# Patient Record
Sex: Male | Born: 1955 | Race: White | Hispanic: No | Smoking: Current every day smoker
Health system: Southern US, Community
[De-identification: ages and names within clinical notes are randomized; demographics above are authoritative.]

## PROBLEM LIST (undated history)

## (undated) DIAGNOSIS — F1010 Alcohol abuse, uncomplicated: Secondary | ICD-10-CM

## (undated) DIAGNOSIS — K551 Chronic vascular disorders of intestine: Secondary | ICD-10-CM

## (undated) DIAGNOSIS — C801 Malignant (primary) neoplasm, unspecified: Secondary | ICD-10-CM

## (undated) DIAGNOSIS — Z720 Tobacco use: Secondary | ICD-10-CM

## (undated) DIAGNOSIS — Z95828 Presence of other vascular implants and grafts: Secondary | ICD-10-CM

## (undated) DIAGNOSIS — Z923 Personal history of irradiation: Secondary | ICD-10-CM

## (undated) HISTORY — DX: Chronic vascular disorders of intestine: K55.1

## (undated) HISTORY — DX: Presence of other vascular implants and grafts: Z95.828

## (undated) HISTORY — PX: CARDIAC SURGERY: SHX584

## (undated) HISTORY — DX: Tobacco use: Z72.0

---

## 2000-07-29 ENCOUNTER — Emergency Department (HOSPITAL_COMMUNITY): Admission: EM | Admit: 2000-07-29 | Discharge: 2000-07-29 | Payer: Self-pay | Admitting: *Deleted

## 2002-04-04 ENCOUNTER — Emergency Department (HOSPITAL_COMMUNITY): Admission: EM | Admit: 2002-04-04 | Discharge: 2002-04-04 | Payer: Self-pay | Admitting: Emergency Medicine

## 2002-04-04 ENCOUNTER — Encounter: Payer: Self-pay | Admitting: Emergency Medicine

## 2007-03-14 ENCOUNTER — Emergency Department (HOSPITAL_COMMUNITY): Admission: EM | Admit: 2007-03-14 | Discharge: 2007-03-14 | Payer: Self-pay | Admitting: Emergency Medicine

## 2007-07-18 ENCOUNTER — Inpatient Hospital Stay (HOSPITAL_COMMUNITY): Admission: EM | Admit: 2007-07-18 | Discharge: 2007-08-05 | Payer: Self-pay | Admitting: Emergency Medicine

## 2007-07-18 ENCOUNTER — Encounter (INDEPENDENT_AMBULATORY_CARE_PROVIDER_SITE_OTHER): Payer: Self-pay | Admitting: Emergency Medicine

## 2007-07-18 ENCOUNTER — Ambulatory Visit: Payer: Self-pay | Admitting: Infectious Diseases

## 2007-07-18 ENCOUNTER — Ambulatory Visit: Payer: Self-pay | Admitting: Vascular Surgery

## 2007-07-18 ENCOUNTER — Encounter: Payer: Self-pay | Admitting: Infectious Diseases

## 2007-07-29 ENCOUNTER — Encounter: Payer: Self-pay | Admitting: Vascular Surgery

## 2007-08-13 ENCOUNTER — Ambulatory Visit: Payer: Self-pay | Admitting: Vascular Surgery

## 2007-08-19 ENCOUNTER — Ambulatory Visit: Payer: Self-pay | Admitting: Internal Medicine

## 2007-08-20 ENCOUNTER — Ambulatory Visit: Payer: Self-pay | Admitting: Vascular Surgery

## 2007-08-20 ENCOUNTER — Ambulatory Visit: Payer: Self-pay | Admitting: *Deleted

## 2008-01-28 ENCOUNTER — Ambulatory Visit: Payer: Self-pay | Admitting: Vascular Surgery

## 2008-12-23 IMAGING — CR DG CHEST 1V PORT
1 series · 1 of 1 positions shown · non-contrast
Comparison: 07/20/07.

CLINICAL DATA: Swan-Ganz catheter placement/post aortic bypass.
 PORTABLE CHEST - 1 VIEW - 07/29/07:

[view not recorded]
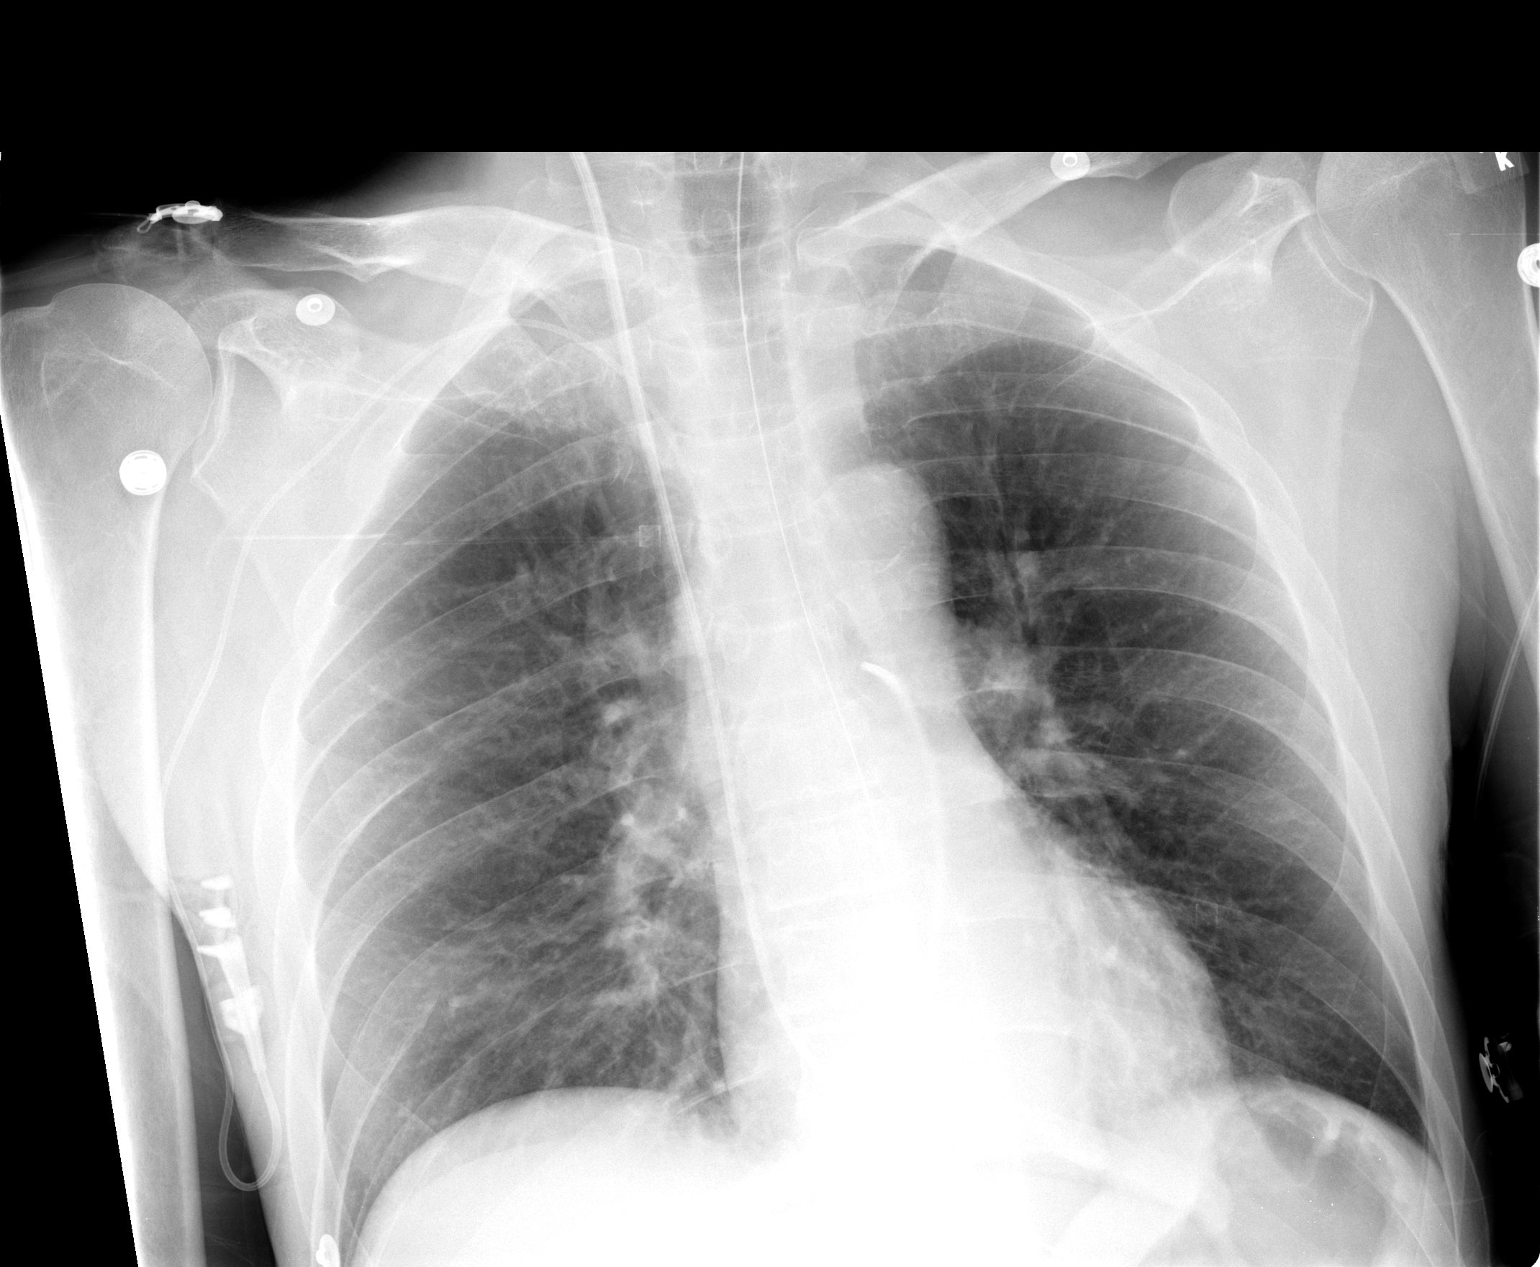

[1 of 1 positions shown; findings below may reference images not displayed]

FINDINGS: The heart and lungs remain normal.  A Swan-Ganz catheter has been placed from the right jugular vein. The tip is in the proximal right pulmonary artery.  A right upper extremity PICC is also in place with its tip in the proximal SVC.  
 No pneumothorax.  The lungs are clear.
IMPRESSION: 1.  Swan-Ganz catheter to the proximal right pulmonary artery.
 2.  No pneumothorax.
 3.  Lungs clear.

## 2008-12-24 IMAGING — CR DG CHEST 1V PORT
1 series · 1 of 1 positions shown · non-contrast
Comparison: 07/29/07.

CLINICAL DATA: AAA. Follow-up.
 PORTABLE CHEST - 1 VIEW (3023 hours):

[view not recorded]
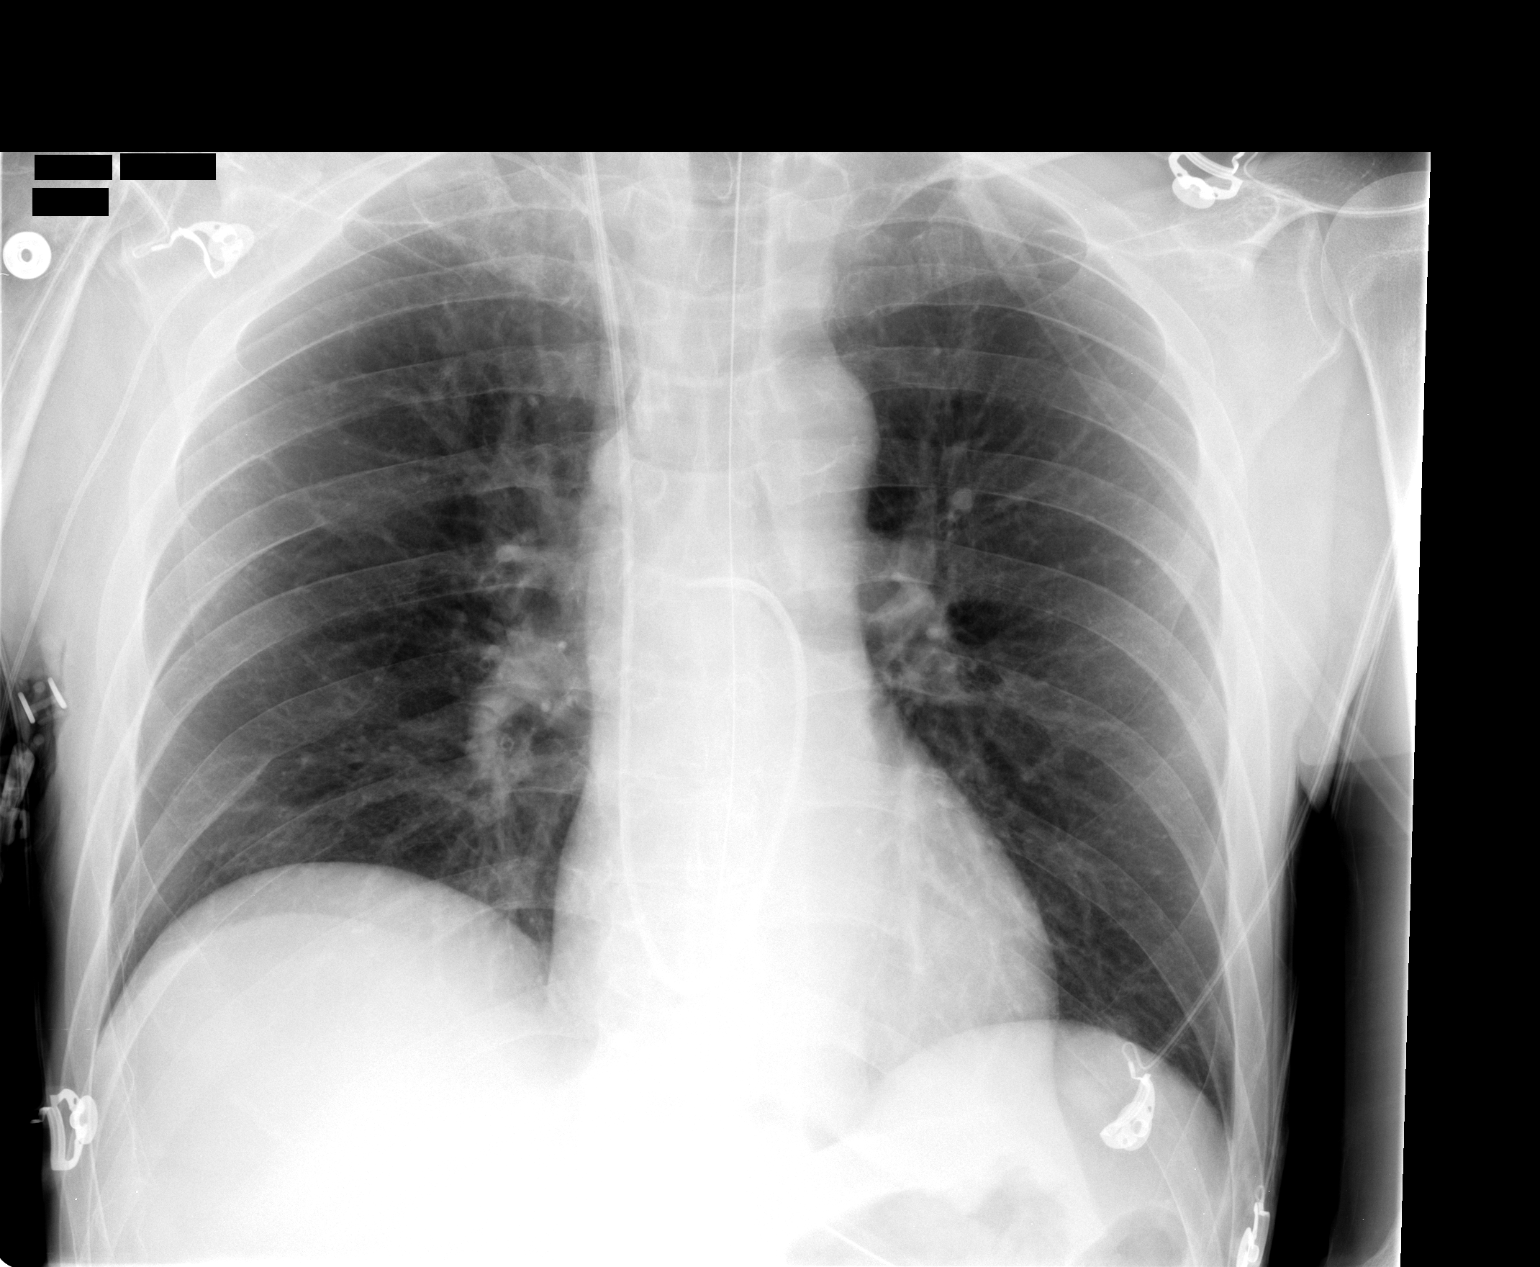

[1 of 1 positions shown; findings below may reference images not displayed]

FINDINGS: The lungs are well aerated. No pneumothorax is seen. Swan-Ganz catheter remains in the right main pulmonary artery. NG tube is present. No change in the right PICC line with the tip in the mid SVC.
IMPRESSION: Stable chest x-ray. No active lung disease. No change in Swan-Ganz catheter and right PICC line.

## 2010-10-24 NOTE — Op Note (Signed)
Brandon [name], VANDERWALL NO.:  [id number]   MEDICAL RECORD NO.:  [id number]          PATIENT TYPE:  INP   LOCATION:  4708                         FACILITY:  [facility]   PHYSICIAN:  Di Kindle. 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   PROCEDURES:  1. Aortogram.  2. Bilateral iliac arteriogram.  3. Bilateral lower extremity runoff.   SURGEON:  Di Kindle. Edilia Bo, M.D.   ANESTHESIA:  Local with sedation.   TECHNIQUE:  The patient was taken to the PV lab at [facility] and sedated with  a milligram of Versed and 50 mcg of fentanyl.  Both groins were prepped  and draped in the usual sterile fashion.  After the skin was infiltrated  with 1% lidocaine, the left common femoral artery was cannulated and a  guidewire introduced into the infrarenal the infrarenal aorta under  fluoroscopic control.  A 5-French sheath was introduced over the wire.  The dilator was removed.  A pigtail catheter was positioned at the L1  vertebral body and flush aortogram obtained.  The catheter was then  advanced 2 cm and a lateral projection was obtained and some oblique  projections were obtained of the proximal mesenteric and renal vessels.  Next the catheter was pulled down into the infrarenal aorta and  aortogram with runoff was obtained.   FINDINGS:  The celiac axis appears to be occluded at its origin and  there is reconstitution past this short-segment occlusion.  The superior  mesenteric artery has a proximal 90-95% stenosis over a length of  approximately 1.5 cm.  Distally there is  poststenotic dilatation and  distally the vessel was patent.  The IMA is not visualized.  Neither  hypogastric artery are visualized.  There is a 40-50% proximal left  renal artery stenosis with no significant renal artery stenosis on the  right.  The infrarenal aorta is patent.  The right common iliac artery  is occluded at its origin.  On the right side there is faint  visualization of the distal common femoral and proximal deep and  superficial femoral artery but no other visualization could be obtained  because of the proximal occlusion.   On the left side the common iliac and external iliac arteries are  patent.  The hypogastric artery is occluded on the left also.  The  superficial femoral artery is patent.  There is poor visualization of  the deep femoral artery.  There appears to be a focal plaque right at  the distal common femoral artery in the origin of the deep femoral  artery with extensive collaterals around this area, suggesting this is  chronic.  The superficial femoral artery, popliteal and proximal tibial  vessels are all patent.  The anterior tibial and  posterior tibial  arteries are patent in the foot.  There is poor visualization of the  peroneal distally.   CONCLUSIONS:  1. A 40-50% left renal artery stenosis.  2. 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  3. Short-segment occlusion of the celiac axis.  4. Occluded inferior mesenteric artery.  5. Bilateral hypogastric artery occlusions.  6. Focal plaque at the distal common femoral artery on the left.      Di Kindle. Edilia Bo, M.D.  Electronically Signed     CSD/MEDQ  D:  07/21/2007  T:  07/22/2007  Job:  [id number]

## 2010-10-24 NOTE — Assessment & Plan Note (Signed)
OFFICE VISIT   JAXTEN, BROSH  DOB:  [birth year]                                       08/20/2007  ZOXWR#:[id number]   Mr. Lamartina is seen in followup today.  He was last seen last week but  returned today to have bilateral ABIs performed today.  He recently  underwent aortobifemoral bypass with SMA bypass on February 17th.  He  states that he is continuing to do well.  His appetite has improved  somewhat.  He has no postprandial pain but his appetite is not large to  this point.  He is continuing to eat.  ABIs today were 0.92 on the right  and 0.87 on the left.  Overall, he is doing well.  He will follow up  with me in 6 months' time for repeat SMA graft surveillance and ABIs.   Janetta Hora. Fields, MD  Electronically Signed   CEF/MEDQ  D:  08/20/2007  T:  08/21/2007  Job:  [id number]

## 2010-10-24 NOTE — Assessment & Plan Note (Signed)
OFFICE VISIT   DOZIER, BERKOVICH  DOB:  08-02-1955                                       01/28/2008  ZOXWR#:[id number]   The patient is a 55 year old male who underwent aortobifemoral bypass  with aorta superior mesenteric artery bypass in February 2009.  He  presents for further follow-up today.  He denies any abdominal pain.  He  states that he really has not gained significantly weight and currently  weighs 139 pounds.  He denies any postprandial pain.  He states that he  is walking without difficulty and has essentially no claudication  symptoms.  Unfortunately, he continues to smoke 1 pack of cigarettes per  day.  He was counseled again today against this.  He also states that he  has discontinued all of his medications, I believe that most likely this  is due to financial issues.   PHYSICAL EXAM TODAY:  vital Signs:  Blood pressure is 179/119 in the  left arm, heart rate 71 and regular.  HEENT:  Unremarkable.  Neck:  Has  2+ carotid pulses without bruit.  Chest:  Clear to auscultation.  Cardiac Exam:  Regular rate and rhythm without murmur.  Abdomen:  Soft  and nontender with no bruits or masses.  Extremities:  He has 2+ femoral  pulses bilaterally, he has 2+ dorsalis pedis pulses bilaterally.  He had  bilateral ABIs performed today which were 0.92 on the right and 0.87 on  the left.  He also had a duplex of his aortic and SMA bypass grafts.  The aortobifemoral bypass graft is widely patent as well as the SMA  bypass graft.  There is no evidence of stenosis.  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   I encouraged the patient that he should resume his Pravastatin and his  lisinopril to hopefully control these risk factors and ensure the long-  term patency of his bypass graft.  Additionally, I again counseled him  against smoking.  He will follow up in six months' time for a repeat  graft scan.  He will  walk as much as possible to improve circulation in  his lower extremities.   Janetta Hora. Fields, MD  Electronically Signed   CEF/MEDQ  D:  01/29/2008  T:  01/29/2008  Job:  [id number]   cc:   Melvern Banker

## 2010-10-24 NOTE — Op Note (Signed)
Brandon [name], Brandon [name] NO.:  [id number]   MEDICAL RECORD NO.:  [id number]          PATIENT TYPE:  INP   LOCATION:  2019                         FACILITY:  [facility]   PHYSICIAN:  Janetta Hora. Fields, MD  DATE OF BIRTH:  1956-04-16   DATE OF PROCEDURE:  07/29/2007  DATE OF DISCHARGE:  08/05/2007                               OPERATIVE REPORT   PROCEDURE:  1. Aortobifemoral bypass.  2. Aorta to the superior mesenteric artery bypass.   PREOPERATIVE DIAGNOSES:  1. Claudication with early rest pain, right foot.  2. Chronic mesenteric ischemia.   POSTOPERATIVE DIAGNOSES:  1. Claudication with early rest pain, right foot.  2. Chronic mesenteric ischemia.   ANESTHESIA:  General.   ASSISTANT:  Shonna Chock, P.A.-C., Evern Bio, P.A.-C.   OPERATIVE FINDINGS:  1. A 14 x 8-mm Dacron bifurcated graft for aortobifemoral bypass.  2. An 8-mm Dacron graft from aortic graft to superior mesenteric      artery.   OPERATIVE DETAILS:  After obtaining informed consent, the patient was  taken to the operating room.  The patient was placed in the supine  position on the operating room table.  After induction of general  anesthesia and endotracheal intubation, the patient's abdomen and lower  extremity were prepped and draped in the usual sterile fashion.  Next, a  longitudinal incision was made over the right common femoral artery in  the right groin.  Incision was carried down through subcutaneous  tissues, down to the level of the artery.  The artery was fairly small,  and there was no pulse present.  The common femoral artery was fairly  long.  This was dissected free circumferentially.  Dissection was  carried down to the level of femoral bifurcation.  Vessel loops were  then placed around the artery.  Retroperitoneal tunnel was started in  the right groin by tunneling up onto the inguinal ligament into the  external iliac artery area.   Attention was then turned to the  left groin.  A longitudinal incision  was made over the left common femoral artery in a similar fashion.  There was also a long common femoral artery on this side.  A pulse was  present on the left side.  The artery was again dissected free  circumferentially, and retroperitoneal tunnel was begun just underneath  the inguinal ligament.  A midline laparotomy incision was then made  extending from the xiphoid to the pubis.  Incision was carried down  through subcutaneous tissues.  The fascia peritoneum were incised for  the full length of the incision.  Next, a small bowel was inspected and  found to be viable with no areas of obvious ischemia.  Small bowel was  reflected to the right colon and the transverse colon and omentum were  reflected superiorly.  An Omni retractor was brought up in the operative  field for assistance in retraction.  The retroperitoneum was entered.  The aorta was dissected free on its anterior surface all the way up to  the level of the left and right renal arteries.  The inferior mesenteric  artery was dissected free circumferentially and a vessel loop placed  around this.  Retroperitoneal tunnels were then made connecting the  abdominal incision retroperitoneal and retroureteral down to the groin  incisions on both sides, and umbilical tapes were placed through these  retroperitoneal tunnels.  The patient was then given 5000 units of  intravenous heparin.  The aorta was clamped in a side-biting fashion  just below the renal arteries.  Longitudinal aortotomy was made.  A 14 x  8-mm Dacron graft was then brought up in the operative field and  beveled.  This was then sewn end of graft to side of aorta using a  running 3-0 Prolene suture.  Just prior to completion of anastomosis,  this was fore bled, back bled and thoroughly flushed.  Anastomosis was  secured.  Clamp was released restoring flow to the native aortic  circulation distally.  There was a brief pressure  drop, and this quickly  recovered.  It should be noted that the inferior mesenteric artery was  left in place and was a few centimeters below the proximal aortic  anastomosis.  Next, the left and right limbs of the graft were brought  down through the retroperitoneal tunnels to each groin.  Attention was  then turned to the right common femoral anastomosis.  Right common  femoral artery was clamped proximally with a small Cooley clamp and  distally with a fistula clamp.  A longitudinal opening was made in the  right common femoral artery.  The artery was fairly soft in character.  Graft was pulled taut to length and beveled.  It was then sewn end graft  to side of artery using a running 5-0 Prolene suture.  Just prior to  completion of anastomosis was fore bled, back bled, and thoroughly  flushed.  Anastomosis was secured.  Clamp was released.  Flow was  restored to the right leg.  There was a transient brief pressure drop,  and this quickly recovered.  Attention was then turned to the left leg.  The left limb of the graft had already been brought through the  retroperitoneal tunnel.  The left common femoral artery was clamped  proximally and distally as on the right side.  A longitudinal opening  was made in the left common femoral artery.  This was also was fairly  smooth in character.  The graft was beveled and cut to length.  This was  then sewn end graft to side of artery using running 5-0 Prolene suture.  In a similar fashion, this was fore bled, back bled, and thoroughly  flushed.  After anastomosis was secured, clamp was released.  There was  a brief pressure drop which quickly recovered.  Hemostasis was obtained  in both groins.  Attention was then turned to the abdomen once again.  The superior mesenteric artery was dissected free by taking down the  ligament of Treitz all the way back to the proximal portion of superior  mesenteric artery.  The artery was approximately 3-4 mm in  diameter.  It  was dissected free circumferentially as well as several side branches  which were controlled with vessel loops.  The patient was then given an  additional 5000 units of heparin.  The SMA was controlled proximally  with a fistula clamp and distally with vessel loops.  Longitudinal  opening was made in superior mesenteric artery.  An 8-mm Dacron graft  was brought up in the operative field and sewn of end graft to side  of  artery using a running 6-0 Prolene suture.  At completion of  anastomosis, this was thoroughly back bled to flush.  Graft was then cut  to length and was approximately 3 cm in total length.  A side-biting  clamp was then placed again on the proximal portion of the aortic graft.  A 5-mm punch was reused to create a large opening in the proximal  portion of the aortic graft.  An 8-mm graft was then sewn into the  aortic graft using a running 5-0 Prolene suture.  Just prior to  completion of anastomosis, everything was fore bled, back bled, and  thoroughly flushed.  Anastomosis was secured, clamps released.  There  was good pulsatile flow in the distal superior mesenteric artery  immediately.  Hemostasis was obtained at all suture lines.  Next, the  retroperitoneum was closed with a running 3-0 Prolene suture.  The  retroperitoneal tissues were fairly thin in some areas.  Therefore, a  piece of bovine pericardium was used to cover the portions of the graft  that could not be covered with retroperitoneal tissue.  This was  primarily at the upper third portion of the graft.  Next, the viscera  returned back to normal position.  There were good pulses all the way  out into the mesentery, and small bowel was pink and viable in color.  The sigmoid colon was also pink and normal in color.  The fascia was  then reapproximated using running #1 PDS suture.  The skin of the  abdomen was closed with staples.  Each groin was then closed in multiple  layers of running 2-0  and 3-0 Vicryl suture.  The skin of both groins  were closed with staples.  The patient tolerated procedure well, and  there were no  complications.  Instrument, sponge, and needle counts were correct at  the end of the case.  The patient had palpable pulses in both feet at  the end of the case.  The patient was extubated in the operating room  and taken to the recovery room in stable condition.      Janetta Hora. Fields, MD  Electronically Signed     CEF/MEDQ  D:  08/05/2007  T:  08/06/2007  Job:  [id number]

## 2010-10-24 NOTE — Discharge Summary (Signed)
[name], [name] NO.:  [id number]   MEDICAL RECORD NO.:  [id number]          PATIENT TYPE:  INP   LOCATION:  2019                         FACILITY:  [facility]   PHYSICIAN:  Janetta Hora. 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   SECONDARY DISCHARGE DIAGNOSES:  1. Peripheral vascular disease.  2. Hernia repair.  3. Smoker one pack per day   CONSULTATIONS:  1. Internal medicine on July 19, 2007.  2. Nutrition July 19, 2007.  3. Fetal cardiology on July 19, 2007.  4. Pharmacy July 19, 2007.  5. Physical therapy on August 01, 2007.   PROCEDURES:  1. An aortogram  2. Bilateral iliac arteriogram.  3. Bilateral lower extremity runoff.   All three done by Dr. Edilia Bo on July 21, 2007.   #4.  An aortobifemoral bypass using 14 x 18 mm Hemashield Dacron graft  by Dr. Darrick Penna on July 18, 2007.  #5.  Aorta to SMA bypass using 8-mm Hemashield Dacron graft by Dr.  Fabienne Bruns on July 18, 2007.   HISTORY AND PHYSICAL:  On July 18, 2007, Dr. Darrick Penna was called by the  ER to evaluate the patient for right lower extremity ischemia.  The  patient came to the ER this day also complaining of abdominal pain.  It  was noted that the patient had absent pulses in his right leg.  The  patient had a known chronic right iliac artery occlusion since October  2008.  The abdominal pain has been occurring for 4-5 months now.  The  patient also has experienced about a 30-pound weight loss in the last 4  months.  There is no consistent pain, but it is  constant with eating.  The patient seems to get pain after walking about 30 yards in the right  leg.  He had no fever, chills, nausea or vomiting at this time.  After  examination, assessment and plan this time felt the patient had chronic  ischemia right foot secondary to iliac occlusion.  Weight loss,  abdominal pain related to possibly mesenteric ischemia.  Plan at this  time was to do a CT angiogram of the abdomen and pelvis and make  decisions from there.  The patient agreed to this plan.  ABIs also were  completed on July 18, 2007. The right leg was 0.24 and on the left  0.72.  It did show the right indicated severe reduction in arterial  flow; in the left indicated moderate reduction in arterial flow.  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  The patient also had iliac and SMA stenoses  which are also  probable chronic based on his symptoms and CT from October.  The patient  would likely need an aortobifemoral bypass and SMA bypass.  This would  be scheduled later on after the patient is seen by Dr. Edilia Bo on  Monday, July 21, 2007, for an angiogram.  At this time, kept the  patient n.p.o., and we started TPN.   Cardiology also was consulted to do a followup with the patient and  assess for possible preop risk assessment.  On July 20, 2007, the  patient's results from the stress test indicated that the patient could  go through with procedures, and the patient was scheduled for a  mesenteric angiogram in the morning with Dr. Edilia Bo.  Also continued  TPN at this time.   On July 21, 2007, the patient tolerated the aortogram The patient did  have a possible SMA stenosis, proximal iliac occluded, and also IMA  occluded.  The patient also had bilateral hypogastric arterial  occlusion.  After Dr. Darrick Penna reviewed this,  he felt that the patient  had a  three-vessel mesenteric disease with severe aortoiliac disease.  Dr. Darrick Penna did agree that the patient needed an aortobifemoral and SMA  bypass.  This was scheduled for next Tuesday on July 29, 2007.  The  patient has been pain free at this time. We will continue to restart the  diet until surgery.   On July 22, 2007, the patient stated the pain had increased on that  day, but he was able to eat breakfast but no lunch due to fear of pain.  The patient was having bowel movements this day, and we continued to  resume diet along with supportive TNA.  The plan for surgery was still  scheduled for July 29, 2007.  During this time, we continued to  monitor the patient with no significant change, and on July 29, 2007, the patient was taken to the OR to proceed with procedure by Dr.  Darrick Penna.   HOSPITAL COURSE:  On July 29, 2007, the patient was taken to the OR  primary room in [facility] to proceed with an aortobifemoral  bypass and SMA bypass by Dr. Darrick Penna on July 29, 2007.  The patient  tolerated this procedure with no complications and was  transferred to  PACU and continued to remain stable.  The patient was transferred later  to the floor where he continued to remain stable throughout the night.  The patient did later that afternoon have some mild abdominal pain, but  it was adequate for postop.  The patient did have 2+ pedal pulses.  We  continued to manage pain.   Postop day #1, the patient continued to remain afebrile with some  complaints of pain.  Vital signs were stable.  Abdominal dressings were  in place.  Extremities were well perfused with a 2-3+ dorsalis pedis  pulse.  TNA was continuous.  At this time we would discontinue the Swan  and sleeve and start ambulating to chair slowly.  On July 31, 2007,  the patient was improving, so we felt that it was best to go ahead and  transfer and the floor 2000.  The patient was still on n.p.o. but  was  consuming some ice.  On physical examination, the patient continued to  have a soft abdomen.  Extremities had 2+ dorsalis pedis pulse  bilaterally.  The patient's vital signs were stable, and he was  afebrile.  The patient also stated  that his abdominal pain had  decreased, and he was up in chair.   Postop day #4, August 02, 2007, the patient continued to remain  comfortable.  Vital signs stable and afebrile.  The patient's lungs were  clear. The abdomen had normal-pitch good bowel sounds.  Incisions looked  okay, and there were palpable dorsalis pedis pulses.  Physical therapy  was working with the patient to increase mobility.  Felt at this time we  could continue to advance diet and start weaning off the TNA.  The  patient was making steady progress.   Postop day #5, the patient did complain of some incisional pain but no  postprandial pain.  The patient continued to remain afebrile, and vital  signs were stable.  Abdomen was soft with bowel sounds on physical  examination.  Lungs were clear, and incisions looked okay on the  abdomen.  Feet were warm.  It was noticed that his white blood count was  at 12.1 and continued to dwindle down.  The patient was still requiring  pain medications IV but was tolerating diet.  Hopefully the patient will  go home soon with controlled pain on the oral medications.   On postop day #6, August 04, 2007, the patient continued to remain  stable. Vital signs showed blood pressure was 119/77, heart rate 81,  respirations 18, temperature 97.5, and O2 is at 98% on room air.  Magnesium was at 2.0, phosphorus at 3.9. Triglycerides 59.  Sodium 137,  potassium 3.6, chloride 103, bicarb 28, BUN 7, creatinine 0.61, glucose  75.  Upon entering the room, the patient stated he was still having some  soreness at the incision site, but again no postprandial pain.  The  patient was alert and oriented x3 in bed eating.  The abdomen was soft,  nondistended  with active bowel sounds.  It was tender, but incision was  clean with a little bit of mild erythema at incision site.  Feet were  warm and pink with 2+ dorsalis pedis pulse.  Plan at this time was to  continue to increase ambulation, discontinue IV pain medications and use  oral pain medications only.  Continue to increase to increase intake.  Plan is for the patient to discharge to home in the morning as long as  he can tolerate pain with oral medications.  The patient stated he  agreed to this plan and hopes for patient to go home.   DISCHARGE MEDICATIONS:  1. Aspirin 325 mg 1 tablet daily.  2. Lipitor 10 mg daily.  3. Protonix 40 mg daily with meals.  4. Ensure x2 daily between meals.  5. Percocet 5/325 mg 1-2 tablets every 6 hours as needed for pain.   These prescriptions were written for the patient by [place] but also we had  consulted health services for [facility]. She has followup  with the patient.  The patient does have an appointment on March 10,  Tuesday, at 11:30.   DISCHARGE INSTRUCTIONS:  Discussed discharge instructions with the  patient to go home on August 05, 2007.  The patient was instructed to  continue to follow a diabetic diet.  Clean gently with soap and water  the abdomen.  Increase activity slowly and walk with assistance and  shower.  No lifting or driving for 3 weeks.  Contact if increased  redness, drainage, swelling, fever greater than 101, severe abdominal  pain or diffuse swelling in leg.  The patient is to follow up  with Dr.  Darrick Penna and 3 weeks with followup ABIs.  Office will contact to schedule.  The patient also is to follow up with the office in 2 weeks for staple  removal, and the patient will contact the office to have scheduled.  The  patient also is going to be followed up by [facility] on August 19, 2007, at 11:30 a.m. to follow up on Lipitor, Protonix and nutrition.  Also they will follow diabetes.  The patient agreed  with this plan and  that he would be compliant and follow up with Health Services on this  day.  The patient agrees to plan for discharge to home in the morning on  August 04, 2007, as long as the patient continues to remain stable and  afebrile and also as long as the patient can tolerate pain on oral pain  medications.      Cyndy Freeze, PA      Janetta Hora. Fields, MD  Electronically Signed    ALW/MEDQ  D:  08/04/2007  T:  08/04/2007  Job:  [id number]

## 2010-10-24 NOTE — Procedures (Signed)
BYPASS GRAFT EVALUATION   INDICATION:  Followup, aortobifem and aorta-to-SMA bypass graft.   HISTORY:  Diabetes:  No.  Cardiac:  No.  Hypertension:  Yes.  Smoking:  Yes.  Previous Surgery:  Aortobifem and aorta-to-SMA bypass grafts on  07/29/07.   SINGLE LEVEL ARTERIAL EXAM                               RIGHT              LEFT  Brachial:                    168                172  Anterior tibial:             164                148  Posterior tibial:            148                150  Peroneal:  Ankle/brachial index:        0.95               0.87   PREVIOUS ABI:  Date: 08/20/07  RIGHT:  0.92  LEFT:  0.87   LOWER EXTREMITY BYPASS GRAFT DUPLEX EXAM:   DUPLEX:  1. Biphasic to monophasic waveforms noted throughout the aortobifem      bypass grafts and their native vessels with no evidence of      increased velocities noted.  2. Patent aorta-to-SMA bypass graft with no increase in velocities      noted.  3. Elevated velocity of 231 cm/s noted in the proximal celiac artery.   IMPRESSION:  1. Patent aortobifemoral and aorta-to-superior mesenteric artery      bypass grafts with no evidence of stenosis noted.  2. 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  3. Stable bilateral ankle brachial indices noted.  4. No significant change noted since the previous examinations on      08/13/07 and 08/20/07.   ___________________________________________  Janetta Hora. Fields, MD   CH/MEDQ  D:  01/28/2008  T:  01/28/2008  Job:  [id number]

## 2010-10-24 NOTE — Procedures (Signed)
MESENTERIC ARTERIAL DUPLEX EVALUATION   INDICATION:  Follow up aortobifemoral bypass, SMA bypass graft.   HISTORY:  Diabetes:  No.  Cardiac:  No.  Hypertension:  Yes.  Smoking:  Yes.   Mesenteric Duplex Findings:  Aorta - Proximal                            82  Aorta - Mid                                 77  Aorta - Distal                              49   Celiac Trunk - Proximal                     234  Celiac Trunk - Distal   Hepatic Artery  Splenic Artery   Superior Mesenteric Artery-Origin           180  Superior Mesenteric Artery-Proximal         150  Superior Mesenteric Artery-Mid              195  Superior Mesenteric Artery-Distal           116   Inferior Mesenteric Artery-Proximal   IMPRESSION:  1. 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  2. Patent superior mesenteric artery bypass graft with no evidence of      focal stenosis.   ___________________________________________  Janetta Hora Fields, MD   MG/MEDQ  D:  08/13/2007  T:  08/13/2007  Job:  [id number]

## 2010-10-24 NOTE — Assessment & Plan Note (Signed)
OFFICE VISIT   Brandon [name], Brandon [name]  DOB:  August 26, 1955                                       08/13/2007  ZOXWR#:[id number]   [name] is a 55 year old male who underwent aortobifemoral bypass  grafting with aorta superior mesenteric artery bypass on February 17th.  He has recovered well from this.  He no longer has any postprandial  pain.  His appetite has continued to improve.  He has yet to gain any  weight.  Overall, he is feeling better.   PHYSICAL EXAM:  Today, blood pressure is 143/102 in the left arm.  Pulse  is 106 and regular.  Abdomen:  Has a well-healed midline laparotomy  incision, there is no evidence of hernia.  He has abdominal bruits  throughout the abdomen.  He has 2+ femoral and dorsalis pedis pulses  bilaterally.  Groin incisions are well healed bilaterally.  He has no  claudication symptoms at this point.  His weight today was 123.9 pounds.  He also had a mesenteric Duplex scan today which showed 60% stenosis of  the celiac origin and a patent superior mesenteric artery bypass with no  stenosis.   Overall, Brandon [name] is doing well.  He is scheduled to go to [facility]  to attempt to get Medicaid as well as assist in his overall primary care  and blood pressure medications.  He will follow up in 6 months' time  with a repeat mesenteric Duplex and an aortic ultrasound with ABIs and  velocities across his anastomoses at that time.   Brandon Hora. Fields, MD  Electronically Signed   CEF/MEDQ  D:  08/13/2007  T:  08/14/2007  Job:  [id number]   cc:   Brandon [name]

## 2010-10-24 NOTE — Procedures (Signed)
LOWER EXTREMITY ARTERIAL EVALUATION-SINGLE LEVEL   INDICATION:  Followup, AFBG and aorto-SMA bypass graft on 07/29/07.   HISTORY:  Diabetes:  No.  Cardiac:  No.  Hypertension:  Yes.  Smoking:  Yes.  Previous Surgery:  AFBG, aorto-SMA bypass graft on 07/29/07 by Dr.  Darrick Penna.   RESTING SYSTOLIC PRESSURES: (ABI)                          RIGHT                LEFT  Brachial:               120                  110  Anterior tibial:        100                  104  Posterior tibial:       110 (0.92)           104 (0.87)  Peroneal:  DOPPLER WAVEFORM ANALYSIS:  Anterior tibial:        Biphasic             Triphasic  Posterior tibial:       Triphasic            Triphasic  Peroneal:   PREVIOUS ABI'S:  Date: 07/18/07  RIGHT:  0.24  LEFT:  0.72   IMPRESSION:  Ankle brachial indices are improved bilaterally.   ___________________________________________  Janetta Hora Fields, MD   DP/MEDQ  D:  08/20/2007  T:  08/20/2007  Job:  [id number]

## 2011-03-02 LAB — APTT
aPTT: 38 — ABNORMAL HIGH
aPTT: 32

## 2011-03-02 LAB — CBC
HCT: 32.6 — ABNORMAL LOW
HCT: 32.8 — ABNORMAL LOW
HCT: 43.7
HCT: 27.9 — ABNORMAL LOW
HCT: 29.2 — ABNORMAL LOW
HCT: 32.3 — ABNORMAL LOW
HCT: 33.3 — ABNORMAL LOW
Hemoglobin: 11 — ABNORMAL LOW
Hemoglobin: 11.4 — ABNORMAL LOW
Hemoglobin: 14.6
Hemoglobin: 10.9 — ABNORMAL LOW
Hemoglobin: 11.3 — ABNORMAL LOW
Hemoglobin: 9.6 — ABNORMAL LOW
Hemoglobin: 9.9 — ABNORMAL LOW
MCHC: 34
MCHC: 33.7
MCHC: 33.8
MCHC: 33.9
MCHC: 34.5
MCV: 91.2
MCV: 91.2
MCV: 92.2
MCV: 91.3
MCV: 91.9
MCV: 92.2
MCV: 92.3
MCV: 92.3
Platelets: 290
Platelets: 331
Platelets: 357
Platelets: 269
Platelets: 321
Platelets: 370
Platelets: 375
Platelets: 386
Platelets: 432 — ABNORMAL HIGH
RBC: 3.54 — ABNORMAL LOW
RBC: 3.66 — ABNORMAL LOW
RBC: 3.23 — ABNORMAL LOW
RBC: 3.34 — ABNORMAL LOW
RBC: 3.92 — ABNORMAL LOW
RDW: 14
RDW: 13.9
RDW: 14
RDW: 14.2
RDW: 14.2
WBC: 10
WBC: 16.6 — ABNORMAL HIGH
WBC: 9.9
WBC: 11.3 — ABNORMAL HIGH
WBC: 13.4 — ABNORMAL HIGH
WBC: 13.5 — ABNORMAL HIGH

## 2011-03-02 LAB — BASIC METABOLIC PANEL
BUN: 8
BUN: 9
BUN: 8
BUN: 9
CO2: 27
CO2: 24
CO2: 28
Calcium: 9.1
Chloride: 100
Chloride: 102
Chloride: 103
Creatinine, Ser: 0.6
Creatinine, Ser: 0.61
Creatinine, Ser: 0.6
GFR calc non Af Amer: >60
GFR calc non Af Amer: >60
GFR calc non Af Amer: >60
Glucose, Bld: 112 — ABNORMAL HIGH
Glucose, Bld: 112 — ABNORMAL HIGH
Glucose, Bld: 109 — ABNORMAL HIGH
Glucose, Bld: 141 — ABNORMAL HIGH
Potassium: 3.7
Potassium: 3.7
Potassium: 3.8
Potassium: 3.9
Sodium: 135
Sodium: 136

## 2011-03-02 LAB — COMPREHENSIVE METABOLIC PANEL
ALT: 10
ALT: 20
ALT: <8
ALT: <8
ALT: 24
ALT: 27
ALT: 45
ALT: 99 — ABNORMAL HIGH
AST: 11
AST: 11
AST: 12
AST: 22
AST: 18
AST: 19
AST: 38 — ABNORMAL HIGH
Albumin: 2.5 — ABNORMAL LOW
Albumin: 2.6 — ABNORMAL LOW
Albumin: 2.7 — ABNORMAL LOW
Albumin: 3.6
Albumin: 1.8 — ABNORMAL LOW
Albumin: 2.8 — ABNORMAL LOW
Alkaline Phosphatase: 59
Alkaline Phosphatase: 62
Alkaline Phosphatase: 95
Alkaline Phosphatase: 67
Alkaline Phosphatase: 89
BUN: 12
BUN: 5 — ABNORMAL LOW
BUN: 7
CO2: 23
CO2: 27
CO2: 28
CO2: 28
CO2: 24
CO2: 28
CO2: 28
Calcium: 8.6
Calcium: 8.5
Calcium: 8.6
Calcium: 9.5
Chloride: 102
Chloride: 102
Chloride: 96
Chloride: 99
Chloride: 103
Chloride: 103
Chloride: 99
Creatinine, Ser: 0.57
Creatinine, Ser: 0.6
Creatinine, Ser: 0.61
Creatinine, Ser: 0.63
Creatinine, Ser: 0.64
Creatinine, Ser: 0.68
GFR calc Af Amer: >60
GFR calc Af Amer: >60
GFR calc Af Amer: >60
GFR calc Af Amer: >60
GFR calc non Af Amer: >60
GFR calc non Af Amer: >60
GFR calc non Af Amer: >60
GFR calc non Af Amer: >60
GFR calc non Af Amer: >60
GFR calc non Af Amer: >60
GFR calc non Af Amer: >60
Glucose, Bld: 110 — ABNORMAL HIGH
Glucose, Bld: 75
Glucose, Bld: 88
Potassium: 3.5
Potassium: 4
Potassium: 4.1
Potassium: 3.6
Potassium: 3.8
Sodium: 128 — ABNORMAL LOW
Sodium: 132 — ABNORMAL LOW
Sodium: 138
Sodium: 131 — ABNORMAL LOW
Sodium: 137
Total Bilirubin: 0.4
Total Bilirubin: 0.4
Total Bilirubin: 0.6
Total Bilirubin: 0.6
Total Bilirubin: 1.4 — ABNORMAL HIGH
Total Bilirubin: 0.4
Total Bilirubin: 0.5
Total Bilirubin: 0.5
Total Protein: 7.3
Total Protein: 4.8 — ABNORMAL LOW
Total Protein: 5.2 — ABNORMAL LOW
Total Protein: 5.3 — ABNORMAL LOW

## 2011-03-02 LAB — URINE MICROSCOPIC-ADD ON

## 2011-03-02 LAB — DIFFERENTIAL
Basophils Absolute: 0.2 — ABNORMAL HIGH
Basophils Absolute: 0.2 — ABNORMAL HIGH
Basophils Relative: 1
Basophils Relative: 2 — ABNORMAL HIGH
Eosinophils Absolute: 0.2
Eosinophils Absolute: 0.3
Eosinophils Relative: 1
Eosinophils Relative: 3
Lymphocytes Relative: 24
Monocytes Absolute: 1.6 — ABNORMAL HIGH
Monocytes Absolute: 1.4 — ABNORMAL HIGH
Monocytes Relative: 9
Neutro Abs: 10.7 — ABNORMAL HIGH

## 2011-03-02 LAB — CROSSMATCH
ABO/RH(D): O POS
Antibody Screen: NEGATIVE

## 2011-03-02 LAB — LACTIC ACID, PLASMA
Lactic Acid, Venous: 0.7
Lactic Acid, Venous: 1
Lactic Acid, Venous: 1.1
Lactic Acid, Venous: 1.3
Lactic Acid, Venous: 1.3
Lactic Acid, Venous: 1.4

## 2011-03-02 LAB — PREALBUMIN
Prealbumin: 9.9 — ABNORMAL LOW
Prealbumin: 8.3 — ABNORMAL LOW

## 2011-03-02 LAB — POCT CARDIAC MARKERS: Troponin i, poc: <0.05

## 2011-03-02 LAB — URINALYSIS, ROUTINE W REFLEX MICROSCOPIC
Glucose, UA: NEGATIVE
Ketones, ur: 40 — AB
Leukocytes, UA: NEGATIVE
Nitrite: NEGATIVE
Specific Gravity, Urine: 1.019
pH: 6

## 2011-03-02 LAB — POCT I-STAT 7, (LYTES, BLD GAS, ICA,H+H)
Acid-Base Excess: 2
Bicarbonate: 26.5 — ABNORMAL HIGH
Bicarbonate: 27 — ABNORMAL HIGH
Hemoglobin: 8.2 — ABNORMAL LOW
Operator id: 114401
Potassium: 4.1
Sodium: 134 — ABNORMAL LOW
TCO2: 28
TCO2: 28
pCO2 arterial: 41.8
pH, Arterial: 7.409

## 2011-03-02 LAB — BLOOD GAS, ARTERIAL
Acid-base deficit: 0.1
Bicarbonate: 24.1 — ABNORMAL HIGH
Drawn by: 24486
O2 Saturation: 98.3
TCO2: 26.3
pCO2 arterial: 43.3
pH, Arterial: 7.379
pO2, Arterial: 121 — ABNORMAL HIGH

## 2011-03-02 LAB — BASIC METABOLIC PANEL WITH GFR
BUN: 8
CO2: 28
Calcium: 9.1
Chloride: 104
Creatinine, Ser: 0.6
GFR calc non Af Amer: >60
Glucose, Bld: 97
Potassium: 4.1
Sodium: 138

## 2011-03-02 LAB — PHOSPHORUS
Phosphorus: 3.7
Phosphorus: 4.1
Phosphorus: 4.7 — ABNORMAL HIGH
Phosphorus: 4.7 — ABNORMAL HIGH
Phosphorus: 4.8 — ABNORMAL HIGH

## 2011-03-02 LAB — MAGNESIUM
Magnesium: 2.2
Magnesium: 1.9
Magnesium: 2

## 2011-03-02 LAB — RAPID URINE DRUG SCREEN, HOSP PERFORMED
Amphetamines: NOT DETECTED
Benzodiazepines: NOT DETECTED
Tetrahydrocannabinol: POSITIVE — AB

## 2011-03-02 LAB — POCT I-STAT 4, (NA,K, GLUC, HGB,HCT)
Operator id: 114401
Potassium: 4.4
Sodium: 137

## 2011-03-02 LAB — LIPID PANEL
Triglycerides: 62
VLDL: 12

## 2011-03-02 LAB — BILIRUBIN, FRACTIONATED(TOT/DIR/INDIR): Bilirubin, Direct: 0.1

## 2011-03-02 LAB — TRIGLYCERIDES
Triglycerides: 78
Triglycerides: 56

## 2011-03-02 LAB — HDL CHOLESTEROL: HDL: 32 — ABNORMAL LOW

## 2011-03-02 LAB — PROTIME-INR: INR: 1

## 2011-03-22 LAB — CBC
HCT: 54.5 — ABNORMAL HIGH
Platelets: 308
RBC: 5.52
WBC: 12.7 — ABNORMAL HIGH

## 2011-03-22 LAB — URINALYSIS, ROUTINE W REFLEX MICROSCOPIC
Glucose, UA: NEGATIVE
Ketones, ur: 40 — AB
Leukocytes, UA: NEGATIVE
Nitrite: NEGATIVE
Protein, ur: NEGATIVE
Urobilinogen, UA: 0.2

## 2011-03-22 LAB — DIFFERENTIAL
Lymphocytes Relative: 19
Lymphs Abs: 2.4
Monocytes Relative: 8
Neutrophils Relative %: 72

## 2011-03-22 LAB — I-STAT 8, (EC8 V) (CONVERTED LAB)
BUN: <3 — ABNORMAL LOW
Chloride: 101
Glucose, Bld: 107 — ABNORMAL HIGH
Potassium: 4.3
pCO2, Ven: 26.3 — ABNORMAL LOW
pH, Ven: 7.576 — ABNORMAL HIGH

## 2011-03-22 LAB — POCT I-STAT CREATININE
Creatinine, Ser: 0.7
Operator id: 288331

## 2011-03-22 LAB — HEPATIC FUNCTION PANEL
ALT: 12
Albumin: 3 — ABNORMAL LOW
Alkaline Phosphatase: 134 — ABNORMAL HIGH
Total Bilirubin: 0.9
Total Protein: 6.8

## 2011-03-22 LAB — URINE MICROSCOPIC-ADD ON

## 2011-11-26 ENCOUNTER — Encounter (HOSPITAL_COMMUNITY): Payer: Self-pay | Admitting: *Deleted

## 2011-11-26 ENCOUNTER — Emergency Department (HOSPITAL_COMMUNITY)
Admission: EM | Admit: 2011-11-26 | Discharge: 2011-11-26 | Disposition: A | Discharge status: Home / Self Care | Payer: Self-pay | Attending: Emergency Medicine | Admitting: Emergency Medicine

## 2011-11-26 DIAGNOSIS — I7300 Raynaud's syndrome without gangrene: Secondary | ICD-10-CM | POA: Insufficient documentation

## 2011-11-26 DIAGNOSIS — Z7982 Long term (current) use of aspirin: Secondary | ICD-10-CM | POA: Insufficient documentation

## 2011-11-26 DIAGNOSIS — I739 Peripheral vascular disease, unspecified: Secondary | ICD-10-CM

## 2011-11-26 DIAGNOSIS — I2510 Atherosclerotic heart disease of native coronary artery without angina pectoris: Secondary | ICD-10-CM | POA: Insufficient documentation

## 2011-11-26 DIAGNOSIS — F17200 Nicotine dependence, unspecified, uncomplicated: Secondary | ICD-10-CM | POA: Insufficient documentation

## 2011-11-26 LAB — CBC
HCT: 41 % (ref 39.0–52.0)
MCV: 96.5 fL (ref 78.0–100.0)
RDW: 13.5 % (ref 11.5–15.5)
WBC: 7.7 10*3/uL (ref 4.0–10.5)

## 2011-11-26 LAB — DIFFERENTIAL
Eosinophils Relative: 1 % (ref 0–5)
Lymphocytes Relative: 27 % (ref 12–46)
Lymphs Abs: 2.1 10*3/uL (ref 0.7–4.0)
Monocytes Absolute: 0.8 10*3/uL (ref 0.1–1.0)

## 2011-11-26 LAB — BASIC METABOLIC PANEL
CO2: 26 mEq/L (ref 19–32)
Chloride: 103 mEq/L (ref 96–112)
Creatinine, Ser: 1.02 mg/dL (ref 0.50–1.35)
Glucose, Bld: 94 mg/dL (ref 70–99)

## 2011-11-26 NOTE — ED Provider Notes (Signed)
I saw and evaluated the patient, reviewed the resident's note and I agree with the findings and plan.  Pt with claudication type pain in legs, history of PVD, no claudication at rest. Non-toxic appearing. Advised followup with Vascular.  Sokha Craker B. Bernette Mayers, MD 11/26/11 2129

## 2011-11-26 NOTE — ED Notes (Signed)
Advised the patient we need a urine sample. 

## 2011-11-26 NOTE — ED Provider Notes (Signed)
History     CSN: [id number]  Arrival date & time 11/26/11  1430   First MD Initiated Contact with Patient 11/26/11 2039      Chief Complaint  Patient presents with  . tremors     Patient is a 56 y.o. male presenting with leg pain. The history is provided by the patient and a relative (pt's brother).  Leg Pain  The incident occurred more than 1 week ago (3 weeks ago). The incident occurred at home. There was no injury mechanism. The pain is present in the right foot and left foot. The quality of the pain is described as aching and throbbing. The pain is moderate. The pain has been constant since onset. Pertinent negatives include no numbness. Associated symptoms comments: Worse with exertion; now becoming symptomatic at rest . The symptoms are aggravated by activity. Treatments tried: hx of aorto-femoral bypass in 2009 for PAD.    Past Medical History  Diagnosis Date  . Coronary artery disease     Past Surgical History  Procedure Date  . Cardiac surgery     femoral bypass surgery    No family history on file.  History  Substance Use Topics  . Smoking status: Current Everyday Smoker  . Smokeless tobacco: Not on file  . Alcohol Use: Yes      Review of Systems  Constitutional: Negative for fever, chills, diaphoresis, activity change and appetite change.  HENT: Negative for neck pain.   Respiratory: Negative for cough, chest tightness, shortness of breath and wheezing.   Cardiovascular: Negative for chest pain, palpitations and leg swelling.  Musculoskeletal: Positive for arthralgias (bilateral foot pain, worse in toes).  Skin: Negative for rash and wound.  Neurological: Negative for dizziness, syncope, weakness, light-headedness, numbness and headaches.  Psychiatric/Behavioral: Negative for self-injury. The patient is not nervous/anxious.   All other systems reviewed and are negative.    Allergies  Review of patient's allergies indicates no known allergies.  Home  Medications   Current Outpatient Rx  Name Route Sig Dispense Refill  . ASPIRIN EC 81 MG PO TBEC Oral Take 81 mg by mouth daily.    Marland Kitchen VITAMIN D PO Oral Take 1 tablet by mouth 2 (two) times daily.    Marland Kitchen FISH OIL PO Oral Take 1 capsule by mouth 2 (two) times daily.      BP 164/96  Pulse 99  Temp 99.2 F (37.3 C) (Oral)  Resp 20  SpO2 96%  Physical Exam  Nursing note and vitals reviewed. Constitutional: He appears well-developed and well-nourished.  HENT:  Head: Normocephalic and atraumatic.  Right Ear: External ear normal.  Left Ear: External ear normal.  Nose: Nose normal.  Mouth/Throat: Oropharynx is clear and moist. No oropharyngeal exudate.  Eyes: Conjunctivae are normal. Pupils are equal, round, and reactive to light.  Neck: Normal range of motion. Neck supple.  Cardiovascular: Normal rate, regular rhythm, normal heart sounds and intact distal pulses.   Pulmonary/Chest: Effort normal and breath sounds normal. No respiratory distress. He has no wheezes. He has no rales. He exhibits no tenderness.  Abdominal: Soft. Bowel sounds are normal. He exhibits no distension and no mass. There is no tenderness. There is no rebound and no guarding.  Musculoskeletal: Normal range of motion. He exhibits no edema and no tenderness.  Neurological: He is alert. He displays normal reflexes. No cranial nerve deficit. He exhibits normal muscle tone. Coordination normal.  Skin: Skin is dry.     Psychiatric: He has a normal  mood and affect. His behavior is normal. Judgment and thought content normal.    ED Course  Procedures (including critical care time)  Labs Reviewed  BASIC METABOLIC PANEL - Abnormal; Notable for the following:    BUN 5 (*)     GFR calc non Af Amer 81 (*)     All other components within normal limits  CBC - Abnormal; Notable for the following:    MCH 34.4 (*)     All other components within normal limits  DIFFERENTIAL - Abnormal; Notable for the following:    Basophils  Relative 2 (*)     All other components within normal limits   No results found.   1. Peripheral artery disease   2. Claudication       MDM  56 yo M w/hx of peripheral artery disease and aortobifemoral bypass graft in 2009 presents for several weeks of diffuse pain of bilateral toes and progressively worsening claudication. However, DP pulses palpable bilaterally and because of diffuse nature of symptoms (not localized to any given toe) toe embolization unlikely. Will recommend pt take daily ASA, quit smoking, and f/u with vascular surgery regarding worsening claudication.   Pt also w/hx of EtOH abuse but clinical picture not c/w withdrawal. Labs unremarkable. Patient given return precautions, including worsening of signs or symptoms. Patient instructed to follow-up with primary care physician.          Clemetine Marker, MD 11/26/11 [phone]

## 2011-11-26 NOTE — ED Notes (Signed)
PT states he drinks etoh almost everyday and had a few last nite.    PT reports sleepiness and tired

## 2011-11-26 NOTE — ED Notes (Signed)
Patient is AOx4 and comfortable with his discharge instructions. 

## 2011-11-26 NOTE — ED Notes (Signed)
Pt from [facility] and here with tremors and increased weakness for one week.  History bypass sx.  190/100.  p-100, cbg-113, no pain

## 2011-11-26 NOTE — Discharge Instructions (Signed)
Femoral Popliteal Bypass Care After Refer to this sheet in the next few weeks. These instructions provide you with information on caring for yourself after your procedure. Your caregiver may also give you more specific instructions. Your treatment has been planned according to current medical practices, but problems sometimes occur. Call your caregiver if you have any problems or questions after your procedure. HOME CARE INSTRUCTIONS   Take medicines as told by your caregiver.   Clean your incision site as told by your caregiver.   Keep the area around your incision dry unless told by your caregiver. Ask your caregiver when it is okay to shower. Do not take a bath until your incision has completely healed.   Keep the area around the incision clean. This will help decrease your risk of infection. Check your incision site every day. Let your caregiver know if you see any swelling, redness, or anything leaking from the incision.   Exercise as told by your caregiver.   Avoid strenuous physical activity for the first few weeks. This is anything that requires great effort or energy.   Raise (elevate) your legs when sitting.   Ask your caregiver when you can return to work. The type of work you do will make a difference.   Ask your caregiver when it is safe to start driving.   Keep all your follow-up appointments.   For long-term success:   Control your blood pressure.   Take prescription medicines as told by your caregiver.   Manage diabetes, if you have it.   Do not smoke.   Eat healthy foods. Ask your caregiver for suggestions, or talk with a dietitian.   Get regular exercise. Talk with your caregiver before starting any new physical activity.  SEEK MEDICAL CARE IF:   You have redness and swelling around your incision site.   You continue to have pain in your leg, even after taking pain medicine.   You have any questions about your medicines.   You have nausea or vomiting.     You have abnormal bowel habits such as having a difficult time having a bowel movement (constipation) or having loose stools (diarrhea).   You feel weak and tired.   You are too tired to walk every day.  SEEK IMMEDIATE MEDICAL CARE IF:   Your leg becomes pale, cold, blue, tingly, or has no feeling.   You have yellow or tan fluid coming from your incision site.   You have bleeding from the incision site.   You have severe pain in your leg that does not go away, even after you have taken pain medicine.   You have trouble breathing.   You have chest pain.   You have a fever.  MAKE SURE YOU:  Understand these instructions.   Will watch your condition.   Will get help right away if you are not doing well or get worse.  Document Released: 03/25/2009 Document Revised: 05/17/2011 Document Reviewed: 03/25/2009 [facility] Patient Information 2012 [place], [place].Acute Ischemic Limb Acute ischemic limb develops when an arm or leg (limb) has trouble getting enough blood. This happens when an artery (a large blood vessel that carries oxygen and nutrients that feed your body) is partially or totally blocked. It is also called critical limb ischemia. The problem affects the legs more often than the arms. It is described as acute when the condition comes on suddenly. When this happens, it is considered a medical emergency. CAUSES  An ischemic limb almost always is the  result of underlying atherosclerotic disease (when artery walls become thick and hard). It develops because of the buildup of fatty material that hardens (plaque) in arteries and veins. This buildup narrows the blood vessels and keeps tissues in the limb from getting enough blood. Small amounts of plaque can break off from the walls of the blood vessels and create a clot. This blocks the blood flow and causes ischemic limb. Other factors that can make ischemic limb more likely are:  Smoking.   High blood pressure.   Diabetes.    High cholesterol levels.   Abnormal heart rhythms (for example, a common one called "atrial fibrillation")   Past operations on the heart and/or blood vessels.   Past problems with blood clots.   Past injury (such as burns or a broken bone) that damaged blood vessels in the limb.  Other conditions can cause ischemic limb, although these are rare. They include:  Buerger's disease, caused by inflamed blood vessels in the hands and feet. This is also called thromboangiitis obliterans.   Some forms of arthritis.   Rare birth defects affecting the arteries of the legs.  SYMPTOMS  The major signs and symptoms of ischemic limb are:   Pain in the leg while resting (also called "rest pain"). This is sometimes described as a burning pain in the foot that gets worse when lying down.   The leg or arm is cool to the touch.   The leg or arm lacks color.   The leg or arm will not move.   There is numbness, a tingling or a prickling sensation in the limb.   Blood flow or pulse cannot be found by your caregiver in the arm or leg.  DIAGNOSIS  In diagnosing ischemic limb a caregiver considers:   The person's description of how the leg or arm has been feeling.   The results of the caregiver's own examination.   Blood tests.  PROGNOSIS  With timely emergency treatment, there is a greater than 50-50 chance that a caregiver can fix or remove the block in the artery that is causing the ischemic limb. This could require medication and, possibly, an operation. If medical care is not gotten quickly enough, the limb might need to be surgically removed (amputated). TREATMENT  Possible treatments for ischemic limb include:  Medications to break down blood clots and let blood flow more easily. These medicines must be given directly into a blood vessel and requires staying the hospital.   Revascularization, which is surgery to improve blood flow by putting in new blood vessels.   Angioplasty,  which is surgery that places a small balloon in an artery to open blood flow.   Surgery to place a stent (a flexible metal tube) in an artery to keep it open.   Amputation, which is surgical removal of some or all of the affected limb.  RELATED COMPLICATIONS People who are treated for acute ischemic limb are more likely to:  Have more-than-normal bleeding caused by blood-thinning medications.   Have ongoing pain.   Develop an infection in the arm or leg.  If surgery is needed, possible complications could include:  A possible need for blood transfusions.   Infection.   Swelling inside the limb that squeezes the blood vessels. This is called "Compartment Syndrome", a condition that requires more surgery.   A long recovery time.   Loss of mobility.   A need for amputation.   The need for more surgery.  Document Released: 03/06/2008 Document  Revised: 05/17/2011 Document Reviewed: 03/06/2008 [facility] Patient Information 2012 [place], [place].Peripheral Vascular Disease  Peripheral vascular disease (PVD) is caused by cholesterol buildup in the arteries. The arteries become narrow or clogged. This makes it hard for blood to flow. It happens most in the legs, but it can occur in other areas of your body. HOME CARE   Quit smoking, if you smoke.   Exercise as told by your doctor.   Follow a low-fat, low-cholesterol diet as told by your doctor.   Control your diabetes, if you have diabetes.   Care for your feet to prevent infection.   Only take medicine as told by your doctor.  GET HELP RIGHT AWAY IF:   You have pain or lose feeling (numbness) in your arms or legs.   Your arms or legs turn cold or blue.   You have redness, warmth, and puffiness (swelling) in your arms or legs.  MAKE SURE YOU:   Understand these instructions.   Will watch your condition.   Will get help right away if you are not doing well or get worse.  Document Released: 08/22/2009 Document Revised:  05/17/2011 Document Reviewed: 08/22/2009 [facility] Patient Information 2012 [place], [place].

## 2013-08-26 ENCOUNTER — Other Ambulatory Visit: Payer: Self-pay | Admitting: Gastroenterology

## 2013-08-26 DIAGNOSIS — R634 Abnormal weight loss: Secondary | ICD-10-CM

## 2013-08-26 DIAGNOSIS — K2289 Other specified disease of esophagus: Secondary | ICD-10-CM

## 2013-08-26 DIAGNOSIS — K228 Other specified diseases of esophagus: Secondary | ICD-10-CM

## 2013-08-26 DIAGNOSIS — C801 Malignant (primary) neoplasm, unspecified: Secondary | ICD-10-CM

## 2013-08-26 HISTORY — DX: Malignant (primary) neoplasm, unspecified: C80.1

## 2013-08-27 ENCOUNTER — Other Ambulatory Visit: Payer: Self-pay | Admitting: *Deleted

## 2013-08-27 ENCOUNTER — Telehealth: Payer: Self-pay | Admitting: *Deleted

## 2013-08-27 DIAGNOSIS — C155 Malignant neoplasm of lower third of esophagus: Secondary | ICD-10-CM

## 2013-08-27 NOTE — Telephone Encounter (Signed)
Spoke with patient by  Phone and confirmed appointments with Dr. Benay Spice and Dr. Lisbeth Renshaw.  Gave contact names, numbers, and directions.  Patient verbalized comprehension.

## 2013-08-28 NOTE — Progress Notes (Signed)
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 No  Pacemaker/ICD? NO   Is the patient on methotrexate? No  Current Complaints / other details:  09/03/13 Pet Scan, Dietician appt with Ernestene Kiel 09/04/13,

## 2013-08-31 ENCOUNTER — Ambulatory Visit
Admission: RE | Admit: 2013-08-31 | Discharge: 2013-08-31 | Disposition: A | Discharge status: Home / Self Care | Payer: 59 | Source: Ambulatory Visit | Attending: Radiation Oncology | Admitting: Radiation Oncology

## 2013-08-31 ENCOUNTER — Encounter: Payer: Self-pay | Admitting: Radiation Oncology

## 2013-08-31 ENCOUNTER — Telehealth: Payer: Self-pay | Admitting: *Deleted

## 2013-08-31 VITALS — BP 134/96 | HR 89 | Temp 98.8°F | Resp 20 | Ht 71.0 in | Wt 115.8 lb

## 2013-08-31 DIAGNOSIS — Z7982 Long term (current) use of aspirin: Secondary | ICD-10-CM | POA: Insufficient documentation

## 2013-08-31 DIAGNOSIS — F17200 Nicotine dependence, unspecified, uncomplicated: Secondary | ICD-10-CM | POA: Insufficient documentation

## 2013-08-31 DIAGNOSIS — K209 Esophagitis, unspecified without bleeding: Secondary | ICD-10-CM | POA: Insufficient documentation

## 2013-08-31 DIAGNOSIS — I2510 Atherosclerotic heart disease of native coronary artery without angina pectoris: Secondary | ICD-10-CM | POA: Insufficient documentation

## 2013-08-31 DIAGNOSIS — Z79899 Other long term (current) drug therapy: Secondary | ICD-10-CM | POA: Insufficient documentation

## 2013-08-31 DIAGNOSIS — C154 Malignant neoplasm of middle third of esophagus: Secondary | ICD-10-CM

## 2013-08-31 DIAGNOSIS — C155 Malignant neoplasm of lower third of esophagus: Secondary | ICD-10-CM | POA: Insufficient documentation

## 2013-08-31 DIAGNOSIS — Z510 Encounter for antineoplastic radiation therapy: Secondary | ICD-10-CM | POA: Insufficient documentation

## 2013-08-31 HISTORY — DX: Malignant (primary) neoplasm, unspecified: C80.1

## 2013-08-31 HISTORY — DX: Alcohol abuse, uncomplicated: F10.10

## 2013-08-31 NOTE — Progress Notes (Signed)
Please see the Nurse Progress Note in the MD Initial Consult Encounter for this patient. 

## 2013-08-31 NOTE — Telephone Encounter (Signed)
Called Dr.Mann's office to see if path report from 08/26/13 is ready, answering machine came on and said the office ws closed between 1230-130pm, will call back after lunch 1:13 PM

## 2013-09-02 DIAGNOSIS — C155 Malignant neoplasm of lower third of esophagus: Secondary | ICD-10-CM | POA: Insufficient documentation

## 2013-09-02 NOTE — Progress Notes (Signed)
Radiation Oncology         [phone]) [phone] ________________________________  Name: Brandon [name] MRN: [id number]  Date: 08/31/2013  DOB: Apr 13, 1956  IH:[id number], Provider, MD  Juanita Craver, MD     REFERRING PHYSICIAN: Juanita Craver, MD   DIAGNOSIS: The encounter diagnosis was Malignant neoplasm of lower third of esophagus.   HISTORY OF PRESENT ILLNESS::Brandon [name] is a 58 y.o. male who is seen for an initial consultation visit. The patient is seen today regarding a new diagnosis of adenocarcinoma of the distal esophagus/GE junction. The patient notes that he has had difficulty swallowing for approximately 6 months. He has lost a substantial amount of weight over the last year, estimated 50 pounds. This has been associated with some dizziness. He has also noticed some decreased bowel movements associated with these changes. He denies any melena or hematochezia.  The patient was referred to GI medicine and proceeded to undergo an upper endoscopy. A malignant appearing friable mass resulting in stenosis was noted at 33 centimeters from the incisors. This extended to the GE junction. The stomach and the proximal small bowel were not visualized. The esophageal stricture extended from 33-40 cm. Biopsy was obtained and this has returned positive for invasive squamous cell carcinoma, moderately differentiated. A PET scan is pending and the patient is being seen by Dr. Benay Spice and medical oncology later this week.  At this time the patient indicates that he continues to have substantial difficulty with solid foods. Today, he indicates that he does not have any difficulty with liquids. He also has been able to be soft foods such as putting. He is drinking Ensure at this time. The patient also has been scheduled to see nutrition as well later this week.    PREVIOUS RADIATION THERAPY: No   PAST MEDICAL HISTORY:  has a past medical history of Coronary artery disease; Cancer (08/26/13); and ETOH abuse.      PAST SURGICAL HISTORY: Past Surgical History  Procedure Laterality Date  . Cardiac surgery      femoral bypass surgery     FAMILY HISTORY: family history includes Stroke in his mother.   SOCIAL HISTORY:  reports that he has been smoking Cigarettes.  He started smoking 2 days ago. He has a 40 pack-year smoking history. He does not have any smokeless tobacco history on file. He reports that he drinks alcohol. He reports that he does not use illicit drugs.   ALLERGIES: Review of patient's allergies indicates no known allergies.   MEDICATIONS:  Current Outpatient Prescriptions  Medication Sig Dispense Refill  . aspirin EC 81 MG tablet Take 81 mg by mouth daily.      Marland Kitchen ibuprofen (ADVIL,MOTRIN) 200 MG tablet Take 200 mg by mouth every 6 (six) hours as needed.      . lactose free nutrition (BOOST) LIQD Take 237 mLs by mouth 2 (two) times daily between meals.      . Cholecalciferol (VITAMIN D PO) Take 1 tablet by mouth 2 (two) times daily.      . Omega-3 Fatty Acids (FISH OIL PO) Take 1 capsule by mouth 2 (two) times daily.       No current facility-administered medications for this encounter.     REVIEW OF SYSTEMS:  A 15 point review of systems is documented in the electronic medical record. This was obtained by the nursing staff. However, I reviewed this with the patient to discuss relevant findings and make appropriate changes.  Pertinent items are noted in HPI.  PHYSICAL EXAM:  height is 5\' 11"  (1.803 m) and weight is 115 lb 12.8 oz (52.527 kg). His oral temperature is 98.8 F (37.1 C). His blood pressure is 134/96 and his pulse is 89. His respiration is 20 and oxygen saturation is 100%.   ECOG = 1  0 - Asymptomatic (Fully active, able to carry on all predisease activities without restriction)  1 - Symptomatic but completely ambulatory (Restricted in physically strenuous activity but ambulatory and able to carry out work of a light or sedentary nature. For example, light  housework, office work)  2 - Symptomatic, <50% in bed during the day (Ambulatory and capable of all self care but unable to carry out any work activities. Up and about more than 50% of waking hours)  3 - Symptomatic, >50% in bed, but not bedbound (Capable of only limited self-care, confined to bed or chair 50% or more of waking hours)  4 - Bedbound (Completely disabled. Cannot carry on any self-care. Totally confined to bed or chair)  5 - Death   Eustace Pen MM, Creech RH, Tormey DC, et al. [phone]). "Toxicity and response criteria of the [facility]". [place] Oncol. 5 (6): 649-55  General: very thin, in no acute distress HEENT: Normocephalic, atraumatic; oral cavity clear Neck: Supple without any lymphadenopathy Cardiovascular: Regular rate and rhythm Respiratory: Clear to auscultation bilaterally GI: Soft, nontender, normal bowel sounds Extremities: No edema present Neuro: No focal deficits     LABORATORY DATA:  Lab Results  Component Value Date   WBC 7.7 11/26/2011   HGB 14.6 11/26/2011   HCT 41.0 11/26/2011   MCV 96.5 11/26/2011   PLT 235 11/26/2011   Lab Results  Component Value Date   NA 140 11/26/2011   K 3.9 11/26/2011   CL 103 11/26/2011   CO2 26 11/26/2011   Lab Results  Component Value Date   ALT 99* 08/04/2007   AST 45* 08/04/2007   ALKPHOS 159* 08/04/2007   BILITOT 0.4 08/04/2007      RADIOGRAPHY: No results found.     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 The patient has not yet been fully staged, with PET scan pending.   I discussed with the patient a potential role for radiation treatment in addition to chemotherapy. We also discussed try modality treatment in terms of potential resection as well. The patient has lost a substantial amount of weight and I believe certainly needs to improve his nutritional status to be a better candidate for surgery. Depending on his PET scan,  my initial recommendation would be to consider chemoradiation followed by subsequent assessment for surgery.  I discussed with the patient therefore a potential 5-1/2-6 week course of radiation treatment. I discussed with them the details and logistics of treatment as well as the possible benefit of such treatment. I also discussed with the patient the potential side effects and risks of treatment as well. All of his questions were answered. The patient is interested in proceeding with this overall treatment plan. He understands that his treatment plan may need to change depending on the results of his PET scan.   PLAN: I will followup on the patient's PET scan and he will be scheduled for a simulation if this remains an appropriate option. He is scheduled to see nutrition and medical oncology later this week. One issue I discussed with him in detail was his nutritional status. He states he has no  difficulty swallowing liquids at this time and therefore we discussed him increasing his intake including Ensure. We also discussed different additional options and he will further discuss this with nutrition later this week. I did bring up the issue of possible feeding tube if he is unable to stop his weight loss. The patient is quite thin today. Given the location of the tumor and the possibility of subsequent resection, this may require a J-tube.        ________________________________   Jodelle Gross, MD, PhD

## 2013-09-03 ENCOUNTER — Encounter (HOSPITAL_COMMUNITY): Payer: Self-pay

## 2013-09-03 ENCOUNTER — Ambulatory Visit (HOSPITAL_COMMUNITY)
Admission: RE | Admit: 2013-09-03 | Discharge: 2013-09-03 | Disposition: A | Discharge status: Home / Self Care | Payer: 59 | Source: Ambulatory Visit | Attending: Gastroenterology | Admitting: Gastroenterology

## 2013-09-03 DIAGNOSIS — K228 Other specified diseases of esophagus: Secondary | ICD-10-CM

## 2013-09-03 DIAGNOSIS — R634 Abnormal weight loss: Secondary | ICD-10-CM | POA: Insufficient documentation

## 2013-09-03 DIAGNOSIS — K229 Disease of esophagus, unspecified: Secondary | ICD-10-CM | POA: Insufficient documentation

## 2013-09-03 DIAGNOSIS — K2289 Other specified disease of esophagus: Secondary | ICD-10-CM

## 2013-09-03 LAB — GLUCOSE, CAPILLARY: GLUCOSE-CAPILLARY: 100 mg/dL — AB (ref 70–99)

## 2013-09-03 MED ORDER — FLUDEOXYGLUCOSE F - 18 (FDG) INJECTION
5.7000 | Freq: Once | INTRAVENOUS | Status: AC | PRN
  Administered 2013-09-03: 5.7 via INTRAVENOUS

## 2013-09-04 ENCOUNTER — Telehealth: Payer: Self-pay | Admitting: Oncology

## 2013-09-04 ENCOUNTER — Ambulatory Visit
Admission: RE | Admit: 2013-09-04 | Discharge: 2013-09-04 | Disposition: A | Discharge status: Home / Self Care | Payer: 59 | Source: Ambulatory Visit | Attending: Radiation Oncology | Admitting: Radiation Oncology

## 2013-09-04 ENCOUNTER — Ambulatory Visit: Payer: 59

## 2013-09-04 ENCOUNTER — Ambulatory Visit: Payer: 59 | Admitting: Nutrition

## 2013-09-04 ENCOUNTER — Ambulatory Visit (HOSPITAL_BASED_OUTPATIENT_CLINIC_OR_DEPARTMENT_OTHER): Payer: 59 | Admitting: Nurse Practitioner

## 2013-09-04 ENCOUNTER — Encounter: Payer: Self-pay | Admitting: Nurse Practitioner

## 2013-09-04 VITALS — BP 127/72 | HR 85 | Temp 98.7°F | Resp 18 | Ht 71.0 in | Wt 118.0 lb

## 2013-09-04 DIAGNOSIS — C155 Malignant neoplasm of lower third of esophagus: Secondary | ICD-10-CM

## 2013-09-04 DIAGNOSIS — R1319 Other dysphagia: Secondary | ICD-10-CM

## 2013-09-04 DIAGNOSIS — R634 Abnormal weight loss: Secondary | ICD-10-CM

## 2013-09-04 MED ORDER — DEXAMETHASONE 4 MG PO TABS: ORAL_TABLET | ORAL | Status: DC

## 2013-09-04 NOTE — Progress Notes (Addendum)
[facility] [facility] Patient Consult   Referring MD:    LOIC HOBIN 58 y.o.  1956-04-04    Reason for Referral: Esophagus cancer.   HPI: Mr. Trefz is a 58 year old man recently diagnosed with esophagus cancer. He presented with a three-month history of progressive dysphagia and weight loss. He underwent an EGD by Dr. Collene Mares on 08/26/2013 with findings of a malignant appearing friable mass causing stenosis at 33 cm from the incisors extending to the GE junction. Stomach and the proximal small bowel were not visualized. 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 Staging PET scan on 09/03/2013 showed a 6.5 cm segment of intense hypermetabolic activity associated with the mid to distal esophagus with SUV max 24.5. There was no evidence of mediastinal metastasis, gastrohepatic ligament nodal metastasis or liver metastasis.  Past medical history: 1. Hypertension.  Past surgical history: 1. Aortobifemoral bypass grafting with aorta superior mesenteric artery bypass 07/28/2010.  Medications: Reviewed  Allergies: No Known Allergies  Family history: Mother deceased, history of a stroke. Father is in good health. Brother and sister both in good health. No family history of cancer.  Social History: He lives in [place]. He is divorced. He has 2 healthy children. He works in Advertising copywriter. He quit smoking one week ago. He smoked one pack per day for 35-40 years. He reports current EtOH intake as a few beers every weekend. Heavier intake in the past.  ROS:   He has a three-month history of progressive dysphagia. He denies odynophagia. He has lost a significant amount of weight. At present he is tolerating soft solids and liquids. He intermittently regurgitates. He has mild intermittent nausea. He has had some constipation or the past 3-4 weeks. He recently noted blood on the toilet tissue after a bowel movement. No  fevers or sweats. He has occasional headaches. No diplopia. He denies shortness of breath. Occasional cough. He has periodic discomfort at the mid chest. He occasionally notes tingling in the fingertips. No hematuria or dysuria.  A complete ROS was otherwise negative.  Physical Exam:  Blood pressure 127/72, pulse 85, temperature 98.7 F (37.1 C), temperature source Oral, resp. rate 18, height 5' 11"  (1.803 m), weight 118 lb (53.524 kg), SpO2 100.00%.  HEENT: Normocephalic atraumatic. Pupils equal round and reactive to light. Extraocular movements intact. Sclera anicteric. Lungs: Lungs diminished at the bases. Cardiac: Regular cardiac rhythm. Abdomen: Soft and nontender. No hepatomegaly.  Vascular: No leg edema. Lymph nodes: No palpable cervical or supraclavicular lymph nodes. Small bilateral axillary lymph nodes. Neurologic: Alert and oriented. Motor strength 5 over 5. Knee DTRs 1+, symmetric.   LAB:  CBC and chemistry panel to be obtained day of chemotherapy class  Imaging:  Nm Pet Image Initial (pi) Skull Base To Thigh  09/03/2013   CLINICAL DATA:  Initial treatment strategy for esophageal mass.  EXAM: NUCLEAR MEDICINE PET SKULL BASE TO THIGH  TECHNIQUE: 5.7 mCi F-18 FDG was injected intravenously. Full-ring PET imaging was performed from the skull base to thigh after the radiotracer. CT data was obtained and used for attenuation correction and anatomic localization.  FASTING BLOOD GLUCOSE:  Value: 100 mg/dl  COMPARISON:  None.  FINDINGS: NECK  No hypermetabolic lymph nodes in the neck.  CHEST  There is a 6.5 cm segment of intense hypermetabolic activity associated with the mid to distal esophagus with SUV max equals 24.5. The activity initiates just above the carina and terminates several cm from the GE  junction. No hypermetabolic mediastinal lymph nodes.  No suspicious or metabolic pulmonary nodules.  There is a precarinal lymph node measuring 13 mm (image 73) without associated  metabolic activity  ABDOMEN/PELVIS  No abnormal hypermetabolic activity within the liver, pancreas, adrenal glands, or spleen. No hypermetabolic lymph nodes in the abdomen or pelvis.  SKELETON  No hypermetabolic gastrohepatic ligament lymph nodes. No abnormal metabolic activity within the liver. No hypermetabolic abdominal pelvic nodes.  As on note of a aortic graft repair.  IMPRESSION: 1. Long segment of intense hypermetabolic activity within the mid to distal esophagus consistent with esophageal carcinoma. 2. No evidence of mediastinal metastasis, gastrohepatic ligament nodal metastasis or liver metastasis. 3. No distant metastasis.   Electronically Signed   By: Suzy Bouchard M.D.   On: 09/03/2013 09:45      Assessment/Plan:   1. 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   EGD by Dr. Collene Mares on 08/26/2013 with findings of a malignant appearing friable mass causing stenosis at 33 cm from the incisors extending to the GE junction. Stomach and the proximal small bowel were not visualized.   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   Staging PET scan on 09/03/2013 showed a 6.5 cm segment of intense hypermetabolic activity associated with the mid to distal esophagus with SUV max 24.5. There was no evidence of mediastinal metastasis, gastrohepatic ligament nodal metastasis or liver metastasis. 2. Dysphagia secondary to #1. 3. Weight loss secondary to #1.   Disposition:   Mr. Knight has been diagnosed with esophagus cancer. Staging PET scan shows no evidence of distant disease. He has seen Dr. Lisbeth Renshaw and is scheduled to begin radiation on 09/14/2013. Dr. Benay Spice recommends concurrent weekly Taxol/carboplatin chemotherapy. We made a referral to Dr. Servando Snare for surgical evaluation.  We reviewed potential toxicities associated with the chemotherapy including myelosuppression, nausea, mouth sores, hair loss, constipation or  diarrhea, allergic reaction. We discussed the possibility of peripheral neuropathy and arthralgias with Taxol. He will attend a chemotherapy education class. Dexamethasone premedication prior to week 1 chemotherapy was called to his pharmacy.  Dr. Benay Spice discussed the EGD findings with Dr. Collene Mares. An endoscopic ultrasound cannot be performed.  At present he is tolerating soft solids and liquids. He met with the [place] dietitian earlier today. We will follow his nutritional status closely throughout the course of treatment.  Mr. Langenderfer will return to begin week 1 Taxol/carboplatin on 09/14/2013. We will see him in followup on 09/21/2013.   Patient seen with Dr. Benay Spice. 50 minutes were spent face-to-face at today's visit with the majority of that time involved in counseling/coordination of care.   Ned Card, ANP/GNP-BC 09/04/2013, 3:51 PM  This was a shared visit with Ned Card. Mr. Veselka was interviewed and examined. The plan is to begin concurrent chemotherapy and radiation on 09/14/2013. We will refer him to Dr. Servando Snare.  Julieanne Manson, M.D.

## 2013-09-04 NOTE — Telephone Encounter (Signed)
gv and printed appt sched and avs for pt for April and May....sed added tx.Marland KitchenMarland KitchenMarland KitchenMarland Kitchenemailed MW to add tx.

## 2013-09-04 NOTE — Progress Notes (Signed)
Checked in new patient with no financial issues. He [place] been [place] of [place].

## 2013-09-04 NOTE — Telephone Encounter (Signed)
s.w. pt Brandon [name] at [facility] adn she will call back with d.t for appt.Marland KitchenMarland KitchenMarland Kitchen

## 2013-09-04 NOTE — Progress Notes (Signed)
58 year old male diagnosed with cancer of the distal esophagus/GE junction.  He is a patient of Dr. Benay Spice and Dr. Lisbeth Renshaw.  Past medical history includes CAD, alcohol, and tobacco.  Medications include vitamin D and omega-3 fatty acids.  Labs were reviewed.  Height: 5 feet 11 inches. Weight: 115.8 pounds. Usual body weight: 140 pounds. BMI: 16.16.  Estimated nutrition needs: 1800-2000 calories, 75-90 g protein, 2.0 L fluid daily.  Patient reports he has been unable to swallow solid foods for approximately 2 weeks on a consistent basis.  He has been experiencing difficulty swallowing for some time now.  Patient estimates he has lost approximately 25 pounds from his usual body weight.  He can tolerate fried eggs, chicken noodle soup, and boost high-protein.  Patient reports he has been drinking 3 boost high-protein daily.  He does not have a blender.  He does have difficulty affording oral nutrition supplements.  Nutrition diagnosis: Inadequate oral intake related to new diagnosis of esophageal cancer and dysphasia as evidenced by 18% weight loss from usual body weight and BMI of 16.16.  Intervention: Patient educated to change oral nutrition supplement to boost plus or Ensure Plus and increase consumption to 4-5 daily.  Patient was educated on strategies for blenderizing foods and adding extra sauces and gravies for easier swallowing.  Encouraged patient to consume small meals throughout the day and drink oral nutrition supplement between meals.  I've encouraged him to add ice cream or other flavored syrups to his supplements for improving taste.  Provided samples and one complementary case of Ensure Plus for patient.  Fact sheets were given.  Teach back method used.  Monitoring, evaluation, goals: Patient will tolerate oral nutrition supplements 4-5 daily along with very soft, moist foods, and other liquids to promote weight gain.  Next visit: I will followup with patient during treatment  once plan is determined.  He has my contact information for questions before this.

## 2013-09-04 NOTE — Telephone Encounter (Signed)
cld pt per Jenny Reichmann per pof to adv pt that appt had been made with Dr Eliseo Squires 4/9 @11 :00.-Gave pt the address and tele # to Dr office

## 2013-09-05 ENCOUNTER — Encounter: Payer: Self-pay | Admitting: Nurse Practitioner

## 2013-09-07 ENCOUNTER — Telehealth: Payer: Self-pay | Admitting: Oncology

## 2013-09-07 ENCOUNTER — Telehealth: Payer: Self-pay | Admitting: *Deleted

## 2013-09-07 NOTE — Telephone Encounter (Signed)
Per staff message and POF I have scheduled appts.  Advised scheduler to move lab appt

## 2013-09-07 NOTE — Telephone Encounter (Signed)
s.w. pt father.Brandon [name] was at work....advised to pick up updated sched at chemo edu class due to time change for 4.6.15...ok adn aware

## 2013-09-09 ENCOUNTER — Other Ambulatory Visit (HOSPITAL_BASED_OUTPATIENT_CLINIC_OR_DEPARTMENT_OTHER): Payer: 59

## 2013-09-09 ENCOUNTER — Encounter: Payer: Self-pay | Admitting: *Deleted

## 2013-09-09 ENCOUNTER — Other Ambulatory Visit: Payer: 59

## 2013-09-09 DIAGNOSIS — C155 Malignant neoplasm of lower third of esophagus: Secondary | ICD-10-CM

## 2013-09-09 LAB — COMPREHENSIVE METABOLIC PANEL (CC13)
ALK PHOS: 61 U/L (ref 40–150)
ALT: 9 U/L (ref 0–55)
AST: 15 U/L (ref 5–34)
Albumin: 3.2 g/dL — ABNORMAL LOW (ref 3.5–5.0)
Anion Gap: 8 mEq/L (ref 3–11)
BUN: 11.3 mg/dL (ref 7.0–26.0)
CHLORIDE: 105 meq/L (ref 98–109)
CO2: 30 mEq/L — ABNORMAL HIGH (ref 22–29)
CREATININE: 0.8 mg/dL (ref 0.7–1.3)
Calcium: 9.9 mg/dL (ref 8.4–10.4)
Glucose: 111 mg/dl (ref 70–140)
Potassium: 4.4 mEq/L (ref 3.5–5.1)
Sodium: 142 mEq/L (ref 136–145)
Total Bilirubin: 0.25 mg/dL (ref 0.20–1.20)
Total Protein: 6.5 g/dL (ref 6.4–8.3)

## 2013-09-09 LAB — CBC WITH DIFFERENTIAL/PLATELET
BASO%: 1.3 % (ref 0.0–2.0)
Basophils Absolute: 0.1 10*3/uL (ref 0.0–0.1)
EOS%: 1.6 % (ref 0.0–7.0)
Eosinophils Absolute: 0.1 10*3/uL (ref 0.0–0.5)
HCT: 43.6 % (ref 38.4–49.9)
HGB: 14.5 g/dL (ref 13.0–17.1)
LYMPH#: 2.1 10*3/uL (ref 0.9–3.3)
LYMPH%: 24.8 % (ref 14.0–49.0)
MCH: 32.7 pg (ref 27.2–33.4)
MCHC: 33.3 g/dL (ref 32.0–36.0)
MCV: 98.3 fL — ABNORMAL HIGH (ref 79.3–98.0)
MONO#: 0.7 10*3/uL (ref 0.1–0.9)
MONO%: 7.6 % (ref 0.0–14.0)
NEUT#: 5.5 10*3/uL (ref 1.5–6.5)
NEUT%: 64.7 % (ref 39.0–75.0)
Platelets: 272 10*3/uL (ref 140–400)
RBC: 4.44 10*6/uL (ref 4.20–5.82)
RDW: 13.6 % (ref 11.0–14.6)
WBC: 8.6 10*3/uL (ref 4.0–10.3)

## 2013-09-13 ENCOUNTER — Other Ambulatory Visit: Payer: Self-pay | Admitting: Oncology

## 2013-09-14 ENCOUNTER — Telehealth: Payer: Self-pay | Admitting: Internal Medicine

## 2013-09-14 ENCOUNTER — Other Ambulatory Visit (HOSPITAL_BASED_OUTPATIENT_CLINIC_OR_DEPARTMENT_OTHER): Payer: 59

## 2013-09-14 ENCOUNTER — Other Ambulatory Visit: Payer: Self-pay | Admitting: *Deleted

## 2013-09-14 ENCOUNTER — Ambulatory Visit
Admission: RE | Admit: 2013-09-14 | Discharge: 2013-09-14 | Disposition: A | Discharge status: Home / Self Care | Payer: 59 | Source: Ambulatory Visit | Attending: Radiation Oncology | Admitting: Radiation Oncology

## 2013-09-14 ENCOUNTER — Other Ambulatory Visit: Payer: 59

## 2013-09-14 ENCOUNTER — Encounter: Payer: Self-pay | Admitting: *Deleted

## 2013-09-14 ENCOUNTER — Ambulatory Visit: Payer: 59

## 2013-09-14 DIAGNOSIS — C155 Malignant neoplasm of lower third of esophagus: Secondary | ICD-10-CM

## 2013-09-14 LAB — COMPREHENSIVE METABOLIC PANEL (CC13)
ALK PHOS: 64 U/L (ref 40–150)
ALT: 10 U/L (ref 0–55)
AST: 17 U/L (ref 5–34)
Albumin: 3.4 g/dL — ABNORMAL LOW (ref 3.5–5.0)
Anion Gap: 10 mEq/L (ref 3–11)
BILIRUBIN TOTAL: 0.29 mg/dL (ref 0.20–1.20)
BUN: 11.3 mg/dL (ref 7.0–26.0)
CO2: 28 mEq/L (ref 22–29)
CREATININE: 0.9 mg/dL (ref 0.7–1.3)
Calcium: 10 mg/dL (ref 8.4–10.4)
Chloride: 105 mEq/L (ref 98–109)
GLUCOSE: 125 mg/dL (ref 70–140)
Potassium: 4.2 mEq/L (ref 3.5–5.1)
Sodium: 142 mEq/L (ref 136–145)
Total Protein: 6.7 g/dL (ref 6.4–8.3)

## 2013-09-14 LAB — CBC WITH DIFFERENTIAL/PLATELET
BASO%: 0.9 % (ref 0.0–2.0)
BASOS ABS: 0.1 10*3/uL (ref 0.0–0.1)
EOS ABS: 0.1 10*3/uL (ref 0.0–0.5)
EOS%: 0.8 % (ref 0.0–7.0)
HCT: 42.1 % (ref 38.4–49.9)
HEMOGLOBIN: 13.7 g/dL (ref 13.0–17.1)
LYMPH%: 23.6 % (ref 14.0–49.0)
MCH: 32 pg (ref 27.2–33.4)
MCHC: 32.7 g/dL (ref 32.0–36.0)
MCV: 98.1 fL — AB (ref 79.3–98.0)
MONO#: 0.8 10*3/uL (ref 0.1–0.9)
MONO%: 8.1 % (ref 0.0–14.0)
NEUT#: 6.3 10*3/uL (ref 1.5–6.5)
NEUT%: 66.6 % (ref 39.0–75.0)
PLATELETS: 268 10*3/uL (ref 140–400)
RBC: 4.29 10*6/uL (ref 4.20–5.82)
RDW: 13.8 % (ref 11.0–14.6)
WBC: 9.4 10*3/uL (ref 4.0–10.3)
lymph#: 2.2 10*3/uL (ref 0.9–3.3)

## 2013-09-14 MED ORDER — BIAFINE EX EMUL
CUTANEOUS | Status: DC | PRN
  Administered 2013-09-14: 15:00:00 via TOPICAL

## 2013-09-14 NOTE — Progress Notes (Signed)
[place] Work  Clinical Social Work was referred by patient navigator for assessment of psychosocial needs.  Clinical Social Worker met with patient in radiation oncology to offer support and assess for needs.  Pt will begin radiation and chemo this week, and expressed feeling overwhelmed with the number of appointments and his work schedule.  CSW and pt reviewed pt's schedule and identified that the number of appointments would decrease after this week.  Pt stated his employer would "work" with him, but his main concern was "working enough hours" to support himself financially.  CSW and pt discussed support resources and benefits through his employer.  Pt plans to "look into" his benefits when he returns to work.  Pt drives himself and did not express any concern for transportation.  CSW encouraged pt to call with any additional questions or concerns.  CSW plans to follow up with pt during his chemo treatment tomorrow.    Johnnye Lana, MSW, [place] Worker [facility] [phone]

## 2013-09-14 NOTE — Progress Notes (Signed)
Called home # to follow up on [place] for 1st Taxotere today-was told by his father he is at work. Plans to come in this afternoon-was not aware he had chemo today. Will reschedule for tomorrow-POF sent. Notified radiation oncology to have him reschedule today while here. Will try to do his labs today.

## 2013-09-14 NOTE — [facility] Oncology Navigator Note (Unsigned)
Patient was FTKA for chemo today.  He arrived at Radiation as scheduled.  He was overwhelmed with his schedule and said he was unaware of chemo appointment earlier today.  Per MD, will re-schedule for 09/15/13.  This RN met with patient along with SW and reviewed patient's current schedule.  Discussed importance and reasoning for treatment, appointments, labs. etc.  Educational information on esophageal cancer was provided.  Patient has appointment already scheduled to see dietician and to see Dr. Servando Snare.  Patient understands to pick up pre-med at pharmacy and to begin taking tonight as directed.  SW gave information to patient on costs of drug.  Patient very worried about his job and his need to work.  SW counseled patient re: STD,. and meeting with employer re: benefits.  Patient stated he felt much better after meeting with him and reviewing schedule and answering questions.  Will continue to follow.

## 2013-09-15 ENCOUNTER — Ambulatory Visit (HOSPITAL_BASED_OUTPATIENT_CLINIC_OR_DEPARTMENT_OTHER): Payer: 59

## 2013-09-15 ENCOUNTER — Ambulatory Visit
Admission: RE | Admit: 2013-09-15 | Discharge: 2013-09-15 | Disposition: A | Discharge status: Home / Self Care | Payer: 59 | Source: Ambulatory Visit | Attending: Radiation Oncology | Admitting: Radiation Oncology

## 2013-09-15 ENCOUNTER — Encounter: Payer: Self-pay | Admitting: *Deleted

## 2013-09-15 VITALS — BP 153/89 | HR 72 | Temp 98.2°F | Resp 17

## 2013-09-15 DIAGNOSIS — Z5111 Encounter for antineoplastic chemotherapy: Secondary | ICD-10-CM

## 2013-09-15 DIAGNOSIS — C155 Malignant neoplasm of lower third of esophagus: Secondary | ICD-10-CM

## 2013-09-15 MED ORDER — FAMOTIDINE IN NACL 20-0.9 MG/50ML-% IV SOLN
INTRAVENOUS | Status: AC
  Filled 2013-09-15: qty 50

## 2013-09-15 MED ORDER — DIPHENHYDRAMINE HCL 50 MG/ML IJ SOLN
25.0000 mg | Freq: Once | INTRAMUSCULAR | Status: AC
  Administered 2013-09-15: 25 mg via INTRAVENOUS

## 2013-09-15 MED ORDER — SODIUM CHLORIDE 0.9 % IV SOLN
50.0000 mg/m2 | Freq: Once | INTRAVENOUS | Status: AC
  Administered 2013-09-15: 84 mg via INTRAVENOUS
  Filled 2013-09-15: qty 14

## 2013-09-15 MED ORDER — SODIUM CHLORIDE 0.9 % IV SOLN
204.2000 mg | Freq: Once | INTRAVENOUS | Status: AC
  Administered 2013-09-15: 200 mg via INTRAVENOUS
  Filled 2013-09-15: qty 20

## 2013-09-15 MED ORDER — SODIUM CHLORIDE 0.9 % IV SOLN
Freq: Once | INTRAVENOUS | Status: AC
  Administered 2013-09-15: 13:00:00 via INTRAVENOUS

## 2013-09-15 MED ORDER — ONDANSETRON 16 MG/50ML IVPB (CHCC)
16.0000 mg | Freq: Once | INTRAVENOUS | Status: AC
Start: 2013-09-15 — End: 2013-09-15
  Administered 2013-09-15: 16 mg via INTRAVENOUS

## 2013-09-15 MED ORDER — FAMOTIDINE IN NACL 20-0.9 MG/50ML-% IV SOLN
20.0000 mg | Freq: Once | INTRAVENOUS | Status: AC
  Administered 2013-09-15: 20 mg via INTRAVENOUS

## 2013-09-15 MED ORDER — ONDANSETRON 16 MG/50ML IVPB (CHCC)
INTRAVENOUS | Status: AC
  Filled 2013-09-15: qty 16

## 2013-09-15 MED ORDER — DEXAMETHASONE SODIUM PHOSPHATE 20 MG/5ML IJ SOLN
20.0000 mg | Freq: Once | INTRAMUSCULAR | Status: AC
  Administered 2013-09-15: 20 mg via INTRAVENOUS

## 2013-09-15 MED ORDER — PROCHLORPERAZINE MALEATE 10 MG PO TABS: 10.0000 mg | ORAL_TABLET | Freq: Four times a day (QID) | ORAL | Status: DC | PRN

## 2013-09-15 MED ORDER — DEXAMETHASONE SODIUM PHOSPHATE 20 MG/5ML IJ SOLN
INTRAMUSCULAR | Status: AC
  Filled 2013-09-15: qty 5

## 2013-09-15 MED ORDER — DIPHENHYDRAMINE HCL 50 MG/ML IJ SOLN
INTRAMUSCULAR | Status: AC
  Filled 2013-09-15: qty 1

## 2013-09-15 NOTE — [facility] Oncology Navigator Note (Unsigned)
Met with patient during infusion treatment.  He stated he feels better today and less overwhelmed compared to yesterday.  This RN gave patient a  more simple calendar format to follow and he stated his work wanted a copy.  He stated his employer is happy to work with him on his appointments.  He verbalized understanding of directions to Dr. Everrett Coombe office.  He knows to go to his pharmacy to pick up his anti-nausea medication.  He expressed no further needs at present time.  Will continue to follow for barriers to care.

## 2013-09-16 ENCOUNTER — Ambulatory Visit
Admission: RE | Admit: 2013-09-16 | Discharge: 2013-09-16 | Disposition: A | Discharge status: Home / Self Care | Payer: 59 | Source: Ambulatory Visit | Attending: Radiation Oncology | Admitting: Radiation Oncology

## 2013-09-17 ENCOUNTER — Other Ambulatory Visit: Payer: Self-pay | Admitting: *Deleted

## 2013-09-17 ENCOUNTER — Encounter: Payer: Self-pay | Admitting: Cardiothoracic Surgery

## 2013-09-17 ENCOUNTER — Institutional Professional Consult (permissible substitution) (INDEPENDENT_AMBULATORY_CARE_PROVIDER_SITE_OTHER): Payer: 59 | Admitting: Cardiothoracic Surgery

## 2013-09-17 ENCOUNTER — Ambulatory Visit
Admission: RE | Admit: 2013-09-17 | Discharge: 2013-09-17 | Disposition: A | Discharge status: Home / Self Care | Payer: 59 | Source: Ambulatory Visit | Attending: Radiation Oncology | Admitting: Radiation Oncology

## 2013-09-17 VITALS — BP 103/74 | HR 82 | Resp 20 | Ht 71.0 in | Wt 114.0 lb

## 2013-09-17 DIAGNOSIS — Z720 Tobacco use: Secondary | ICD-10-CM | POA: Insufficient documentation

## 2013-09-17 DIAGNOSIS — C155 Malignant neoplasm of lower third of esophagus: Secondary | ICD-10-CM

## 2013-09-17 DIAGNOSIS — F1010 Alcohol abuse, uncomplicated: Secondary | ICD-10-CM | POA: Insufficient documentation

## 2013-09-17 DIAGNOSIS — Z9889 Other specified postprocedural states: Secondary | ICD-10-CM

## 2013-09-17 DIAGNOSIS — K551 Chronic vascular disorders of intestine: Secondary | ICD-10-CM | POA: Insufficient documentation

## 2013-09-17 DIAGNOSIS — Z95828 Presence of other vascular implants and grafts: Secondary | ICD-10-CM | POA: Insufficient documentation

## 2013-09-17 DIAGNOSIS — F17200 Nicotine dependence, unspecified, uncomplicated: Secondary | ICD-10-CM

## 2013-09-17 NOTE — Progress Notes (Signed)
Brandon [name]       Brandon [name] [id number]             [phone]                    Brandon [name] [place] Medical Record #[id number] Date of Birth: 11-10-1955  Referring: No ref. provider found Primary Care: No PCP Per Patient  Chief Complaint:    Chief Complaint  Patient presents with  . Esophageal Cancer    Surgical eval, PET Scan 09/03/13    History of Present Illness:    Brandon [name] 58 y.o. 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 The patient notes that he has had difficulty swallowing for approximately 6 months. He has lost a weight 140 to 114 over 3 months. He denies any melena or hematochezia. The patient was referred to GI medicine and proceeded to undergo an upper endoscopy. A malignant appearing friable mass resulting in stenosis was noted at 33 centimeters from the incisors. This extended to the GE junction. The stomach and the proximal small bowel were not visualized. The esophageal stricture extended from 33-40 cm. Biopsy was obtained and this has returned positive for invasive squamous cell carcinoma, moderately differentiated. Esophageal ultrasound could not be done. A PET scan has been done and the patient has been seen by Dr. Benay [name] and radiationl oncology later this week.   The patient has a long-term history of smoking and continues to smoke. He has reduced his alcohol intake to a 12 pack per week over the past several months. Prior to this he notes he was drinking a 12 pack of beer a day. He's able to take liquids and some but no solid food.   The patient's history is complicated by known peripheral vascular disease and mesenteric ischemia. In 2009 he underwent aortobifemoral bypass and bypass to the superior mesenteric artery. 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 The patient  denies any mesenteric ischemic symptoms currently   Current Activity/ Functional Status:  Patient is independent with mobility/ambulation, transfers, ADL's, IADL's.   Zubrod Score: At the time of surgery this patient's most appropriate activity status/level should be described as: [x]     0    Normal activity, no symptoms []     1    Restricted in physical strenuous activity but ambulatory, able to do out light work []     2    Ambulatory and capable of self care, unable to do work activities, up and about               >50 % of waking hours                              []     3    Only limited self care, in bed greater than 50% of waking hours []     4    Completely disabled, no self care, confined to bed or chair []     5    Moribund   Past Medical History  Diagnosis Date      . Cancer 08/26/13    esophageal   . ETOH abuse     Past Surgical History  Procedure Laterality Date  .  DATE OF PROCEDURE: 07/29/2007  OPERATIVE REPORT  PROCEDURE:  1. Aortobifemoral bypass.  2. Aorta to the superior mesenteric artery bypass.  PREOPERATIVE DIAGNOSES:  1. Claudication with early rest pain, right foot.  2. Chronic mesenteric ischemia.  POSTOPERATIVE DIAGNOSES:  1. Claudication with early rest pain, right foot.  2. Chronic mesenteric ischemia. BYPASS GRAFT EVALUATION  INDICATION: Followup, aortobifem and aorta-to-SMA bypass graft.  HISTORY:  Diabetes: No.  Cardiac: No.  Hypertension: Yes.  Smoking: Yes.  Previous Surgery: Aortobifem and aorta-to-SMA bypass grafts on  07/29/07.  SINGLE LEVEL ARTERIAL EXAM  RIGHT LEFT  Brachial: 168 172  Anterior tibial: 164 148  Posterior tibial: 148 150  Peroneal:  Ankle/brachial index: 0.95 0.87  PREVIOUS ABI: Date: 08/20/07 RIGHT: 0.92 LEFT: 0.87  LOWER EXTREMITY BYPASS GRAFT DUPLEX EXAM:  DUPLEX:  1. Biphasic to monophasic waveforms noted throughout the aortobifem  bypass grafts and their native vessels with no evidence of    increased velocities noted.  2. Patent aorta-to-SMA bypass graft with no increase in velocities  noted.  3. Elevated velocity of 231 cm/s noted in the proximal celiac artery.  IMPRESSION:  1. Patent aortobifemoral and aorta-to-superior mesenteric artery  bypass grafts with no evidence of stenosis noted.  2. 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  3. Stable bilateral ankle brachial indices noted.  4. No significant change noted since the previous examinations on  08/13/07 and 08/20/07.  ___________________________________________  Brandon Oto. Fields, MD  CH/MEDQ D: 01/28/2008 T: 01/28/2008 Job: [id number]    Family History  Problem Relation Age of Onset  . Stroke Mother     History   Social History  . Marital Status: Divorced    Spouse Name: N/A    Number of Children: N/A  . Years of Education: N/A   Occupational History  . Not on file.   Social History Main Topics  . Smoking status: Current Every Day Smoker -- 1.00 packs/day for 40 years    Types: Cigarettes    Start date: 08/31/2013  . Smokeless tobacco: Not on file     Comment: down to 1 cigarette daily  . Alcohol Use: Yes     Comment: 12 beer weekly  since last 3 months  . Drug Use: No  . Sexual Activity: Not on file   Other Topics Concern  . Not on file   Social History Narrative  . No narrative on file    History  Smoking status  . Current Every Day Smoker -- 1.00 packs/day for 40 years  . Types: Cigarettes  . Start date: 08/31/2013  Smokeless tobacco  . Not on file    Comment: down to 1 cigarette daily    History  Alcohol Use  . Yes    Comment: 12 beer weekly  since last 3 months     No Known Allergies  Current Outpatient Prescriptions  Medication Sig Dispense Refill  . aspirin EC 81 MG tablet Take 81 mg by mouth daily.      Marland Kitchen dexamethasone (DECADRON) 4 MG tablet Take 2.5 tabs (10 mg) at 10 pm the night before first chemo and 6 am the day of  first chemo.  5 tablet  0  . emollient (BIAFINE) cream Apply 1 application topically daily. Apply to skin area after rad tx daily and prn      . ibuprofen (ADVIL,MOTRIN) 200 MG tablet Take 200 mg by mouth every 6 (six) hours as needed.      Marland Kitchen  lactose free nutrition (BOOST) LIQD Take 237 mLs by mouth 2 (two) times daily between meals.      . prochlorperazine (COMPAZINE) 10 MG tablet Take 1 tablet (10 mg total) by mouth every 6 (six) hours as needed.  60 tablet  1   No current facility-administered medications for this visit.     Review of Systems:     Cardiac Review of Systems: Y or N  Chest Pain [   n ]  Resting SOB [n] Exertional SOB  Blue.Reese  ]  Orthopnea Florencio.Farrier  ]   Pedal Edema Florencio.Farrier   ]    Palpitations Florencio.Farrier  ] Syncope  Florencio.Farrier  ]   Presyncope Florencio.Farrier   ]  General Review of Systems: [Y] = yes [  ]=no Constitional: recent weight change Minnie.Brome  ];  Wt loss over the last 3 months Aura.Spears   ] anorexia [  ]; fatigue [ y ]; nausea [  ]; night sweats [  ]; fever [  ]; or chills [  ];          Dental: poor dentition[ y]; Last Dentist visit:   Eye : blurred vision [  ]; diplopia [   ]; vision changes [  ];  Amaurosis fugax[  ]; Resp: cough [  ];  wheezing[  ];  hemoptysis[  ]; shortness of breath[  ]; paroxysmal nocturnal dyspnea[  ]; dyspnea on exertion[  ]; or orthopnea[  ];  GI:  gallstones[  ], vomiting[n  ];  dysphagia[ y ]; melena[  ];  hematochezia [  ]; heartburn[  ];   Hx of  Colonoscopy[ n ]; GU: kidney stones [  ]; hematuria[  ];   dysuria [  ];  nocturia[  ];  history of     obstruction [  ]; urinary frequency [  ]             Skin: rash, swelling[  ];, hair loss[  ];  peripheral edema[  ];  or itching[  ]; Musculosketetal: myalgias[  ];  joint swelling[  ];  joint erythema[  ];  joint pain[  ];  back pain[  ];  Heme/Lymph: bruising[  ];  bleeding[  ];  anemia[  ];  Neuro: TIA[  ];  headaches[  ];  stroke[  ];  vertigo[  ];  seizures[  ];   paresthesias[  ];  difficulty walking[ n ];  Psych:depression[  ]; anxiety[   ];  Endocrine: diabetes[ n ];  thyroid dysfunction[  ];  Immunizations: Flu up to date [ n ]; Pneumococcal up to date Florencio.Farrier  ];  Other:  Physical Exam: BP 103/74  Pulse 82  Resp 20  Ht 5\' 11"  (1.803 m)  Wt 114 lb (51.71 kg)  BMI 15.91 kg/m2  SpO2 94%  PHYSICAL EXAMINATION:  General appearance: alert, cooperative, appears older than stated age, cachectic and no distress Neurologic: intact Heart: regular rate and rhythm, S1, S2 normal, no murmur, click, rub or gallop Lungs: clear to auscultation bilaterally and normal percussion bilaterally Abdomen: soft, non-tender; bowel sounds normal; no masses,  no organomegaly Extremities: extremities normal, atraumatic, no cyanosis or edema and Homans sign is negative, no sign of DVT Do not appreciate cervical or supraclavicular adenopathy no axillary adenopathy, no carotid bruits No abdominal bruit  Diagnostic Studies & Laboratory data:     Recent Radiology Findings:   Nm Pet Image Initial (pi) Skull Base To Thigh  09/03/2013   CLINICAL DATA:  Initial treatment strategy for esophageal mass.  EXAM: NUCLEAR MEDICINE PET SKULL BASE TO THIGH  TECHNIQUE: 5.7 mCi F-18 FDG was injected intravenously. Full-ring PET imaging was performed from the skull base to thigh after the radiotracer. CT data was obtained and used for attenuation correction and anatomic localization.  FASTING BLOOD GLUCOSE:  Value: 100 mg/dl  COMPARISON:  None.  FINDINGS: NECK  No hypermetabolic lymph nodes in the neck.  CHEST  There is a 6.5 cm segment of intense hypermetabolic activity associated with the mid to distal esophagus with SUV max equals 24.5. The activity initiates just above the carina and terminates several cm from the GE junction. No hypermetabolic mediastinal lymph nodes.  No suspicious or metabolic pulmonary nodules.  There is a precarinal lymph node measuring 13 mm (image 73) without associated metabolic activity  ABDOMEN/PELVIS  No abnormal hypermetabolic activity  within the liver, pancreas, adrenal glands, or spleen. No hypermetabolic lymph nodes in the abdomen or pelvis.  SKELETON  No hypermetabolic gastrohepatic ligament lymph nodes. No abnormal metabolic activity within the liver. No hypermetabolic abdominal pelvic nodes.  As on note of a aortic graft repair.  IMPRESSION: 1. Long segment of intense hypermetabolic activity within the mid to distal esophagus consistent with esophageal carcinoma. 2. No evidence of mediastinal metastasis, gastrohepatic ligament nodal metastasis or liver metastasis. 3. No distant metastasis.   Electronically Signed   By: Suzy Bouchard M.D.   On: 09/03/2013 09:45    Recent Lab Findings: Lab Results  Component Value Date   WBC 9.4 09/14/2013   HGB 13.7 09/14/2013   HCT 42.1 09/14/2013   PLT 268 09/14/2013   GLUCOSE 125 09/14/2013   CHOL  Value: 105        ATP III CLASSIFICATION:  <200     mg/dL   Desirable  200-239  mg/dL   Borderline High  >=240    mg/dL   High 07/28/2007   TRIG 59 08/04/2007   HDL 32* 07/27/2007   LDLCALC  Value: 91        Total Cholesterol/HDL:CHD Risk Coronary Heart Disease Risk Table                     Men   Women  1/2 Average Risk   3.4   3.3 07/19/2007   ALT 10 09/14/2013   AST 17 09/14/2013   NA 142 09/14/2013   K 4.2 09/14/2013   CL 103 11/26/2011   CREATININE 0.9 09/14/2013   BUN 11.3 09/14/2013   CO2 28 09/14/2013   INR 1.1 07/29/2007   Surgical past from [facility] on [address]. 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  Prior to any surgical intervention the patient will need formal  evaluation of mesenteric vessels to avoid potential ischemic complications postoperatively. We'll plan to see the patient back in one month as he nears the end of his chemotherapy and radiation.   I spent 55 minutes counseling the patient face to face. The total time spent in the appointment was 80 minutes.  Grace Isaac MD      [name] ,[place] [address]  Office [phone]   Beeper [id number]  09/17/2013 11:57 AM

## 2013-09-18 ENCOUNTER — Ambulatory Visit: Payer: 59 | Admitting: Radiation Oncology

## 2013-09-18 ENCOUNTER — Ambulatory Visit: Payer: 59

## 2013-09-20 ENCOUNTER — Other Ambulatory Visit: Payer: Self-pay | Admitting: Oncology

## 2013-09-21 ENCOUNTER — Telehealth: Payer: Self-pay | Admitting: Oncology

## 2013-09-21 ENCOUNTER — Ambulatory Visit
Admission: RE | Admit: 2013-09-21 | Discharge: 2013-09-21 | Disposition: A | Discharge status: Home / Self Care | Payer: 59 | Source: Ambulatory Visit | Attending: Radiation Oncology | Admitting: Radiation Oncology

## 2013-09-21 ENCOUNTER — Encounter: Payer: Self-pay | Admitting: Radiation Oncology

## 2013-09-21 ENCOUNTER — Ambulatory Visit (HOSPITAL_BASED_OUTPATIENT_CLINIC_OR_DEPARTMENT_OTHER): Payer: 59

## 2013-09-21 ENCOUNTER — Ambulatory Visit: Payer: 59 | Admitting: Nutrition

## 2013-09-21 ENCOUNTER — Other Ambulatory Visit (HOSPITAL_BASED_OUTPATIENT_CLINIC_OR_DEPARTMENT_OTHER): Payer: 59

## 2013-09-21 ENCOUNTER — Ambulatory Visit (HOSPITAL_BASED_OUTPATIENT_CLINIC_OR_DEPARTMENT_OTHER): Payer: 59 | Admitting: Nurse Practitioner

## 2013-09-21 VITALS — BP 130/86 | HR 84 | Temp 98.2°F | Resp 20 | Wt 119.0 lb

## 2013-09-21 VITALS — BP 117/68 | HR 93 | Temp 97.3°F | Resp 18 | Ht 71.0 in | Wt 117.9 lb

## 2013-09-21 DIAGNOSIS — R1310 Dysphagia, unspecified: Secondary | ICD-10-CM

## 2013-09-21 DIAGNOSIS — C155 Malignant neoplasm of lower third of esophagus: Secondary | ICD-10-CM

## 2013-09-21 DIAGNOSIS — R634 Abnormal weight loss: Secondary | ICD-10-CM

## 2013-09-21 DIAGNOSIS — I739 Peripheral vascular disease, unspecified: Secondary | ICD-10-CM

## 2013-09-21 DIAGNOSIS — Z5111 Encounter for antineoplastic chemotherapy: Secondary | ICD-10-CM

## 2013-09-21 LAB — CBC WITH DIFFERENTIAL/PLATELET
BASO%: 0.9 % (ref 0.0–2.0)
BASOS ABS: 0.1 10*3/uL (ref 0.0–0.1)
EOS%: 1.4 % (ref 0.0–7.0)
Eosinophils Absolute: 0.1 10*3/uL (ref 0.0–0.5)
HCT: 40.8 % (ref 38.4–49.9)
HEMOGLOBIN: 13.8 g/dL (ref 13.0–17.1)
LYMPH#: 1.6 10*3/uL (ref 0.9–3.3)
LYMPH%: 22.1 % (ref 14.0–49.0)
MCH: 31.9 pg (ref 27.2–33.4)
MCHC: 33.8 g/dL (ref 32.0–36.0)
MCV: 94.4 fL (ref 79.3–98.0)
MONO#: 0.4 10*3/uL (ref 0.1–0.9)
MONO%: 5.6 % (ref 0.0–14.0)
NEUT#: 4.9 10*3/uL (ref 1.5–6.5)
NEUT%: 70 % (ref 39.0–75.0)
NRBC: 0 % (ref 0–0)
Platelets: 240 10*3/uL (ref 140–400)
RBC: 4.32 10*6/uL (ref 4.20–5.82)
RDW: 13.2 % (ref 11.0–14.6)
WBC: 7 10*3/uL (ref 4.0–10.3)

## 2013-09-21 LAB — COMPREHENSIVE METABOLIC PANEL (CC13)
ALT: 16 U/L (ref 0–55)
AST: 21 U/L (ref 5–34)
Albumin: 3.2 g/dL — ABNORMAL LOW (ref 3.5–5.0)
Alkaline Phosphatase: 64 U/L (ref 40–150)
Anion Gap: 8 mEq/L (ref 3–11)
BILIRUBIN TOTAL: 0.46 mg/dL (ref 0.20–1.20)
BUN: 10.7 mg/dL (ref 7.0–26.0)
CALCIUM: 9.4 mg/dL (ref 8.4–10.4)
CHLORIDE: 107 meq/L (ref 98–109)
CO2: 24 meq/L (ref 22–29)
Creatinine: 0.6 mg/dL — ABNORMAL LOW (ref 0.7–1.3)
Glucose: 84 mg/dl (ref 70–140)
POTASSIUM: 4.4 meq/L (ref 3.5–5.1)
SODIUM: 139 meq/L (ref 136–145)
TOTAL PROTEIN: 6.5 g/dL (ref 6.4–8.3)

## 2013-09-21 MED ORDER — DIPHENHYDRAMINE HCL 50 MG/ML IJ SOLN
INTRAMUSCULAR | Status: AC
  Filled 2013-09-21: qty 1

## 2013-09-21 MED ORDER — SODIUM CHLORIDE 0.9 % IV SOLN
204.2000 mg | Freq: Once | INTRAVENOUS | Status: AC
  Administered 2013-09-21: 200 mg via INTRAVENOUS
  Filled 2013-09-21: qty 20

## 2013-09-21 MED ORDER — SODIUM CHLORIDE 0.9 % IV SOLN
Freq: Once | INTRAVENOUS | Status: AC
  Administered 2013-09-21: 11:00:00 via INTRAVENOUS

## 2013-09-21 MED ORDER — DEXAMETHASONE SODIUM PHOSPHATE 10 MG/ML IJ SOLN
10.0000 mg | Freq: Once | INTRAMUSCULAR | Status: AC
  Administered 2013-09-21: 10 mg via INTRAVENOUS

## 2013-09-21 MED ORDER — DIPHENHYDRAMINE HCL 50 MG/ML IJ SOLN
25.0000 mg | Freq: Once | INTRAMUSCULAR | Status: AC
  Administered 2013-09-21: 25 mg via INTRAVENOUS

## 2013-09-21 MED ORDER — ONDANSETRON 16 MG/50ML IVPB (CHCC)
16.0000 mg | Freq: Once | INTRAVENOUS | Status: AC
  Administered 2013-09-21: 16 mg via INTRAVENOUS

## 2013-09-21 MED ORDER — FAMOTIDINE IN NACL 20-0.9 MG/50ML-% IV SOLN
20.0000 mg | Freq: Once | INTRAVENOUS | Status: AC
  Administered 2013-09-21: 20 mg via INTRAVENOUS

## 2013-09-21 MED ORDER — FAMOTIDINE IN NACL 20-0.9 MG/50ML-% IV SOLN
INTRAVENOUS | Status: AC
  Filled 2013-09-21: qty 50

## 2013-09-21 MED ORDER — ONDANSETRON 16 MG/50ML IVPB (CHCC)
INTRAVENOUS | Status: AC
  Filled 2013-09-21: qty 16

## 2013-09-21 MED ORDER — DEXAMETHASONE SODIUM PHOSPHATE 10 MG/ML IJ SOLN
INTRAMUSCULAR | Status: AC
  Filled 2013-09-21: qty 1

## 2013-09-21 MED ORDER — SODIUM CHLORIDE 0.9 % IV SOLN
50.0000 mg/m2 | Freq: Once | INTRAVENOUS | Status: AC
  Administered 2013-09-21: 84 mg via INTRAVENOUS
  Filled 2013-09-21: qty 14

## 2013-09-21 NOTE — Progress Notes (Signed)
Weekly radiation tx esophagus 5/30 completd, no skin changes, has biafine on hand not using as yet, does have some difficulty swallowing solids, drinks boost first thing in am, then 4cans total daily, did eat leon pie w/o crust and mashed potatoes yesterday, had chemotherapy today upstirs, no nausea, no pain, just sleepy stated, drinks boost and ensure  3:21 PM

## 2013-09-21 NOTE — Progress Notes (Signed)
Followup completed with patient during chemotherapy.  Patient reports a slight improvement in swallowing.  He was able to eat some chopped chicken yesterday.  He is drinking Ensure Plus at least 3 bottles daily but sometimes up to 5-6 bottles.  Weight has increased and was documented as 117.9 pounds April 13 from 115.8 pounds March 23.  Patient is requesting coupons for oral nutrition supplements.  Nutrition diagnosis: Inadequate oral intake has improved.  Intervention: Patient able to increase intake of blenderized foods as well as oral nutrition supplements for a 2 pound weight gain.  Provided patient with oral nutrition supplement samples, and coupons.  Support and encouragement provided.  Monitoring, evaluation, goals: Patient will tolerate continued increase in oral intake or continued weight gain.  Next visit: Monday, April 20, during chemotherapy.

## 2013-09-21 NOTE — Progress Notes (Signed)
  [place] [facility] OFFICE PROGRESS NOTE   Diagnosis:  Esophagus cancer.  INTERVAL HISTORY:   [name] began radiation on 09/14/2013. He began weekly Taxol/carboplatin on 09/15/2013. He denies nausea/vomiting. No mouth sores. He is typically constipated. He had loose stools yesterday. No numbness or tingling in his hands or feet. He continues to tolerate liquids and soft solids. He has mild odynophagia.  Objective:  Vital signs in last 24 hours:  Blood pressure 117/68, pulse 93, temperature 97.3 F (36.3 C), temperature source Oral, resp. rate 18, height 5\' 11"  (1.803 m), weight 117 lb 14.4 oz (53.479 kg), SpO2 99.00%.    HEENT: No thrush or ulcerations. Resp: Lungs clear.  Cardio: Regular cardiac rhythm. GI: Abdomen soft and nontender. No hepatomegaly. Vascular: No leg edema. Calves soft and nontender. Neuro: Vibratory sense mildly decreased over the fingertips per tuning fork exam.     Lab Results:  Lab Results  Component Value Date   WBC 7.0 09/21/2013   HGB 13.8 09/21/2013   HCT 40.8 09/21/2013   MCV 94.4 09/21/2013   PLT 240 09/21/2013   NEUTROABS 4.9 09/21/2013      Imaging:  No results found.  Medications: I have reviewed the patient's current medications.  Assessment/Plan: 1. 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  EGD by Dr. Collene Mares on 08/26/2013 with findings of a malignant appearing friable mass causing stenosis at 33 cm from the incisors extending to the GE junction. Stomach and the proximal small bowel were not visualized.  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  Staging PET scan on 09/03/2013 showed a 6.5 cm segment of intense hypermetabolic activity associated with the mid to distal esophagus with SUV max 24.5. There was no evidence of mediastinal metastasis, gastrohepatic ligament nodal metastasis or liver metastasis. Initiation of radiation 09/14/2013 and concurrent  weekly Taxol/carboplatin on 09/15/2013. 2. Dysphagia secondary to #1. 3. Weight loss secondary to #1. 4. Peripheral vascular disease. 5. History of mesenteric ischemia.   Disposition: He appears stable. He continues radiation. Plan to proceed with week 2 Taxol/carboplatin today as scheduled. He will return for week 3 Taxol/carboplatin on 09/28/2013. We will see him in followup prior to treatment on 10/05/2013. He will contact the office in the interim with any problems.  Plan reviewed with Dr. Benay Spice.   Owens Shark ANP/GNP-BC   09/21/2013  9:45 AM

## 2013-09-21 NOTE — Progress Notes (Signed)
Weekly Management Note:  Site: esophagus Current Dose:  900  cGy Projected Dose: 5400  cGy  Narrative: The patient is seen today for routine under treatment assessment. CBCT/MVCT images/port films were reviewed. The chart was reviewed.   He states that he does have mild discomfort on swallowing, but declines any medication. His weight is up 4 pounds over the past week. He had more chemotherapy today.  Physical Examination:  Filed Vitals:   09/21/13 1518  BP: 130/86  Pulse: 84  Temp: 98.2 F (36.8 C)  Resp: 20  .  Weight: 119 lb (53.978 kg). Oral cavity and oropharynx are unremarkable to inspection. There are no skin changes.  Laboratory data: Lab Results  Component Value Date   WBC 7.0 09/21/2013   HGB 13.8 09/21/2013   HCT 40.8 09/21/2013   MCV 94.4 09/21/2013   PLT 240 09/21/2013    Impression: Tolerating radiation therapy well.  Plan: Continue radiation therapy as planned.

## 2013-09-21 NOTE — Patient Instructions (Signed)
[facility] Discharge Instructions for Patients Receiving Chemotherapy  Today you received the following chemotherapy agents taxol, carboplatin  To help prevent nausea and vomiting after your treatment, we encourage you to take your nausea medication as needed   If you develop nausea and vomiting that is not controlled by your nausea medication, call the clinic.   BELOW ARE SYMPTOMS THAT SHOULD BE REPORTED IMMEDIATELY:  *FEVER GREATER THAN 100.5 F  *CHILLS WITH OR WITHOUT FEVER  NAUSEA AND VOMITING THAT IS NOT CONTROLLED WITH YOUR NAUSEA MEDICATION  *UNUSUAL SHORTNESS OF BREATH  *UNUSUAL BRUISING OR BLEEDING  TENDERNESS IN MOUTH AND THROAT WITH OR WITHOUT PRESENCE OF ULCERS  *URINARY PROBLEMS  *BOWEL PROBLEMS  UNUSUAL RASH Items with * indicate a potential emergency and should be followed up as soon as possible.  Feel free to call the clinic you have any questions or concerns. The clinic phone number is [phone].    

## 2013-09-21 NOTE — Telephone Encounter (Signed)
[facility] PT APPT SCHEDULE FOR APRIL/MAY. TX ADDED FOR 5/4 AND 5/11 DUE TO [place] AND PER PT HE WILL CONTINUE TX.

## 2013-09-22 ENCOUNTER — Ambulatory Visit
Admission: RE | Admit: 2013-09-22 | Discharge: 2013-09-22 | Disposition: A | Discharge status: Home / Self Care | Payer: 59 | Source: Ambulatory Visit | Attending: Radiation Oncology | Admitting: Radiation Oncology

## 2013-09-23 ENCOUNTER — Ambulatory Visit
Admission: RE | Admit: 2013-09-23 | Discharge: 2013-09-23 | Disposition: A | Discharge status: Home / Self Care | Payer: 59 | Source: Ambulatory Visit | Attending: Radiation Oncology | Admitting: Radiation Oncology

## 2013-09-23 ENCOUNTER — Ambulatory Visit (HOSPITAL_COMMUNITY)
Admission: RE | Admit: 2013-09-23 | Discharge: 2013-09-23 | Disposition: A | Discharge status: Home / Self Care | Payer: 59 | Source: Ambulatory Visit | Attending: Cardiothoracic Surgery | Admitting: Cardiothoracic Surgery

## 2013-09-23 DIAGNOSIS — J988 Other specified respiratory disorders: Secondary | ICD-10-CM | POA: Insufficient documentation

## 2013-09-23 DIAGNOSIS — C159 Malignant neoplasm of esophagus, unspecified: Secondary | ICD-10-CM | POA: Insufficient documentation

## 2013-09-23 DIAGNOSIS — C155 Malignant neoplasm of lower third of esophagus: Secondary | ICD-10-CM

## 2013-09-23 MED ORDER — ALBUTEROL SULFATE (2.5 MG/3ML) 0.083% IN NEBU
2.5000 mg | INHALATION_SOLUTION | Freq: Once | RESPIRATORY_TRACT | Status: AC
  Administered 2013-09-23: 2.5 mg via RESPIRATORY_TRACT

## 2013-09-23 NOTE — Telephone Encounter (Signed)
Open in error

## 2013-09-24 ENCOUNTER — Ambulatory Visit
Admission: RE | Admit: 2013-09-24 | Discharge: 2013-09-24 | Disposition: A | Discharge status: Home / Self Care | Payer: 59 | Source: Ambulatory Visit | Attending: Radiation Oncology | Admitting: Radiation Oncology

## 2013-09-25 ENCOUNTER — Encounter: Payer: Self-pay | Admitting: Radiation Oncology

## 2013-09-25 ENCOUNTER — Ambulatory Visit
Admission: RE | Admit: 2013-09-25 | Discharge: 2013-09-25 | Disposition: A | Discharge status: Home / Self Care | Payer: 59 | Source: Ambulatory Visit | Attending: Radiation Oncology | Admitting: Radiation Oncology

## 2013-09-25 VITALS — BP 134/83 | HR 87 | Temp 98.2°F | Resp 20 | Wt 122.1 lb

## 2013-09-25 DIAGNOSIS — C155 Malignant neoplasm of lower third of esophagus: Secondary | ICD-10-CM

## 2013-09-25 NOTE — Progress Notes (Signed)
   Department of Radiation Oncology  Phone:  [phone] Fax:        [phone]  Weekly Treatment Note    Name: Brandon [name] Date: 09/25/2013 MRN: [id number] DOB: [birth year]   Current dose: 16.2 Gy  Current fraction: 9   MEDICATIONS: Current Outpatient Prescriptions  Medication Sig Dispense Refill  . aspirin EC 81 MG tablet Take 81 mg by mouth daily.      Marland Kitchen dexamethasone (DECADRON) 4 MG tablet Take 2.5 tabs (10 mg) at 10 pm the night before first chemo and 6 am the day of first chemo.  5 tablet  0  . emollient (BIAFINE) cream Apply 1 application topically daily. Apply to skin area after rad tx daily and prn      . ibuprofen (ADVIL,MOTRIN) 200 MG tablet Take 200 mg by mouth every 6 (six) hours as needed.      . lactose free nutrition (BOOST) LIQD Take 237 mLs by mouth 2 (two) times daily between meals.      . prochlorperazine (COMPAZINE) 10 MG tablet Take 1 tablet (10 mg total) by mouth every 6 (six) hours as needed.  60 tablet  1   No current facility-administered medications for this encounter.     ALLERGIES: Review of patient's allergies indicates no known allergies.   LABORATORY DATA:  Lab Results  Component Value Date   WBC 7.0 09/21/2013   HGB 13.8 09/21/2013   HCT 40.8 09/21/2013   MCV 94.4 09/21/2013   PLT 240 09/21/2013   Lab Results  Component Value Date   NA 139 09/21/2013   K 4.4 09/21/2013   CL 103 11/26/2011   CO2 24 09/21/2013   Lab Results  Component Value Date   ALT 16 09/21/2013   AST 21 09/21/2013   ALKPHOS 64 09/21/2013   BILITOT 0.46 09/21/2013     NARRATIVE: Brandon [name] was seen today for weekly treatment management. The chart was checked and the patient's films were reviewed. The patient is doing well in his second week of treatment. He has increased his intake of solid food some. He has gained 5 pounds over the last week. No pain.  PHYSICAL EXAMINATION: weight is 122 lb 1.6 oz (55.384 kg). His oral temperature is 98.2 F (36.8 C). His  blood pressure is 134/83 and his pulse is 87. His respiration is 20.        ASSESSMENT: The patient is doing satisfactorily with treatment.  PLAN: We will continue with the patient's radiation treatment as planned.

## 2013-09-25 NOTE — Addendum Note (Signed)
Encounter addended by: Marye Round, MD on: 09/25/2013  4:11 PM<BR>     Documentation filed: Notes Section, Follow-up Section, LOS Section, Visit Diagnoses

## 2013-09-25 NOTE — Progress Notes (Signed)
Weekly rad tx esophagus, no skin changess, had 7 gfish sticks last night, eggs/gravy this m and mashed potatoes&gravy for lunch, drinking ensure Plus/and Boost, gatir aid and mtn dew in am, water at work, slight fatigued, no nausea,  Gain of 5 lbs since last week, no pain 3:23 PM

## 2013-09-25 NOTE — Progress Notes (Signed)
  Radiation Oncology         [phone] ________________________________  Name: Brandon [name] MRN: [id number]  Date: 09/04/2013  DOB: 07/30/1955  SIMULATION AND TREATMENT PLANNING NOTE  DIAGNOSIS:  Esophageal cancer  Site:  Esophagus  NARRATIVE:  The patient was brought to the [facility] suite.  Identity was confirmed.  All relevant records and images related to the planned course of therapy were reviewed.   Written consent to proceed with treatment was confirmed which was freely given after reviewing the details related to the planned course of therapy had been reviewed with the patient.  Then, the patient was set-up in a stable reproducible  supine position for radiation therapy.  CT images were obtained.  Surface markings were placed.    Medically necessary complex treatment device(s) for immobilization:  Customized VAC lock bag.   The CT images were loaded into the planning software.  Then the target and avoidance structures were contoured.  Treatment planning then occurred.  The radiation prescription was entered and confirmed.  A total of 2 complex treatment devices were fabricated which relate to the designed radiation treatment fields. Each of these customized fields/ complex treatment devices will be used on a daily basis during the radiation course. I have requested : Intensity Modulated Radiotherapy (IMRT) is medically necessary for this case for the following reason:  Adequate dose to the target volume while sparing critical normal structures including the spinal cord, heart, and lungs which are adjacent to the target.   The patient will undergo daily image guidance to ensure accurate localization of the target, and adequate minimize dose to the normal surrounding structures in close proximity to the target.   PLAN:  The patient will receive 54 Gy in 30 fractions.   Special treatment procedure The patient will also receive concurrent chemotherapy during the  treatment. The patient may therefore experience increased toxicity or side effects and the patient will be monitored for such problems. This may require extra lab work as necessary. This therefore constitutes a special treatment procedure.  ________________________________   Jodelle Gross, MD, PhD

## 2013-09-27 ENCOUNTER — Other Ambulatory Visit: Payer: Self-pay | Admitting: Oncology

## 2013-09-28 ENCOUNTER — Other Ambulatory Visit (HOSPITAL_BASED_OUTPATIENT_CLINIC_OR_DEPARTMENT_OTHER): Payer: 59

## 2013-09-28 ENCOUNTER — Ambulatory Visit: Payer: 59 | Admitting: Nutrition

## 2013-09-28 ENCOUNTER — Ambulatory Visit (HOSPITAL_BASED_OUTPATIENT_CLINIC_OR_DEPARTMENT_OTHER): Payer: 59

## 2013-09-28 ENCOUNTER — Ambulatory Visit
Admission: RE | Admit: 2013-09-28 | Discharge: 2013-09-28 | Disposition: A | Discharge status: Home / Self Care | Payer: 59 | Source: Ambulatory Visit | Attending: Radiation Oncology | Admitting: Radiation Oncology

## 2013-09-28 VITALS — BP 120/79 | HR 88 | Temp 98.3°F

## 2013-09-28 DIAGNOSIS — C159 Malignant neoplasm of esophagus, unspecified: Secondary | ICD-10-CM

## 2013-09-28 DIAGNOSIS — Z5111 Encounter for antineoplastic chemotherapy: Secondary | ICD-10-CM

## 2013-09-28 DIAGNOSIS — C155 Malignant neoplasm of lower third of esophagus: Secondary | ICD-10-CM

## 2013-09-28 LAB — PULMONARY FUNCTION TEST
DL/VA % pred: 52 %
DL/VA: 2.45 ml/min/mmHg/L
DLCO cor % pred: 60 %
DLCO cor: 20.42 ml/min/mmHg
DLCO unc % pred: 59 %
DLCO unc: 19.94 ml/min/mmHg
FEF 25-75 Post: 1.99 L/sec
FEF 25-75 Pre: 1.92 L/sec
FEF2575-%Change-Post: 3 %
FEF2575-%Pred-Post: 62 %
FEF2575-%Pred-Pre: 60 %
FEV1-%Change-Post: 2 %
FEV1-%Pred-Post: 90 %
FEV1-%Pred-Pre: 88 %
FEV1-Post: 3.47 L
FEV1-Pre: 3.38 L
FEV1FVC-%Change-Post: 2 %
FEV1FVC-%Pred-Pre: 82 %
FEV6-%Change-Post: 0 %
FEV6-%Pred-Post: 110 %
FEV6-%Pred-Pre: 110 %
FEV6-Post: 5.3 L
FEV6-Pre: 5.32 L
FEV6FVC-%Change-Post: 0 %
FEV6FVC-%Pred-Post: 102 %
FEV6FVC-%Pred-Pre: 102 %
FVC-%Change-Post: 0 %
FVC-%Pred-Post: 107 %
FVC-%Pred-Pre: 107 %
FVC-Post: 5.41 L
FVC-Pre: 5.41 L
Post FEV1/FVC ratio: 64 %
Post FEV6/FVC ratio: 98 %
Pre FEV1/FVC ratio: 62 %
Pre FEV6/FVC Ratio: 98 %
RV % pred: 116 %
RV: 2.61 L
TLC % pred: 117 %
TLC: 8.43 L

## 2013-09-28 LAB — COMPREHENSIVE METABOLIC PANEL (CC13)
ALBUMIN: 3.2 g/dL — AB (ref 3.5–5.0)
ALT: 14 U/L (ref 0–55)
AST: 18 U/L (ref 5–34)
Alkaline Phosphatase: 63 U/L (ref 40–150)
Anion Gap: 7 mEq/L (ref 3–11)
BUN: 8.5 mg/dL (ref 7.0–26.0)
CO2: 26 mEq/L (ref 22–29)
CREATININE: 0.7 mg/dL (ref 0.7–1.3)
Calcium: 9.8 mg/dL (ref 8.4–10.4)
Chloride: 109 mEq/L (ref 98–109)
GLUCOSE: 74 mg/dL (ref 70–140)
POTASSIUM: 4 meq/L (ref 3.5–5.1)
Sodium: 142 mEq/L (ref 136–145)
Total Bilirubin: 0.4 mg/dL (ref 0.20–1.20)
Total Protein: 6.5 g/dL (ref 6.4–8.3)

## 2013-09-28 LAB — CBC WITH DIFFERENTIAL/PLATELET
BASO%: 1.7 % (ref 0.0–2.0)
BASOS ABS: 0.1 10*3/uL (ref 0.0–0.1)
EOS%: 1.3 % (ref 0.0–7.0)
Eosinophils Absolute: 0.1 10*3/uL (ref 0.0–0.5)
HEMATOCRIT: 38.8 % (ref 38.4–49.9)
HGB: 13.1 g/dL (ref 13.0–17.1)
LYMPH%: 20.6 % (ref 14.0–49.0)
MCH: 32.1 pg (ref 27.2–33.4)
MCHC: 33.8 g/dL (ref 32.0–36.0)
MCV: 95.1 fL (ref 79.3–98.0)
MONO#: 0.5 10*3/uL (ref 0.1–0.9)
MONO%: 8.9 % (ref 0.0–14.0)
NEUT#: 3.5 10*3/uL (ref 1.5–6.5)
NEUT%: 67.5 % (ref 39.0–75.0)
PLATELETS: 196 10*3/uL (ref 140–400)
RBC: 4.08 10*6/uL — ABNORMAL LOW (ref 4.20–5.82)
RDW: 13.2 % (ref 11.0–14.6)
WBC: 5.2 10*3/uL (ref 4.0–10.3)
lymph#: 1.1 10*3/uL (ref 0.9–3.3)
nRBC: 0 % (ref 0–0)

## 2013-09-28 MED ORDER — SODIUM CHLORIDE 0.9 % IV SOLN
204.2000 mg | Freq: Once | INTRAVENOUS | Status: AC
  Administered 2013-09-28: 200 mg via INTRAVENOUS
  Filled 2013-09-28: qty 20

## 2013-09-28 MED ORDER — ONDANSETRON 16 MG/50ML IVPB (CHCC)
INTRAVENOUS | Status: AC
  Filled 2013-09-28: qty 16

## 2013-09-28 MED ORDER — FAMOTIDINE IN NACL 20-0.9 MG/50ML-% IV SOLN
20.0000 mg | Freq: Once | INTRAVENOUS | Status: AC
Start: 2013-09-28 — End: 2013-09-28
  Administered 2013-09-28: 20 mg via INTRAVENOUS

## 2013-09-28 MED ORDER — DEXAMETHASONE SODIUM PHOSPHATE 10 MG/ML IJ SOLN
INTRAMUSCULAR | Status: AC
  Filled 2013-09-28: qty 1

## 2013-09-28 MED ORDER — ONDANSETRON 16 MG/50ML IVPB (CHCC)
16.0000 mg | Freq: Once | INTRAVENOUS | Status: AC
  Administered 2013-09-28: 16 mg via INTRAVENOUS

## 2013-09-28 MED ORDER — DEXAMETHASONE SODIUM PHOSPHATE 10 MG/ML IJ SOLN
10.0000 mg | Freq: Once | INTRAMUSCULAR | Status: AC
  Administered 2013-09-28: 10 mg via INTRAVENOUS

## 2013-09-28 MED ORDER — DIPHENHYDRAMINE HCL 50 MG/ML IJ SOLN
25.0000 mg | Freq: Once | INTRAMUSCULAR | Status: AC
  Administered 2013-09-28: 25 mg via INTRAVENOUS

## 2013-09-28 MED ORDER — DIPHENHYDRAMINE HCL 50 MG/ML IJ SOLN
INTRAMUSCULAR | Status: AC
  Filled 2013-09-28: qty 1

## 2013-09-28 MED ORDER — FAMOTIDINE IN NACL 20-0.9 MG/50ML-% IV SOLN
INTRAVENOUS | Status: AC
  Filled 2013-09-28: qty 50

## 2013-09-28 MED ORDER — SODIUM CHLORIDE 0.9 % IV SOLN
50.0000 mg/m2 | Freq: Once | INTRAVENOUS | Status: AC
  Administered 2013-09-28: 84 mg via INTRAVENOUS
  Filled 2013-09-28: qty 14

## 2013-09-28 MED ORDER — SODIUM CHLORIDE 0.9 % IV SOLN
Freq: Once | INTRAVENOUS | Status: AC
  Administered 2013-09-28: 09:00:00 via INTRAVENOUS

## 2013-09-28 NOTE — Patient Instructions (Signed)
[facility] Discharge Instructions for Patients Receiving Chemotherapy  Today you received the following chemotherapy agents Taxol/Carboplatin To help prevent nausea and vomiting after your treatment, we encourage you to take your nausea medication as prescribed.  If you develop nausea and vomiting that is not controlled by your nausea medication, call the clinic.   BELOW ARE SYMPTOMS THAT SHOULD BE REPORTED IMMEDIATELY:  *FEVER GREATER THAN 100.5 F  *CHILLS WITH OR WITHOUT FEVER  NAUSEA AND VOMITING THAT IS NOT CONTROLLED WITH YOUR NAUSEA MEDICATION  *UNUSUAL SHORTNESS OF BREATH  *UNUSUAL BRUISING OR BLEEDING  TENDERNESS IN MOUTH AND THROAT WITH OR WITHOUT PRESENCE OF ULCERS  *URINARY PROBLEMS  *BOWEL PROBLEMS  UNUSUAL RASH Items with * indicate a potential emergency and should be followed up as soon as possible.  Feel free to call the clinic you have any questions or concerns. The clinic phone number is [phone].    

## 2013-09-28 NOTE — Progress Notes (Signed)
Nutrition followup completed with patient during chemotherapy.  Patient has been diagnosed with cancer of the distal esophagus/GE junction.  He is receiving chemotherapy and radiation treatments.  Patient denies nutrition side effects except continued difficulty swallowing.  He is drinking 5-6 bottles of Ensure Plus or boost daily.  Weight has increased to 122.1 pounds on April 17 up from 115.8 pounds March 23.  Patient requesting coupons, and samples of nutrition supplements.  Nutrition diagnosis: Inadequate oral intake has improved.  Intervention: Encouraged patient to continue intake of blenderized foods, as well as oral nutrition supplements 5-6 daily.  Provided patient with second complimentary case of Ensure Plus and coupons for oral nutrition supplements.  Teach back method used.  Monitoring, evaluation, goals: Patient will continue to tolerate increased oral intake and oral nutrition supplements for continued weight gain.  Next visit: Monday, April 27, during chemotherapy.

## 2013-09-28 NOTE — Progress Notes (Signed)
Met with patient during infusion. He stated he is doing okay.  He has gained some weight and stated he is really trying hard to eat and drink the supplements that were recommenced by dietician.  He continues to work and stated that his employer is supportive of him.  He continues to drive himself to radiation every day and denies need for assistance.  This RN reminded patient he can call for assistance with transportation or other needs.  He verbalized understanding and stated he would call if barriers to care arise.  Will continue to follow.

## 2013-09-29 ENCOUNTER — Ambulatory Visit
Admission: RE | Admit: 2013-09-29 | Discharge: 2013-09-29 | Disposition: A | Discharge status: Home / Self Care | Payer: 59 | Source: Ambulatory Visit | Attending: Radiation Oncology | Admitting: Radiation Oncology

## 2013-09-30 ENCOUNTER — Ambulatory Visit
Admission: RE | Admit: 2013-09-30 | Discharge: 2013-09-30 | Disposition: A | Discharge status: Home / Self Care | Payer: 59 | Source: Ambulatory Visit | Attending: Radiation Oncology | Admitting: Radiation Oncology

## 2013-10-01 ENCOUNTER — Ambulatory Visit
Admission: RE | Admit: 2013-10-01 | Discharge: 2013-10-01 | Disposition: A | Discharge status: Home / Self Care | Payer: 59 | Source: Ambulatory Visit | Attending: Radiation Oncology | Admitting: Radiation Oncology

## 2013-10-02 ENCOUNTER — Ambulatory Visit
Admission: RE | Admit: 2013-10-02 | Discharge: 2013-10-02 | Disposition: A | Discharge status: Home / Self Care | Payer: 59 | Source: Ambulatory Visit | Attending: Radiation Oncology | Admitting: Radiation Oncology

## 2013-10-02 ENCOUNTER — Ambulatory Visit: Payer: 59 | Admitting: Radiation Oncology

## 2013-10-04 ENCOUNTER — Other Ambulatory Visit: Payer: Self-pay | Admitting: Oncology

## 2013-10-05 ENCOUNTER — Ambulatory Visit: Payer: 59 | Admitting: Nutrition

## 2013-10-05 ENCOUNTER — Other Ambulatory Visit: Payer: Self-pay | Admitting: *Deleted

## 2013-10-05 ENCOUNTER — Other Ambulatory Visit: Payer: 59

## 2013-10-05 ENCOUNTER — Ambulatory Visit
Admission: RE | Admit: 2013-10-05 | Discharge: 2013-10-05 | Disposition: A | Discharge status: Home / Self Care | Payer: 59 | Source: Ambulatory Visit | Attending: Radiation Oncology | Admitting: Radiation Oncology

## 2013-10-05 ENCOUNTER — Ambulatory Visit (HOSPITAL_BASED_OUTPATIENT_CLINIC_OR_DEPARTMENT_OTHER): Payer: 59 | Admitting: Nurse Practitioner

## 2013-10-05 ENCOUNTER — Other Ambulatory Visit (HOSPITAL_BASED_OUTPATIENT_CLINIC_OR_DEPARTMENT_OTHER): Payer: 59

## 2013-10-05 ENCOUNTER — Ambulatory Visit (HOSPITAL_BASED_OUTPATIENT_CLINIC_OR_DEPARTMENT_OTHER): Payer: 59

## 2013-10-05 ENCOUNTER — Telehealth: Payer: Self-pay | Admitting: Oncology

## 2013-10-05 VITALS — BP 111/69 | HR 100 | Temp 97.6°F | Resp 18 | Ht 71.0 in | Wt 120.9 lb

## 2013-10-05 DIAGNOSIS — C159 Malignant neoplasm of esophagus, unspecified: Secondary | ICD-10-CM

## 2013-10-05 DIAGNOSIS — C155 Malignant neoplasm of lower third of esophagus: Secondary | ICD-10-CM

## 2013-10-05 DIAGNOSIS — Z5111 Encounter for antineoplastic chemotherapy: Secondary | ICD-10-CM

## 2013-10-05 DIAGNOSIS — R1319 Other dysphagia: Secondary | ICD-10-CM

## 2013-10-05 LAB — CBC WITH DIFFERENTIAL/PLATELET
BASO%: 2.7 % — AB (ref 0.0–2.0)
BASOS ABS: 0.1 10*3/uL (ref 0.0–0.1)
EOS%: 2.5 % (ref 0.0–7.0)
Eosinophils Absolute: 0.1 10*3/uL (ref 0.0–0.5)
HCT: 36 % — ABNORMAL LOW (ref 38.4–49.9)
HGB: 12.2 g/dL — ABNORMAL LOW (ref 13.0–17.1)
LYMPH#: 0.7 10*3/uL — AB (ref 0.9–3.3)
LYMPH%: 18.2 % (ref 14.0–49.0)
MCH: 31.9 pg (ref 27.2–33.4)
MCHC: 33.9 g/dL (ref 32.0–36.0)
MCV: 94 fL (ref 79.3–98.0)
MONO#: 0.5 10*3/uL (ref 0.1–0.9)
MONO%: 11.6 % (ref 0.0–14.0)
NEUT#: 2.6 10*3/uL (ref 1.5–6.5)
NEUT%: 65 % (ref 39.0–75.0)
Platelets: 193 10*3/uL (ref 140–400)
RBC: 3.83 10*6/uL — AB (ref 4.20–5.82)
RDW: 13.4 % (ref 11.0–14.6)
WBC: 4.1 10*3/uL (ref 4.0–10.3)

## 2013-10-05 LAB — COMPREHENSIVE METABOLIC PANEL (CC13)
ALK PHOS: 63 U/L (ref 40–150)
ALT: 18 U/L (ref 0–55)
AST: 18 U/L (ref 5–34)
Albumin: 3.3 g/dL — ABNORMAL LOW (ref 3.5–5.0)
Anion Gap: 10 mEq/L (ref 3–11)
BUN: 8.4 mg/dL (ref 7.0–26.0)
CALCIUM: 9.8 mg/dL (ref 8.4–10.4)
CHLORIDE: 107 meq/L (ref 98–109)
CO2: 24 mEq/L (ref 22–29)
Creatinine: 0.7 mg/dL (ref 0.7–1.3)
Glucose: 115 mg/dl (ref 70–140)
POTASSIUM: 3.8 meq/L (ref 3.5–5.1)
SODIUM: 141 meq/L (ref 136–145)
TOTAL PROTEIN: 6.5 g/dL (ref 6.4–8.3)
Total Bilirubin: 0.24 mg/dL (ref 0.20–1.20)

## 2013-10-05 MED ORDER — SODIUM CHLORIDE 0.9 % IV SOLN
Freq: Once | INTRAVENOUS | Status: AC
  Administered 2013-10-05: 13:00:00 via INTRAVENOUS

## 2013-10-05 MED ORDER — FAMOTIDINE IN NACL 20-0.9 MG/50ML-% IV SOLN
INTRAVENOUS | Status: AC
  Filled 2013-10-05: qty 50

## 2013-10-05 MED ORDER — DIPHENHYDRAMINE HCL 50 MG/ML IJ SOLN
INTRAMUSCULAR | Status: AC
  Filled 2013-10-05: qty 1

## 2013-10-05 MED ORDER — DEXAMETHASONE SODIUM PHOSPHATE 10 MG/ML IJ SOLN
10.0000 mg | Freq: Once | INTRAMUSCULAR | Status: AC
  Administered 2013-10-05: 10 mg via INTRAVENOUS

## 2013-10-05 MED ORDER — ONDANSETRON 16 MG/50ML IVPB (CHCC)
INTRAVENOUS | Status: AC
  Filled 2013-10-05: qty 16

## 2013-10-05 MED ORDER — DEXAMETHASONE SODIUM PHOSPHATE 10 MG/ML IJ SOLN
INTRAMUSCULAR | Status: AC
  Filled 2013-10-05: qty 1

## 2013-10-05 MED ORDER — SODIUM CHLORIDE 0.9 % IV SOLN
204.2000 mg | Freq: Once | INTRAVENOUS | Status: AC
  Administered 2013-10-05: 200 mg via INTRAVENOUS
  Filled 2013-10-05: qty 20

## 2013-10-05 MED ORDER — PACLITAXEL CHEMO INJECTION 300 MG/50ML
50.0000 mg/m2 | Freq: Once | INTRAVENOUS | Status: AC
  Administered 2013-10-05: 84 mg via INTRAVENOUS
  Filled 2013-10-05: qty 14

## 2013-10-05 MED ORDER — DIPHENHYDRAMINE HCL 50 MG/ML IJ SOLN
25.0000 mg | Freq: Once | INTRAMUSCULAR | Status: AC
  Administered 2013-10-05: 25 mg via INTRAVENOUS

## 2013-10-05 MED ORDER — FAMOTIDINE IN NACL 20-0.9 MG/50ML-% IV SOLN
20.0000 mg | Freq: Once | INTRAVENOUS | Status: AC
  Administered 2013-10-05: 20 mg via INTRAVENOUS

## 2013-10-05 MED ORDER — ONDANSETRON 16 MG/50ML IVPB (CHCC)
16.0000 mg | Freq: Once | INTRAVENOUS | Status: AC
  Administered 2013-10-05: 16 mg via INTRAVENOUS

## 2013-10-05 NOTE — Progress Notes (Signed)
Patient denies nutritional complications.  No nausea, vomiting, diarrhea, or constipation.  He does have some pain at the GE junction when he swallows.  He is eating a variety of foods including porkchops, eggs, pancakes, and ice cream.  He has been drinking about 4 cans of Ensure Plus a day.  He backed off on this over the weekend but states he is going to pick it up again.  Weight documented as 120.9 pounds on April 27 from 122.1 pounds April 17.  Nutrition diagnosis: Inadequate oral intake continues.  Intervention: I encouraged patient to continue to chew foods well and eat soft, moist foods throughout the day.  Provided patient with his third complementary case of Ensure Plus.  Questions were answered.  Teach back method used.  Monitoring, evaluation, goals: Patient will continue to tolerate adequate calories and protein for weight gain.  Next visit: Monday, May 4, during chemotherapy.

## 2013-10-05 NOTE — Progress Notes (Signed)
  [facility] OFFICE PROGRESS NOTE   Diagnosis:  Esophagus cancer.  INTERVAL HISTORY:   He returns as scheduled. He completed week 3 Taxol/carboplatin on 09/28/2013. He denies nausea/vomiting. No mouth sores. Occasional loose stools. He occasionally notes mild numbness/tingling in the fingertips. Dysphagia is markedly improved. He continues to have mild odynophagia.  Objective:  Vital signs in last 24 hours:  Blood pressure 111/69, pulse 100, temperature 97.6 F (36.4 C), temperature source Oral, resp. rate 18, height 5\' 11"  (1.803 m), weight 120 lb 14.4 oz (54.84 kg), SpO2 99.00%.    HEENT:  no thrush or ulceration. Resp:  lungs clear.  Cardio:  regular cardiac rhythm. GI:  abdomen soft and nontender. No hepatomegaly.  Vascular:  no leg edema.  Neuro:Vibratory sense mildly decreased over the fingertips per tuning fork exam.   Skin:No skin rash.    Lab Results:  Lab Results  Component Value Date   WBC 4.1 10/05/2013   HGB 12.2* 10/05/2013   HCT 36.0* 10/05/2013   MCV 94.0 10/05/2013   PLT 193 10/05/2013   NEUTROABS 2.6 10/05/2013    Imaging:  No results found.  Medications: I have reviewed the patient's current medications.  Assessment/Plan: 1. 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  EGD by Dr. Collene Mares on 08/26/2013 with findings of a malignant appearing friable mass causing stenosis at 33 cm from the incisors extending to the GE junction. Stomach and the proximal small bowel were not visualized.  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  Staging PET scan on 09/03/2013 showed a 6.5 cm segment of intense hypermetabolic activity associated with the mid to distal esophagus with SUV max 24.5. There was no evidence of mediastinal metastasis, gastrohepatic ligament nodal metastasis or liver metastasis.  Initiation of radiation 09/14/2013 and concurrent weekly Taxol/carboplatin on  09/15/2013. 2. Dysphagia secondary to #1. Improved. 3. Weight loss secondary to #1. Weight is stable. 4. Peripheral vascular disease. 5. History of mesenteric ischemia. 6. Mild odynophagia secondary to radiation.    Disposition: he appears stable. He continues radiation. Plan to proceed with week 4 Taxol/carboplatin today as scheduled.   He has mild odynophagia. We discussed Carafate 1 g per 10 cc 4 times daily (before meals and at bedtime). He would like to try this but is concerned about the cost. We will contact his pharmacy and let him know the expense.   He is scheduled for week 5 Taxol/carboplatin on 10/12/2013 (final chemotherapy). He has a followup visit in 2 weeks. He will contact the office in the interim with any problems     Owens Shark ANP/GNP-BC   10/05/2013  11:46 AM

## 2013-10-05 NOTE — [facility] Oncology Navigator Note (Signed)
Met with patient during clinic visit.  He states he has been eating more and has been trying to drink more supplements.  He continues to work and states his employer is supportive.  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  He talked about how caring for a parent is difficult especially since he is sick.  He stated he has siblings in the area who can help. He denies need for SW services at this time, or other referrals.  He understands to call for assistance.  Will continue to follow.

## 2013-10-05 NOTE — Progress Notes (Signed)
   Department of Radiation Oncology  Phone:  [phone] Fax:        [phone]  Weekly Treatment Note    Name: Brandon [name] Date: 10/05/2013 MRN: [id number] DOB: 05-Apr-1956   Current dose: 27 Gy  Current fraction: 15   MEDICATIONS: Current Outpatient Prescriptions  Medication Sig Dispense Refill  . aspirin EC 81 MG tablet Take 81 mg by mouth daily.      Marland Kitchen dexamethasone (DECADRON) 4 MG tablet Take 2.5 tabs (10 mg) at 10 pm the night before first chemo and 6 am the day of first chemo.  5 tablet  0  . emollient (BIAFINE) cream Apply 1 application topically daily. Apply to skin area after rad tx daily and prn      . ibuprofen (ADVIL,MOTRIN) 200 MG tablet Take 200 mg by mouth every 6 (six) hours as needed.      . lactose free nutrition (BOOST) LIQD Take 237 mLs by mouth 2 (two) times daily between meals.      . prochlorperazine (COMPAZINE) 10 MG tablet Take 1 tablet (10 mg total) by mouth every 6 (six) hours as needed.  60 tablet  1   No current facility-administered medications for this encounter.     ALLERGIES: Review of patient's allergies indicates no known allergies.   LABORATORY DATA:  Lab Results  Component Value Date   WBC 4.1 10/05/2013   HGB 12.2* 10/05/2013   HCT 36.0* 10/05/2013   MCV 94.0 10/05/2013   PLT 193 10/05/2013   Lab Results  Component Value Date   NA 141 10/05/2013   K 3.8 10/05/2013   CL 103 11/26/2011   CO2 24 10/05/2013   Lab Results  Component Value Date   ALT 18 10/05/2013   AST 18 10/05/2013   ALKPHOS 63 10/05/2013   BILITOT 0.24 10/05/2013     NARRATIVE: Brandon [name] was seen today for weekly treatment management. The chart was checked and the patient's films were reviewed. The patient is doing satisfactorily with treatment. He relates no significant difficulties. He left the clinic for chemotherapy today.  PHYSICAL EXAMINATION: vitals were not taken for this visit.     alert  ASSESSMENT: The patient is doing satisfactorily with  treatment.  PLAN: We will continue with the patient's radiation treatment as planned.

## 2013-10-05 NOTE — Telephone Encounter (Signed)
Gave pt appt for lab and MD for MAy 2015, cancelled chemo for 5/11 and 5/18

## 2013-10-05 NOTE — Patient Instructions (Addendum)
[facility] Discharge Instructions for Patients Receiving Chemotherapy  Today you received the following chemotherapy agents: taxol, carboplatin  To help prevent nausea and vomiting after your treatment, we encourage you to take your nausea medication.  Take it as often as prescribed.     If you develop nausea and vomiting that is not controlled by your nausea medication, call the clinic. If it is after clinic hours your family physician or the after hours number for the clinic or go to the Emergency Department.   BELOW ARE SYMPTOMS THAT SHOULD BE REPORTED IMMEDIATELY:  *FEVER GREATER THAN 100.5 F  *CHILLS WITH OR WITHOUT FEVER  NAUSEA AND VOMITING THAT IS NOT CONTROLLED WITH YOUR NAUSEA MEDICATION  *UNUSUAL SHORTNESS OF BREATH  *UNUSUAL BRUISING OR BLEEDING  TENDERNESS IN MOUTH AND THROAT WITH OR WITHOUT PRESENCE OF ULCERS  *URINARY PROBLEMS  *BOWEL PROBLEMS  UNUSUAL RASH Items with * indicate a potential emergency and should be followed up as soon as possible.  Feel free to call the clinic you have any questions or concerns. The clinic phone number is [phone].   I have been informed and understand all the instructions given to me. I know to contact the clinic, my physician, or go to the Emergency Department if any problems should occur. I do not have any questions at this time, but understand that I may call the clinic during office hours   should I have any questions or need assistance in obtaining follow up care.    __________________________________________  _____________  __________ Signature of Patient or Authorized Representative            Date                   Time    __________________________________________ Nurse's Signature    

## 2013-10-06 ENCOUNTER — Ambulatory Visit
Admission: RE | Admit: 2013-10-06 | Discharge: 2013-10-06 | Disposition: A | Discharge status: Home / Self Care | Payer: 59 | Source: Ambulatory Visit | Attending: Radiation Oncology | Admitting: Radiation Oncology

## 2013-10-07 ENCOUNTER — Ambulatory Visit
Admission: RE | Admit: 2013-10-07 | Discharge: 2013-10-07 | Disposition: A | Discharge status: Home / Self Care | Payer: 59 | Source: Ambulatory Visit | Attending: Radiation Oncology | Admitting: Radiation Oncology

## 2013-10-08 ENCOUNTER — Ambulatory Visit
Admission: RE | Admit: 2013-10-08 | Discharge: 2013-10-08 | Disposition: A | Discharge status: Home / Self Care | Payer: 59 | Source: Ambulatory Visit | Attending: Radiation Oncology | Admitting: Radiation Oncology

## 2013-10-09 ENCOUNTER — Ambulatory Visit
Admission: RE | Admit: 2013-10-09 | Discharge: 2013-10-09 | Disposition: A | Discharge status: Home / Self Care | Payer: 59 | Source: Ambulatory Visit | Attending: Radiation Oncology | Admitting: Radiation Oncology

## 2013-10-09 ENCOUNTER — Encounter: Payer: Self-pay | Admitting: Radiation Oncology

## 2013-10-09 VITALS — BP 152/84 | HR 88 | Temp 98.2°F | Resp 20 | Wt 123.4 lb

## 2013-10-09 DIAGNOSIS — C155 Malignant neoplasm of lower third of esophagus: Secondary | ICD-10-CM

## 2013-10-09 NOTE — Progress Notes (Signed)
   Department of Radiation Oncology  Phone:  [phone] Fax:        [phone]  Weekly Treatment Note    Name: Brandon [name] Date: 10/09/2013 MRN: [id number] DOB: Dec 19, 1955   Current dose: 34.2 Gy  Current fraction: 19   MEDICATIONS: Current Outpatient Prescriptions  Medication Sig Dispense Refill  . aspirin EC 81 MG tablet Take 81 mg by mouth daily.      Marland Kitchen emollient (BIAFINE) cream Apply 1 application topically daily. Apply to skin area after rad tx daily and prn      . ibuprofen (ADVIL,MOTRIN) 200 MG tablet Take 200 mg by mouth every 6 (six) hours as needed.      . lactose free nutrition (BOOST) LIQD Take 237 mLs by mouth 2 (two) times daily between meals.      . prochlorperazine (COMPAZINE) 10 MG tablet Take 1 tablet (10 mg total) by mouth every 6 (six) hours as needed.  60 tablet  1  . dexamethasone (DECADRON) 4 MG tablet Take 2.5 tabs (10 mg) at 10 pm the night before first chemo and 6 am the day of first chemo.  5 tablet  0   No current facility-administered medications for this encounter.     ALLERGIES: Review of patient's allergies indicates no known allergies.   LABORATORY DATA:  Lab Results  Component Value Date   WBC 4.1 10/05/2013   HGB 12.2* 10/05/2013   HCT 36.0* 10/05/2013   MCV 94.0 10/05/2013   PLT 193 10/05/2013   Lab Results  Component Value Date   NA 141 10/05/2013   K 3.8 10/05/2013   CL 103 11/26/2011   CO2 24 10/05/2013   Lab Results  Component Value Date   ALT 18 10/05/2013   AST 18 10/05/2013   ALKPHOS 63 10/05/2013   BILITOT 0.24 10/05/2013     NARRATIVE: Brandon [name] was seen today for weekly treatment management. The chart was checked and the patient's films were reviewed. The patient is doing well he states this week. He is experiencing some esophagitis. He has not been able to use Carafate to 2 cost. However, the patient is swallowing quite well a variety of foods.  PHYSICAL EXAMINATION: weight is 123 lb 6.4 oz (55.974 kg). His  oral temperature is 98.2 F (36.8 C). His blood pressure is 152/84 and his pulse is 88. His respiration is 20.        ASSESSMENT: The patient is doing satisfactorily with treatment.  PLAN: We will continue with the patient's radiation treatment as planned. We are going to see if carafate 8 pills may be sufficiently inexpensive for him.

## 2013-10-09 NOTE — Progress Notes (Addendum)
Weekly rad txs, esophagus 19 completed, c/o burning after eating, not taking any carafte says too exspensive bottle liquid=$84.00, will see if pill form acan be called in for him, eating most foods, had pork chops last night, no nausea, appetite is still good  Though , energy level pretty good states patient,  Gets a little tired end of the day no skin changes, not using biafine as yet stated 3:11 PM

## 2013-10-11 ENCOUNTER — Other Ambulatory Visit: Payer: Self-pay | Admitting: Oncology

## 2013-10-12 ENCOUNTER — Other Ambulatory Visit (HOSPITAL_BASED_OUTPATIENT_CLINIC_OR_DEPARTMENT_OTHER): Payer: 59

## 2013-10-12 ENCOUNTER — Ambulatory Visit: Payer: 59 | Admitting: Nutrition

## 2013-10-12 ENCOUNTER — Ambulatory Visit (HOSPITAL_BASED_OUTPATIENT_CLINIC_OR_DEPARTMENT_OTHER): Payer: 59

## 2013-10-12 ENCOUNTER — Ambulatory Visit
Admission: RE | Admit: 2013-10-12 | Discharge: 2013-10-12 | Disposition: A | Discharge status: Home / Self Care | Payer: 59 | Source: Ambulatory Visit | Attending: Radiation Oncology | Admitting: Radiation Oncology

## 2013-10-12 VITALS — BP 123/81 | HR 88 | Temp 97.1°F | Resp 18

## 2013-10-12 DIAGNOSIS — C159 Malignant neoplasm of esophagus, unspecified: Secondary | ICD-10-CM

## 2013-10-12 DIAGNOSIS — C155 Malignant neoplasm of lower third of esophagus: Secondary | ICD-10-CM

## 2013-10-12 DIAGNOSIS — Z5111 Encounter for antineoplastic chemotherapy: Secondary | ICD-10-CM

## 2013-10-12 LAB — CBC WITH DIFFERENTIAL/PLATELET
BASO%: 1.8 % (ref 0.0–2.0)
Basophils Absolute: 0.1 10*3/uL (ref 0.0–0.1)
EOS%: 1.9 % (ref 0.0–7.0)
Eosinophils Absolute: 0.1 10*3/uL (ref 0.0–0.5)
HEMATOCRIT: 34.3 % — AB (ref 38.4–49.9)
HGB: 11.5 g/dL — ABNORMAL LOW (ref 13.0–17.1)
LYMPH%: 18.6 % (ref 14.0–49.0)
MCH: 32.1 pg (ref 27.2–33.4)
MCHC: 33.6 g/dL (ref 32.0–36.0)
MCV: 95.7 fL (ref 79.3–98.0)
MONO#: 0.3 10*3/uL (ref 0.1–0.9)
MONO%: 6.9 % (ref 0.0–14.0)
NEUT#: 2.8 10*3/uL (ref 1.5–6.5)
NEUT%: 70.8 % (ref 39.0–75.0)
PLATELETS: 176 10*3/uL (ref 140–400)
RBC: 3.58 10*6/uL — ABNORMAL LOW (ref 4.20–5.82)
RDW: 13.4 % (ref 11.0–14.6)
WBC: 3.9 10*3/uL — AB (ref 4.0–10.3)
lymph#: 0.7 10*3/uL — ABNORMAL LOW (ref 0.9–3.3)

## 2013-10-12 MED ORDER — SODIUM CHLORIDE 0.9 % IV SOLN
Freq: Once | INTRAVENOUS | Status: AC
  Administered 2013-10-12: 08:00:00 via INTRAVENOUS

## 2013-10-12 MED ORDER — PACLITAXEL CHEMO INJECTION 300 MG/50ML
50.0000 mg/m2 | Freq: Once | INTRAVENOUS | Status: AC
  Administered 2013-10-12: 84 mg via INTRAVENOUS
  Filled 2013-10-12: qty 14

## 2013-10-12 MED ORDER — DIPHENHYDRAMINE HCL 50 MG/ML IJ SOLN
25.0000 mg | Freq: Once | INTRAMUSCULAR | Status: AC
  Administered 2013-10-12: 25 mg via INTRAVENOUS

## 2013-10-12 MED ORDER — DIPHENHYDRAMINE HCL 50 MG/ML IJ SOLN
INTRAMUSCULAR | Status: AC
  Filled 2013-10-12: qty 1

## 2013-10-12 MED ORDER — ONDANSETRON 16 MG/50ML IVPB (CHCC)
16.0000 mg | Freq: Once | INTRAVENOUS | Status: AC
  Administered 2013-10-12: 16 mg via INTRAVENOUS

## 2013-10-12 MED ORDER — SODIUM CHLORIDE 0.9 % IV SOLN
Freq: Once | INTRAVENOUS | Status: AC
  Administered 2013-10-12: 09:00:00 via INTRAVENOUS

## 2013-10-12 MED ORDER — DEXAMETHASONE SODIUM PHOSPHATE 10 MG/ML IJ SOLN
INTRAMUSCULAR | Status: AC
  Filled 2013-10-12: qty 1

## 2013-10-12 MED ORDER — ONDANSETRON 16 MG/50ML IVPB (CHCC)
INTRAVENOUS | Status: AC
  Filled 2013-10-12: qty 16

## 2013-10-12 MED ORDER — FAMOTIDINE IN NACL 20-0.9 MG/50ML-% IV SOLN
20.0000 mg | Freq: Once | INTRAVENOUS | Status: AC
  Administered 2013-10-12: 20 mg via INTRAVENOUS

## 2013-10-12 MED ORDER — FAMOTIDINE IN NACL 20-0.9 MG/50ML-% IV SOLN
INTRAVENOUS | Status: AC
  Filled 2013-10-12: qty 50

## 2013-10-12 MED ORDER — SODIUM CHLORIDE 0.9 % IV SOLN
204.2000 mg | Freq: Once | INTRAVENOUS | Status: AC
  Administered 2013-10-12: 200 mg via INTRAVENOUS
  Filled 2013-10-12: qty 20

## 2013-10-12 MED ORDER — DEXAMETHASONE SODIUM PHOSPHATE 10 MG/ML IJ SOLN
10.0000 mg | Freq: Once | INTRAMUSCULAR | Status: AC
  Administered 2013-10-12: 10 mg via INTRAVENOUS

## 2013-10-12 NOTE — Progress Notes (Signed)
Patient states he has not ate well yesterday and has not ate or drank this morning due to no appetite. Blood pressure taking in a sitting, lying and standing position. Patient states he feels really dizzy upon standing. Patient to get 1 liter of normal saline today with treatment.  1045 Patient states he no longer feels dizzy upon standing.

## 2013-10-12 NOTE — Progress Notes (Signed)
Nutrition followup completed with patient in chemotherapy.  Patient is being treated for cancer of the distal esophagus/GE junction.  Patient reports he continues to eat a variety of foods.  He continues to drink Ensure Plus daily, 3-4 cans.  Weight has increased and was documented as 123.4 pounds May 1.  This from 120.9 pounds April 27.  Patient reports pain is controlled.  No nutrition issues.  Nutrition diagnosis: Inadequate oral intake improved.  Intervention: Patient educated to continue to chew foods well and eat soft, moist foods throughout the day.  Encouraged patient to continue Ensure plus between meals and strive for weight maintenance or weight gain.  Questions were answered.  Teach back method used.  Monitoring, evaluation, goals: Patient is tolerating increased calories and protein and has achieved weight gain.  Next visit: No followup scheduled.  Patient has my contact information for questions or concerns.

## 2013-10-12 NOTE — Patient Instructions (Signed)
[facility] Discharge Instructions for Patients Receiving Chemotherapy  Today you received the following chemotherapy agents Taxol/Carboplatin To help prevent nausea and vomiting after your treatment, we encourage you to take your nausea medication as prescribed.  If you develop nausea and vomiting that is not controlled by your nausea medication, call the clinic.   BELOW ARE SYMPTOMS THAT SHOULD BE REPORTED IMMEDIATELY:  *FEVER GREATER THAN 100.5 F  *CHILLS WITH OR WITHOUT FEVER  NAUSEA AND VOMITING THAT IS NOT CONTROLLED WITH YOUR NAUSEA MEDICATION  *UNUSUAL SHORTNESS OF BREATH  *UNUSUAL BRUISING OR BLEEDING  TENDERNESS IN MOUTH AND THROAT WITH OR WITHOUT PRESENCE OF ULCERS  *URINARY PROBLEMS  *BOWEL PROBLEMS  UNUSUAL RASH Items with * indicate a potential emergency and should be followed up as soon as possible.  Feel free to call the clinic you have any questions or concerns. The clinic phone number is [phone].    

## 2013-10-13 ENCOUNTER — Ambulatory Visit
Admission: RE | Admit: 2013-10-13 | Discharge: 2013-10-13 | Disposition: A | Discharge status: Home / Self Care | Payer: 59 | Source: Ambulatory Visit | Attending: Radiation Oncology | Admitting: Radiation Oncology

## 2013-10-14 ENCOUNTER — Ambulatory Visit
Admission: RE | Admit: 2013-10-14 | Discharge: 2013-10-14 | Disposition: A | Discharge status: Home / Self Care | Payer: 59 | Source: Ambulatory Visit | Attending: Radiation Oncology | Admitting: Radiation Oncology

## 2013-10-15 ENCOUNTER — Ambulatory Visit
Admission: RE | Admit: 2013-10-15 | Discharge: 2013-10-15 | Disposition: A | Discharge status: Home / Self Care | Payer: 59 | Source: Ambulatory Visit | Attending: Radiation Oncology | Admitting: Radiation Oncology

## 2013-10-16 ENCOUNTER — Ambulatory Visit
Admission: RE | Admit: 2013-10-16 | Discharge: 2013-10-16 | Disposition: A | Discharge status: Home / Self Care | Payer: 59 | Source: Ambulatory Visit | Attending: Radiation Oncology | Admitting: Radiation Oncology

## 2013-10-16 ENCOUNTER — Encounter: Payer: Self-pay | Admitting: Radiation Oncology

## 2013-10-16 VITALS — BP 107/74 | HR 84 | Temp 98.1°F | Resp 20 | Wt 124.0 lb

## 2013-10-16 DIAGNOSIS — C155 Malignant neoplasm of lower third of esophagus: Secondary | ICD-10-CM

## 2013-10-16 MED ORDER — SUCRALFATE 1 G PO TABS: 1.0000 g | ORAL_TABLET | Freq: Four times a day (QID) | ORAL | Status: DC

## 2013-10-16 NOTE — Progress Notes (Signed)
weekly rad txs esophagus 24/30 completed,  Has difficulty  After swallowing certain foods burns bottom of esophagus , needs rx for carafte pill form ,"Liquid too exspensive states patient", eats porkchops, drinks  3 cans ensure  Daily, + , gravy bisquit for breakfast, drinks water and mtn dew,, biafine on chest area bid, no skin changes no c/o pain or nausea today .noqw

## 2013-10-16 NOTE — Progress Notes (Signed)
   Department of Radiation Oncology  Phone:  [phone] Fax:        [phone]  Weekly Treatment Note    Name: Brandon [name] Date: 10/16/2013 MRN: [id number] DOB: 12-13-1955   Current dose: 43.2 Gy  Current fraction: 24   MEDICATIONS: Current Outpatient Prescriptions  Medication Sig Dispense Refill  . aspirin EC 81 MG tablet Take 81 mg by mouth daily.      Marland Kitchen emollient (BIAFINE) cream Apply 1 application topically daily. Apply to skin area after rad tx daily and prn      . ibuprofen (ADVIL,MOTRIN) 200 MG tablet Take 200 mg by mouth every 6 (six) hours as needed.      . lactose free nutrition (BOOST) LIQD Take 237 mLs by mouth 2 (two) times daily between meals.      Marland Kitchen dexamethasone (DECADRON) 4 MG tablet Take 2.5 tabs (10 mg) at 10 pm the night before first chemo and 6 am the day of first chemo.  5 tablet  0  . prochlorperazine (COMPAZINE) 10 MG tablet Take 1 tablet (10 mg total) by mouth every 6 (six) hours as needed.  60 tablet  1  . sucralfate (CARAFATE) 1 G tablet Take 1 tablet (1 g total) by mouth 4 (four) times daily.  120 tablet  1   No current facility-administered medications for this encounter.     ALLERGIES: Review of patient's allergies indicates no known allergies.   LABORATORY DATA:  Lab Results  Component Value Date   WBC 3.9* 10/12/2013   HGB 11.5* 10/12/2013   HCT 34.3* 10/12/2013   MCV 95.7 10/12/2013   PLT 176 10/12/2013   Lab Results  Component Value Date   NA 141 10/05/2013   K 3.8 10/05/2013   CL 103 11/26/2011   CO2 24 10/05/2013   Lab Results  Component Value Date   ALT 18 10/05/2013   AST 18 10/05/2013   ALKPHOS 63 10/05/2013   BILITOT 0.24 10/05/2013     NARRATIVE: Brandon [name] was seen today for weekly treatment management. The chart was checked and the patient's films were reviewed. The patient is complaining of some esophagitis. We discussed Carafate before and having this medication in pill form would be best for him. He was given a  prescription and we discussed how to take this medication. No other complaints. Skin doing well.  PHYSICAL EXAMINATION: weight is 124 lb (56.246 kg). His oral temperature is 98.1 F (36.7 C). His blood pressure is 107/74 and his pulse is 84. His respiration is 20.        ASSESSMENT: The patient is doing satisfactorily with treatment.  PLAN: We will continue with the patient's radiation treatment as planned.

## 2013-10-19 ENCOUNTER — Other Ambulatory Visit (HOSPITAL_BASED_OUTPATIENT_CLINIC_OR_DEPARTMENT_OTHER): Payer: 59

## 2013-10-19 ENCOUNTER — Ambulatory Visit
Admission: RE | Admit: 2013-10-19 | Discharge: 2013-10-19 | Disposition: A | Discharge status: Home / Self Care | Payer: 59 | Source: Ambulatory Visit | Attending: Radiation Oncology | Admitting: Radiation Oncology

## 2013-10-19 ENCOUNTER — Encounter: Payer: 59 | Admitting: Nutrition

## 2013-10-19 ENCOUNTER — Ambulatory Visit: Payer: 59

## 2013-10-19 ENCOUNTER — Ambulatory Visit (HOSPITAL_BASED_OUTPATIENT_CLINIC_OR_DEPARTMENT_OTHER): Payer: 59 | Admitting: Oncology

## 2013-10-19 VITALS — BP 100/61 | HR 96 | Temp 98.4°F | Resp 18 | Ht 71.0 in | Wt 121.5 lb

## 2013-10-19 DIAGNOSIS — C155 Malignant neoplasm of lower third of esophagus: Secondary | ICD-10-CM

## 2013-10-19 DIAGNOSIS — R1310 Dysphagia, unspecified: Secondary | ICD-10-CM

## 2013-10-19 DIAGNOSIS — C159 Malignant neoplasm of esophagus, unspecified: Secondary | ICD-10-CM

## 2013-10-19 LAB — CBC WITH DIFFERENTIAL/PLATELET
BASO%: 1.6 % (ref 0.0–2.0)
Basophils Absolute: 0 10*3/uL (ref 0.0–0.1)
EOS%: 1.9 % (ref 0.0–7.0)
Eosinophils Absolute: 0.1 10*3/uL (ref 0.0–0.5)
HEMATOCRIT: 31.3 % — AB (ref 38.4–49.9)
HGB: 10.3 g/dL — ABNORMAL LOW (ref 13.0–17.1)
LYMPH%: 19.1 % (ref 14.0–49.0)
MCH: 31.9 pg (ref 27.2–33.4)
MCHC: 33 g/dL (ref 32.0–36.0)
MCV: 96.5 fL (ref 79.3–98.0)
MONO#: 0.3 10*3/uL (ref 0.1–0.9)
MONO%: 10.6 % (ref 0.0–14.0)
NEUT#: 1.8 10*3/uL (ref 1.5–6.5)
NEUT%: 66.8 % (ref 39.0–75.0)
Platelets: 144 10*3/uL (ref 140–400)
RBC: 3.25 10*6/uL — ABNORMAL LOW (ref 4.20–5.82)
RDW: 13.7 % (ref 11.0–14.6)
WBC: 2.7 10*3/uL — AB (ref 4.0–10.3)
lymph#: 0.5 10*3/uL — ABNORMAL LOW (ref 0.9–3.3)

## 2013-10-19 LAB — COMPREHENSIVE METABOLIC PANEL (CC13)
ALT: 13 U/L (ref 0–55)
ANION GAP: 7 meq/L (ref 3–11)
AST: 16 U/L (ref 5–34)
Albumin: 3 g/dL — ABNORMAL LOW (ref 3.5–5.0)
Alkaline Phosphatase: 59 U/L (ref 40–150)
BILIRUBIN TOTAL: 0.3 mg/dL (ref 0.20–1.20)
BUN: 9.7 mg/dL (ref 7.0–26.0)
CALCIUM: 9.4 mg/dL (ref 8.4–10.4)
CHLORIDE: 108 meq/L (ref 98–109)
CO2: 25 meq/L (ref 22–29)
CREATININE: 0.8 mg/dL (ref 0.7–1.3)
GLUCOSE: 113 mg/dL (ref 70–140)
Potassium: 4 mEq/L (ref 3.5–5.1)
Sodium: 141 mEq/L (ref 136–145)
Total Protein: 6 g/dL — ABNORMAL LOW (ref 6.4–8.3)

## 2013-10-19 MED ORDER — HYDROCODONE-ACETAMINOPHEN 7.5-325 MG/15ML PO SOLN: 10.0000 mL | Freq: Four times a day (QID) | ORAL | Status: DC | PRN

## 2013-10-19 NOTE — Progress Notes (Signed)
  [facility] OFFICE PROGRESS NOTE   Diagnosis: Esophagus cancer  INTERVAL HISTORY:   Brandon [name] returns as scheduled. He completed a fifth week of chemotherapy 10/12/2013. He reports mild numbness and tingling in the fingers. The dysphagia has improved. He now has pain with swallowing. Carafate has not helped. Mild erythema over the chest.  Objective:  Vital signs in last 24 hours:  Blood pressure 100/61, pulse 96, temperature 98.4 F (36.9 C), temperature source Oral, resp. rate 18, height 5\' 11"  (1.803 m), weight 121 lb 8 oz (55.112 kg), SpO2 100.00%.    HEENT: No thrush or ulcers, mucous membranes are moist Resp: Distant breath sounds, clear bilaterally Cardio: Regular rate and rhythm GI: No hepatomegaly, nontender Vascular: No leg edema Neuro: The vibratory sense is intact at the fingertips bilaterally  Skin: Mild erythema at the right upper back and left upper anterior chest   Portacath/PICC-without erythema  Lab Results:  Lab Results  Component Value Date   WBC 2.7* 10/19/2013   HGB 10.3* 10/19/2013   HCT 31.3* 10/19/2013   MCV 96.5 10/19/2013   PLT 144 10/19/2013   NEUTROABS 1.8 10/19/2013    Lab Results  Component Value Date   NA 141 10/19/2013    No results found for this basename: CEA,  CA199,  CA125    Imaging:  No results found.  Medications: I have reviewed the patient's current medications.  Assessment/Plan: 1. 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  EGD by Dr. Collene Mares on 08/26/2013 with findings of a malignant appearing friable mass causing stenosis at 33 cm from the incisors extending to the GE junction. Stomach and the proximal small bowel were not visualized.  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  Staging PET scan on 09/03/2013 showed a 6.5 cm segment of intense hypermetabolic activity associated with the mid to distal esophagus with SUV max 24.5. There  was no evidence of mediastinal metastasis, gastrohepatic ligament nodal metastasis or liver metastasis.  Initiation of radiation 09/14/2013 and concurrent weekly Taxol/carboplatin on 09/15/2013. 2. Dysphagia secondary to #1. Improved. 3. Weight loss secondary to #1. Weight is stable. 4. Peripheral vascular disease. 5. History of mesenteric ischemia. 6. odynophagia secondary to radiation.    Disposition:  Brandon [name] has completed the planned course of chemotherapy. He has developed increased odynophagia. We prescribed hydrocodone elixir.  He is scheduled to complete radiation 10/26/2013. He will see Dr. Servando Snare later this week. Brandon [name] will return for an office visit  in 3 weeks.  Ladell Pier, MD  10/19/2013  1:19 PM

## 2013-10-20 ENCOUNTER — Telehealth: Payer: Self-pay | Admitting: Oncology

## 2013-10-20 ENCOUNTER — Ambulatory Visit
Admission: RE | Admit: 2013-10-20 | Discharge: 2013-10-20 | Disposition: A | Discharge status: Home / Self Care | Payer: 59 | Source: Ambulatory Visit | Attending: Radiation Oncology | Admitting: Radiation Oncology

## 2013-10-20 NOTE — Telephone Encounter (Signed)
per pof to sch appt/cld & spoke w/pt and gave time & date for appt

## 2013-10-21 ENCOUNTER — Ambulatory Visit
Admission: RE | Admit: 2013-10-21 | Discharge: 2013-10-21 | Disposition: A | Discharge status: Home / Self Care | Payer: 59 | Source: Ambulatory Visit | Attending: Radiation Oncology | Admitting: Radiation Oncology

## 2013-10-22 ENCOUNTER — Encounter: Payer: Self-pay | Admitting: Cardiothoracic Surgery

## 2013-10-22 ENCOUNTER — Ambulatory Visit (INDEPENDENT_AMBULATORY_CARE_PROVIDER_SITE_OTHER): Payer: 59 | Admitting: Cardiothoracic Surgery

## 2013-10-22 ENCOUNTER — Ambulatory Visit
Admission: RE | Admit: 2013-10-22 | Discharge: 2013-10-22 | Disposition: A | Discharge status: Home / Self Care | Payer: 59 | Source: Ambulatory Visit | Attending: Radiation Oncology | Admitting: Radiation Oncology

## 2013-10-22 VITALS — BP 126/76 | HR 90 | Resp 20 | Ht 71.0 in | Wt 125.0 lb

## 2013-10-22 DIAGNOSIS — C155 Malignant neoplasm of lower third of esophagus: Secondary | ICD-10-CM

## 2013-10-22 NOTE — Progress Notes (Signed)
[address]       [place] [address]             [phone]                       Albeiro R Leeder Holloway Medical Record [id number] Date of Birth: [birth year]  Referring: Owens Shark, NP Primary Care: No PCP Per Patient  Chief Complaint:    Chief Complaint  Patient presents with  . Esophageal Cancer    1 month f/u, finished Chemo, discuss surgery    History of Present Illness:    Brandon [name] 58 y.o. 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 The patient notes that he has had difficulty swallowing for approximately 6 months. He has lost a weight 140 to 114 over 3 months. He denies any melena or hematochezia. The patient was referred to GI medicine and proceeded to undergo an upper endoscopy. A malignant appearing friable mass resulting in stenosis was noted at 33 centimeters from the incisors. This extended to the GE junction. The stomach and the proximal small bowel were not visualized. The esophageal stricture extended from 33-40 cm. Biopsy was obtained and this has returned positive for invasive squamous cell carcinoma, moderately differentiated. Esophageal ultrasound could not be done. The patient has a long-term history of smoking and continues to smoke. He has reduced his alcohol intake to a 12 pack per week over the past several months. Prior to this he notes he was drinking a 12 pack of beer a day. He's able to take liquids and some but no solid food.   The patient's history is complicated by known peripheral vascular disease and mesenteric ischemia. In 2009 he underwent aortobifemoral bypass and bypass to the superior mesenteric artery. 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 The patient denies any mesenteric ischemic symptoms currently.   Patient has improved po intake, now eating  liquids and solids well. He does have  pain on swallowing biscuits.  He completed chemo therapy  10/12/2013 and will complete radiation 10/26/2013.    Wt Readings from Last 3 Encounters:  10/22/13 125 lb (56.7 kg)  10/19/13 121 lb 8 oz (55.112 kg)  10/16/13 124 lb (56.246 kg)    Current Activity/ Functional Status:  Patient is independent with mobility/ambulation, transfers, ADL's, IADL's.   Zubrod Score: At the time of surgery this patient's most appropriate activity status/level should be described as: [x]     0    Normal activity, no symptoms []     1    Restricted in physical strenuous activity but ambulatory, able to do out light work []     2    Ambulatory and capable of self care, unable to do work activities, up and about               >50 % of waking hours                              []     3    Only limited self care, in bed greater than 50% of waking hours []     4    Completely disabled, no self care, confined to bed or chair []   5    Moribund   Past Medical History  Diagnosis Date      . Cancer 08/26/13    esophageal   . ETOH abuse     Past Surgical History  Procedure Laterality Date  .         DATE OF PROCEDURE: 07/29/2007  OPERATIVE REPORT  PROCEDURE:  1. Aortobifemoral bypass.  2. Aorta to the superior mesenteric artery bypass.  PREOPERATIVE DIAGNOSES:  1. Claudication with early rest pain, right foot.  2. Chronic mesenteric ischemia.  POSTOPERATIVE DIAGNOSES:  1. Claudication with early rest pain, right foot.  2. Chronic mesenteric ischemia. BYPASS GRAFT EVALUATION  INDICATION: Followup, aortobifem and aorta-to-SMA bypass graft.  HISTORY:  Diabetes: No.  Cardiac: No.  Hypertension: Yes.  Smoking: Yes.  Previous Surgery: Aortobifem and aorta-to-SMA bypass grafts on  07/29/07.  SINGLE LEVEL ARTERIAL EXAM  RIGHT LEFT  Brachial: 168 172  Anterior tibial: 164 148  Posterior tibial: 148 150  Peroneal:  Ankle/brachial index: 0.95 0.87  PREVIOUS ABI:  Date: 08/20/07 RIGHT: 0.92 LEFT: 0.87  LOWER EXTREMITY BYPASS GRAFT DUPLEX EXAM:  DUPLEX:  1. Biphasic to monophasic waveforms noted throughout the aortobifem  bypass grafts and their native vessels with no evidence of  increased velocities noted.  2. Patent aorta-to-SMA bypass graft with no increase in velocities  noted.  3. Elevated velocity of 231 cm/s noted in the proximal celiac artery.  IMPRESSION:  1. Patent aortobifemoral and aorta-to-superior mesenteric artery  bypass grafts with no evidence of stenosis noted.  2. 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  3. Stable bilateral ankle brachial indices noted.  4. No significant change noted since the previous examinations on  08/13/07 and 08/20/07.  ___________________________________________  Jessy Oto. Fields, MD  CH/MEDQ D: 01/28/2008 T: 01/28/2008 Job: [id number]    Family History  Problem Relation Age of Onset  . Stroke Mother     History   Social History  . Marital Status: Divorced    Spouse Name: N/A    Number of Children: N/A  . Years of Education: N/A   Occupational History  . Not on file.   Social History Main Topics  . Smoking status: Current Every Day Smoker -- 1.00 packs/day for 40 years    Types: Cigarettes    Start date: 08/31/2013  . Smokeless tobacco: Not on file     Comment: down to 1 cigarette daily  . Alcohol Use: Yes     Comment: 12 beer weekly  since last 3 months  . Drug Use: No  . Sexual Activity: Not on file   Other Topics Concern  . Not on file   Social History Narrative  . No narrative on file    History  Smoking status  . Current Every Day Smoker -- 1.00 packs/day for 40 years  . Types: Cigarettes  . Start date: 08/31/2013  Smokeless tobacco  . Not on file    Comment: down to 1 cigarette daily    History  Alcohol Use  . Yes    Comment: 12 beer weekly  since last 3 months     No Known Allergies  Current Outpatient  Prescriptions  Medication Sig Dispense Refill  . aspirin EC 81 MG tablet Take 81 mg by mouth daily.      Marland Kitchen dexamethasone (DECADRON) 4 MG tablet Take 2.5 tabs (10 mg) at 10 pm the night before first chemo and 6 am the day of first chemo.  5 tablet  0  . emollient (BIAFINE) cream Apply 1 application topically daily. Apply to skin area after rad tx daily and prn      . HYDROcodone-acetaminophen (HYCET) 7.5-325 mg/15 ml solution Take 10-15 mLs by mouth 4 (four) times daily as needed for moderate pain.  500 mL  0  . ibuprofen (ADVIL,MOTRIN) 200 MG tablet Take 200 mg by mouth every 6 (six) hours as needed.      . lactose free nutrition (BOOST) LIQD Take 237 mLs by mouth 2 (two) times daily between meals.      . prochlorperazine (COMPAZINE) 10 MG tablet Take 1 tablet (10 mg total) by mouth every 6 (six) hours as needed.  60 tablet  1  . sucralfate (CARAFATE) 1 G tablet Take 1 tablet (1 g total) by mouth 4 (four) times daily.  120 tablet  1   No current facility-administered medications for this visit.     Review of Systems:     Cardiac Review of Systems: Y or N  Chest Pain [   n ]  Resting SOB [n] Exertional SOB  Blue.Reese  ]  Orthopnea Florencio.Farrier  ]   Pedal Edema Florencio.Farrier   ]    Palpitations Florencio.Farrier  ] Syncope  Florencio.Farrier  ]   Presyncope Florencio.Farrier   ]  General Review of Systems: [Y] = yes [  ]=no Constitional: recent weight change Minnie.Brome  ];  Wt loss over the last 3 months Aura.Spears   ] anorexia [  ]; fatigue [ y ]; nausea [  ]; night sweats [  ]; fever [  ]; or chills [  ];          Dental: poor dentition[ y]; Last Dentist visit:   Eye : blurred vision [  ]; diplopia [   ]; vision changes [  ];  Amaurosis fugax[  ]; Resp: cough [  ];  wheezing[  ];  hemoptysis[  ]; shortness of breath[  ]; paroxysmal nocturnal dyspnea[  ]; dyspnea on exertion[  ]; or orthopnea[  ];  GI:  gallstones[  ], vomiting[n  ];  dysphagia[ y ]; melena[  ];  hematochezia [  ]; heartburn[  ];   Hx of  Colonoscopy[ n ]; GU: kidney stones [  ]; hematuria[  ];   dysuria [  ];   nocturia[  ];  history of     obstruction [  ]; urinary frequency [  ]             Skin: rash, swelling[  ];, hair loss[  ];  peripheral edema[  ];  or itching[  ]; Musculosketetal: myalgias[  ];  joint swelling[  ];  joint erythema[  ];  joint pain[  ];  back pain[  ];  Heme/Lymph: bruising[  ];  bleeding[  ];  anemia[  ];  Neuro: TIA[  ];  headaches[  ];  stroke[  ];  vertigo[  ];  seizures[  ];   paresthesias[  ];  difficulty walking[ n ];  Psych:depression[  ]; anxiety[  ];  Endocrine: diabetes[ n ];  thyroid dysfunction[  ];  Immunizations: Flu up to date [ n ]; Pneumococcal up to date Florencio.Farrier  ];  Other:  Physical Exam: BP 126/76  Pulse 90  Resp 20  Ht 5\' 11"  (1.803 m)  Wt 125 lb (56.7 kg)  BMI 17.44 kg/m2  SpO2 98%  PHYSICAL EXAMINATION:  General appearance: alert, cooperative, appears older than stated age, cachectic  and no distress Neurologic: intact Heart: regular rate and rhythm, S1, S2 normal, no murmur, click, rub or gallop Lungs: clear to auscultation bilaterally and normal percussion bilaterally Abdomen: soft, non-tender; bowel sounds normal; no masses,  no organomegaly Extremities: extremities normal, atraumatic, no cyanosis or edema and Homans sign is negative, no sign of DVT Do not appreciate cervical or supraclavicular adenopathy no axillary adenopathy, no carotid bruits No abdominal bruit  Diagnostic Studies & Laboratory data:     Recent Radiology Findings:   Nm Pet Image Initial (pi) Skull Base To Thigh  09/03/2013   CLINICAL DATA:  Initial treatment strategy for esophageal mass.  EXAM: NUCLEAR MEDICINE PET SKULL BASE TO THIGH  TECHNIQUE: 5.7 mCi F-18 FDG was injected intravenously. Full-ring PET imaging was performed from the skull base to thigh after the radiotracer. CT data was obtained and used for attenuation correction and anatomic localization.  FASTING BLOOD GLUCOSE:  Value: 100 mg/dl  COMPARISON:  None.  FINDINGS: NECK  No hypermetabolic lymph nodes in the  neck.  CHEST  There is a 6.5 cm segment of intense hypermetabolic activity associated with the mid to distal esophagus with SUV max equals 24.5. The activity initiates just above the carina and terminates several cm from the GE junction. No hypermetabolic mediastinal lymph nodes.  No suspicious or metabolic pulmonary nodules.  There is a precarinal lymph node measuring 13 mm (image 73) without associated metabolic activity  ABDOMEN/PELVIS  No abnormal hypermetabolic activity within the liver, pancreas, adrenal glands, or spleen. No hypermetabolic lymph nodes in the abdomen or pelvis.  SKELETON  No hypermetabolic gastrohepatic ligament lymph nodes. No abnormal metabolic activity within the liver. No hypermetabolic abdominal pelvic nodes.  As on note of a aortic graft repair.  IMPRESSION: 1. Long segment of intense hypermetabolic activity within the mid to distal esophagus consistent with esophageal carcinoma. 2. No evidence of mediastinal metastasis, gastrohepatic ligament nodal metastasis or liver metastasis. 3. No distant metastasis.   Electronically Signed   By: Suzy Bouchard M.D.   On: 09/03/2013 09:45    Recent Lab Findings: Lab Results  Component Value Date   WBC 2.7* 10/19/2013   HGB 10.3* 10/19/2013   HCT 31.3* 10/19/2013   PLT 144 10/19/2013   GLUCOSE 113 10/19/2013   CHOL  Value: 105        ATP III CLASSIFICATION:  <200     mg/dL   Desirable  200-239  mg/dL   Borderline High  >=240    mg/dL   High 07/28/2007   TRIG 59 08/04/2007   HDL 32* 07/27/2007   LDLCALC  Value: 91        Total Cholesterol/HDL:CHD Risk Coronary Heart Disease Risk Table                     Men   Women  1/2 Average Risk   3.4   3.3 07/19/2007   ALT 13 10/19/2013   AST 16 10/19/2013   NA 141 10/19/2013   K 4.0 10/19/2013   CL 103 11/26/2011   CREATININE 0.8 10/19/2013   BUN 9.7 10/19/2013   CO2 25 10/19/2013   INR 1.1 07/29/2007   Surgical past from [facility] on [address] DS [id number] dated 08/28/2013 Reported as esophagus  lower third invasive squamous cell carcinoma moderately differentiated  Assessment / Plan:  Locally advanced squamous cell carcinoma of the mid and distal third of the esophagus History of aortobifem with SMA bypass for chronic mesenteric ischemia Long-term alcohol  and tobacco abuse-still smoking but decrease. Will plan to see back in 3 weeks with ct of chest and abdomen, consider surgical resection if not evidence of distant mets. Will contact Dr Oneida Alar concerning formal evaluation of mesenteric vasculature, will need to ensure adequate gastric perfusion before resection. With mid esophageal lesion will need rt thoracotomy for resection.  Grace Isaac MD      [address] [place] [address] Office [phone]   Beeper [phone]    10/22/2013 12:33 PM

## 2013-10-23 ENCOUNTER — Ambulatory Visit
Admission: RE | Admit: 2013-10-23 | Discharge: 2013-10-23 | Disposition: A | Discharge status: Home / Self Care | Payer: 59 | Source: Ambulatory Visit | Attending: Radiation Oncology | Admitting: Radiation Oncology

## 2013-10-23 ENCOUNTER — Ambulatory Visit: Payer: 59

## 2013-10-23 ENCOUNTER — Other Ambulatory Visit: Payer: Self-pay | Admitting: *Deleted

## 2013-10-23 VITALS — BP 131/76 | HR 80 | Temp 98.0°F | Ht 71.0 in | Wt 125.8 lb

## 2013-10-23 DIAGNOSIS — C159 Malignant neoplasm of esophagus, unspecified: Secondary | ICD-10-CM

## 2013-10-23 DIAGNOSIS — C155 Malignant neoplasm of lower third of esophagus: Secondary | ICD-10-CM

## 2013-10-23 NOTE — Progress Notes (Signed)
   Department of Radiation Oncology  Phone:  [phone] Fax:        [phone]  Weekly Treatment Note    Name: Brandon [name] Date: 10/23/2013 MRN: [id number] DOB: 03/07/1956   Current dose: 52.2 Gy  Current fraction: 29   MEDICATIONS: Current Outpatient Prescriptions  Medication Sig Dispense Refill  . aspirin EC 81 MG tablet Take 81 mg by mouth daily.      Marland Kitchen HYDROcodone-acetaminophen (HYCET) 7.5-325 mg/15 ml solution Take 10-15 mLs by mouth 4 (four) times daily as needed for moderate pain.  500 mL  0  . prochlorperazine (COMPAZINE) 10 MG tablet Take 1 tablet (10 mg total) by mouth every 6 (six) hours as needed.  60 tablet  1  . sucralfate (CARAFATE) 1 G tablet Take 1 tablet (1 g total) by mouth 4 (four) times daily.  120 tablet  1  . dexamethasone (DECADRON) 4 MG tablet Take 2.5 tabs (10 mg) at 10 pm the night before first chemo and 6 am the day of first chemo.  5 tablet  0  . emollient (BIAFINE) cream Apply 1 application topically daily. Apply to skin area after rad tx daily and prn      . ibuprofen (ADVIL,MOTRIN) 200 MG tablet Take 200 mg by mouth every 6 (six) hours as needed.      . lactose free nutrition (BOOST) LIQD Take 237 mLs by mouth 2 (two) times daily between meals.       No current facility-administered medications for this encounter.     ALLERGIES: Review of patient's allergies indicates no known allergies.   LABORATORY DATA:  Lab Results  Component Value Date   WBC 2.7* 10/19/2013   HGB 10.3* 10/19/2013   HCT 31.3* 10/19/2013   MCV 96.5 10/19/2013   PLT 144 10/19/2013   Lab Results  Component Value Date   NA 141 10/19/2013   K 4.0 10/19/2013   CL 103 11/26/2011   CO2 25 10/19/2013   Lab Results  Component Value Date   ALT 13 10/19/2013   AST 16 10/19/2013   ALKPHOS 59 10/19/2013   BILITOT 0.30 10/19/2013     NARRATIVE: Brandon [name] was seen today for weekly treatment management. The chart was checked and the patient's films were reviewed. The  patient is doing quite well he states. Liquid pain medicine is helping control his pain adequately at this time. He states he is swallowing well.  PHYSICAL EXAMINATION: height is 5\' 11"  (1.803 m) and weight is 125 lb 12.8 oz (57.063 kg). His oral temperature is 98 F (36.7 C). His blood pressure is 131/76 and his pulse is 80. His oxygen saturation is 100%.        ASSESSMENT: The patient is doing satisfactorily with treatment.  PLAN: We will continue with the patient's radiation treatment as planned. The patient will followup in our clinic in one month.

## 2013-10-23 NOTE — Progress Notes (Signed)
Brandon [name] has had 29 fractions to his esophagus.  He denies pan today.  He reports that he is taking hycet about twice a day and is able to eat about 10 minutes after taking it.  He said it keeps his chest from burining after eating.  His weight is stable today.  He is drinking 2 ensures per day.  He denies nausea and fatigue.  The skin on his chest is pink.  He has not started to use biafine yet.

## 2013-10-26 ENCOUNTER — Ambulatory Visit (HOSPITAL_BASED_OUTPATIENT_CLINIC_OR_DEPARTMENT_OTHER): Payer: 59

## 2013-10-26 ENCOUNTER — Encounter: Payer: 59 | Admitting: Nutrition

## 2013-10-26 ENCOUNTER — Encounter: Payer: Self-pay | Admitting: Radiation Oncology

## 2013-10-26 ENCOUNTER — Ambulatory Visit
Admission: RE | Admit: 2013-10-26 | Discharge: 2013-10-26 | Disposition: A | Discharge status: Home / Self Care | Payer: 59 | Source: Ambulatory Visit | Attending: Radiation Oncology | Admitting: Radiation Oncology

## 2013-11-05 ENCOUNTER — Encounter: Payer: Self-pay | Admitting: *Deleted

## 2013-11-05 ENCOUNTER — Other Ambulatory Visit: Payer: Self-pay | Admitting: *Deleted

## 2013-11-06 NOTE — Progress Notes (Signed)
  Radiation Oncology         [phone] ________________________________  Name: Brandon [name] MRN: [id number]  Date: 10/26/2013  DOB: 10-26-1955  End of Treatment Note  Diagnosis:   Esophageal cancer     Indication for treatment:  Curative       Radiation treatment dates:   09/14/2013 through 10/26/2013  Site/dose:   The patient was treated to the esophageal tumor and adjacent high risk nodal regions. This targeted in particular the distal esophagus/GE junction. The patient was treated using a IMRT technique using 2 fields. The patient was treated using daily image guidance. The patient was treated to 50.4 gray in 28 fractions at 1.8 gray per fraction.  Narrative: The patient tolerated radiation treatment relatively well.    The patient's swallowing did quite well during treatment including towards the end of treatment. Mild skin changes present.  Plan: The patient has completed radiation treatment. The patient will return to radiation oncology clinic for routine followup in one month. I advised the patient to call or return sooner if they have any questions or concerns related to their recovery or treatment. ________________________________  Jodelle Gross, M.D., Ph.D.

## 2013-11-10 ENCOUNTER — Ambulatory Visit
Admission: RE | Admit: 2013-11-10 | Discharge: 2013-11-10 | Disposition: A | Discharge status: Home / Self Care | Payer: 59 | Source: Ambulatory Visit | Attending: Cardiothoracic Surgery | Admitting: Cardiothoracic Surgery

## 2013-11-10 ENCOUNTER — Other Ambulatory Visit: Payer: Self-pay | Admitting: *Deleted

## 2013-11-10 ENCOUNTER — Other Ambulatory Visit: Payer: 59

## 2013-11-10 DIAGNOSIS — C159 Malignant neoplasm of esophagus, unspecified: Secondary | ICD-10-CM

## 2013-11-10 DIAGNOSIS — Z95828 Presence of other vascular implants and grafts: Secondary | ICD-10-CM

## 2013-11-10 MED ORDER — IOHEXOL 300 MG/ML  SOLN
100.0000 mL | Freq: Once | INTRAMUSCULAR | Status: AC | PRN
  Administered 2013-11-10: 100 mL via INTRAVENOUS

## 2013-11-11 ENCOUNTER — Telehealth: Payer: Self-pay | Admitting: Oncology

## 2013-11-11 ENCOUNTER — Ambulatory Visit (HOSPITAL_BASED_OUTPATIENT_CLINIC_OR_DEPARTMENT_OTHER): Payer: 59 | Admitting: Oncology

## 2013-11-11 VITALS — BP 130/70 | HR 88 | Temp 98.9°F | Resp 18 | Ht 71.0 in | Wt 119.0 lb

## 2013-11-11 DIAGNOSIS — C155 Malignant neoplasm of lower third of esophagus: Secondary | ICD-10-CM

## 2013-11-11 DIAGNOSIS — R634 Abnormal weight loss: Secondary | ICD-10-CM

## 2013-11-11 DIAGNOSIS — I739 Peripheral vascular disease, unspecified: Secondary | ICD-10-CM

## 2013-11-11 DIAGNOSIS — R1310 Dysphagia, unspecified: Secondary | ICD-10-CM

## 2013-11-11 DIAGNOSIS — C159 Malignant neoplasm of esophagus, unspecified: Secondary | ICD-10-CM

## 2013-11-11 NOTE — Telephone Encounter (Signed)
gv pt appt schedule for july prior to leaving today. per BS appt should be 1 month w/LT not 1 month and 2 month f/u.

## 2013-11-11 NOTE — Patient Instructions (Signed)

## 2013-11-11 NOTE — Progress Notes (Signed)
[place] OFFICE PROGRESS NOTE   Diagnosis: Esophagus cancer  INTERVAL HISTORY:   Brandon [name] returns as scheduled. He completed radiation 10/26/2013. He reports improvement in the odynophagia. He is no longer taking pain medication. He reports dysphagia with some solids. He continues to have a poor appetite. He is working.  He saw Dr. Servando Snare and is being evaluated for an esophagectomy.  Objective:  Vital signs in last 24 hours:  Blood pressure 130/70, pulse 88, temperature 98.9 F (37.2 C), temperature source Oral, resp. rate 18, height 5\' 11"  (1.803 m), weight 119 lb (53.978 kg), SpO2 100.00%.    HEENT: No thrush, neck without mass Lymphatics: No cervical or supraclavicular nodes. "Shotty "bilateral axillary nodes Resp: Distant breath sounds, scattered rhonchi, no respiratory distress Cardio: Regular rate and rhythm GI: No hepatomegaly, nontender Vascular: No leg edema  Skin: Radiation hyperpigmentation over the back   Portacath/PICC-without erythema  Lab Results:  Lab Results  Component Value Date   WBC 2.7* 10/19/2013   HGB 10.3* 10/19/2013   HCT 31.3* 10/19/2013   MCV 96.5 10/19/2013   PLT 144 10/19/2013   NEUTROABS 1.8 10/19/2013     Imaging:  Ct Chest W Contrast  11/10/2013   CLINICAL DATA:  Esophageal cancer. Status post chemotherapy and radiation therapy.  EXAM: CT CHEST, ABDOMEN, AND PELVIS WITH CONTRAST  TECHNIQUE: Multidetector CT imaging of the chest, abdomen and pelvis was performed following the standard protocol during bolus administration of intravenous contrast.  CONTRAST:  152mL OMNIPAQUE IOHEXOL 300 MG/ML  SOLN  COMPARISON:  Head CT 09/03/2013.  FINDINGS: CT CHEST FINDINGS  The chest wall is unremarkable and stable. No supraclavicular or axillary mass or adenopathy. The thyroid gland is normal. The bony thorax is intact. No destructive bone lesions or spinal canal compromise.  The heart is normal in size. No pericardial effusion. No  mediastinal or hilar mass or adenopathy. There is tortuosity and moderate atherosclerotic calcifications involving the thoracic aorta. Coronary artery calcifications are noted. The esophagus is grossly normal. I do not see an obvious mass or surrounding adenopathy. Mild mid esophageal wall thickening is vastly improved when compared to the prior PET-CT. This would suggest a good response to treatment. No enlarged mediastinal or hilar lymph nodes.  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 This has enlarged since the study of 2009 and appears stable since the PET-CT.  The lungs are clear of acute process. No worrisome pulmonary nodules or masses. Emphysematous changes are again demonstrated. No pleural effusion.  CT ABDOMEN AND PELVIS FINDINGS  The solid abdominal organs are unremarkable. No findings for metastatic disease. Gallbladder is normal. No common bowel duct dilatation. No enlarged gastrohepatic ligament or the celiac axis lymph nodes. No mesenteric or retroperitoneal mass or adenopathy. Stable surgical changes involving the abdominal aorta with a aortoiliac bypass graft. No complicating features.  The stomach, duodenum, small bowel and colon are unremarkable.  The bladder, prostate gland and seminal vesicles are unremarkable. No pelvic mass, adenopathy or free pelvic fluid collections. No inguinal mass or adenopathy.  The bony structures are unremarkable.  IMPRESSION: 1. Much improved appearance of the esophagus since the prior PET-CT suggesting a good response to therapy. Mild residual mid esophageal wall thickening is noted. No surrounding adenopathy. 2. Distal thoracic abdominal aortic aneurysm measuring 5.2 x 4.8 cm. This appears to be due to chronic dissection or IMH. 3. No findings for metastatic disease or adenopathy  in the abdomen/pelvis. 4. Surgical changes related to aortoiliac bypass graft surgery. No complicating  features.   Electronically Signed   By: Kalman Jewels M.D.   On: 11/10/2013 16:06   Ct Abdomen Pelvis W Contrast  11/10/2013   CLINICAL DATA:  Esophageal cancer. Status post chemotherapy and radiation therapy.  EXAM: CT CHEST, ABDOMEN, AND PELVIS WITH CONTRAST  TECHNIQUE: Multidetector CT imaging of the chest, abdomen and pelvis was performed following the standard protocol during bolus administration of intravenous contrast.  CONTRAST:  147mL OMNIPAQUE IOHEXOL 300 MG/ML  SOLN  COMPARISON:  Head CT 09/03/2013.  FINDINGS: CT CHEST FINDINGS  The chest wall is unremarkable and stable. No supraclavicular or axillary mass or adenopathy. The thyroid gland is normal. The bony thorax is intact. No destructive bone lesions or spinal canal compromise.  The heart is normal in size. No pericardial effusion. No mediastinal or hilar mass or adenopathy. There is tortuosity and moderate atherosclerotic calcifications involving the thoracic aorta. Coronary artery calcifications are noted. The esophagus is grossly normal. I do not see an obvious mass or surrounding adenopathy. Mild mid esophageal wall thickening is vastly improved when compared to the prior PET-CT. This would suggest a good response to treatment. No enlarged mediastinal or hilar lymph nodes.  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 This has enlarged since the study of 2009 and appears stable since the PET-CT.  The lungs are clear of acute process. No worrisome pulmonary nodules or masses. Emphysematous changes are again demonstrated. No pleural effusion.  CT ABDOMEN AND PELVIS FINDINGS  The solid abdominal organs are unremarkable. No findings for metastatic disease. Gallbladder is normal. No common bowel duct dilatation. No enlarged gastrohepatic ligament or the celiac axis lymph nodes. No mesenteric or retroperitoneal mass or adenopathy. Stable surgical changes involving the abdominal  aorta with a aortoiliac bypass graft. No complicating features.  The stomach, duodenum, small bowel and colon are unremarkable.  The bladder, prostate gland and seminal vesicles are unremarkable. No pelvic mass, adenopathy or free pelvic fluid collections. No inguinal mass or adenopathy.  The bony structures are unremarkable.  IMPRESSION: 1. Much improved appearance of the esophagus since the prior PET-CT suggesting a good response to therapy. Mild residual mid esophageal wall thickening is noted. No surrounding adenopathy. 2. Distal thoracic abdominal aortic aneurysm measuring 5.2 x 4.8 cm. This appears to be due to chronic dissection or IMH. 3. No findings for metastatic disease or adenopathy in the abdomen/pelvis. 4. Surgical changes related to aortoiliac bypass graft surgery. No complicating features.   Electronically Signed   By: Kalman Jewels M.D.   On: 11/10/2013 16:06    Medications: I have reviewed the patient's current medications.  Assessment/Plan: 1. 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  EGD by Dr. Collene Mares on 08/26/2013 with findings of a malignant appearing friable mass causing stenosis at 33 cm from the incisors extending to the GE junction. Stomach and the proximal small bowel were not visualized.  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  Staging PET scan on 09/03/2013 showed a 6.5 cm segment of intense hypermetabolic activity associated with the mid to distal esophagus with SUV max 24.5. There was no evidence of mediastinal metastasis, gastrohepatic ligament nodal metastasis or liver metastasis.  Initiation of radiation 09/14/2013 and concurrent weekly Taxol/carboplatin on 09/15/2013. Cycle 5 Taxol/carboplatin 10/12/2013. Radiation completed 10/26/2013  Restaging CTs 11/10/2013 with no evidence  of metastatic disease and improvement in the esophagus wall thickening 2. Dysphagia secondary to #1.  Improved. 3. Weight loss secondary to #1.  4. Peripheral vascular disease. 5. History of mesenteric ischemia. 6. odynophagia secondary to radiation. Improved.  Disposition:  Mr. Howat has completed the course of chemotherapy/radiation. A restaging CT evaluation shows no evidence of distant metastatic disease. He is undergoing a preoperative evaluation by doctors Oneida Alar and [place]. I encouraged him to increase his diet in an attempt to gain weight. He will return for an office visit in one month.  Ladell Pier, MD  11/11/2013  10:02 AM

## 2013-11-17 ENCOUNTER — Ambulatory Visit (INDEPENDENT_AMBULATORY_CARE_PROVIDER_SITE_OTHER): Payer: 59 | Admitting: Cardiothoracic Surgery

## 2013-11-17 ENCOUNTER — Encounter: Payer: Self-pay | Admitting: Cardiothoracic Surgery

## 2013-11-17 VITALS — BP 135/84 | HR 82 | Resp 20 | Ht 71.0 in | Wt 119.0 lb

## 2013-11-17 DIAGNOSIS — C159 Malignant neoplasm of esophagus, unspecified: Secondary | ICD-10-CM

## 2013-11-17 DIAGNOSIS — I716 Thoracoabdominal aortic aneurysm, without rupture, unspecified: Secondary | ICD-10-CM | POA: Insufficient documentation

## 2013-11-17 NOTE — Progress Notes (Signed)
[facility] Record #[id number] Date of Birth: 1955/07/04  Referring: Marye Round, MD Primary Care: No PCP Per Patient  Chief Complaint:    Chief Complaint  Patient presents with  . Esophageal Cancer    3 week f/u with Chest/ABD CT    History of Present Illness:    Brandon [name] 58 y.o. 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 The patient notes that he has had difficulty swallowing for approximately 6 months. He has lost a weight 140 to 114 over 3 months. He denies any melena or hematochezia. The patient was referred to GI medicine and proceeded to undergo an upper endoscopy. A malignant appearing friable mass resulting in stenosis was noted at 33 centimeters from the incisors. This extended to the GE junction. The stomach and the proximal small bowel were not visualized. The esophageal stricture extended from 33-40 cm. Biopsy was obtained and this has returned positive for invasive squamous cell carcinoma, moderately differentiated. Esophageal ultrasound could not be done. The patient has a long-term history of smoking and continues to smoke. He has reduced his alcohol intake to a 12 pack per week over the past several months. Prior to this he notes he was drinking a 12 pack of beer a day. He's able to take liquids and some but no solid food.  Initiation of radiation 09/14/2013 and concurrent weekly Taxol/carboplatin on 09/15/2013. Cycle 5 Taxol/carboplatin 10/12/2013. Radiation completed 10/26/2013  The patient notes that his by mouth intake is continuing to improve, with almost complete resolution of his pain with eating, he continues to gain weight slowly weight today is 121 pounds   The patient's history is complicated by known peripheral vascular disease and mesenteric ischemia. In 2009 he underwent aortobifemoral bypass and bypass to the superior mesenteric artery. 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 The patient denies any mesenteric ischemic symptoms currently.  He had a followup CT scan of the chest abdomen and pelvis done last week, not noted on the PET scan report but obviously present and has enlarged since 2009 is a thoracoabdominal component of aneurysmal disease above the previous abdominal graft., See CT scan report     Wt Readings from Last 3 Encounters:  11/17/13 119 lb (53.978 kg)  11/11/13 119 lb (53.978 kg)  10/23/13 125 lb 12.8 oz (57.063 kg)    Current Activity/ Functional Status:  Patient is independent with mobility/ambulation, transfers, ADL's, IADL's.   Zubrod Score: At the time of surgery this patient's most appropriate activity status/level should be described as: [x]     0    Normal activity, no symptoms []     1    Restricted in physical strenuous activity but ambulatory, able to do out light work []     2    Ambulatory and capable of self care, unable to do work activities, up and about               >50 % of waking hours                              []     3  Only limited self care, in bed greater than 50% of waking hours []     4    Completely disabled, no self care, confined to bed or chair []     5    Moribund   Past Medical History  Diagnosis Date      . Cancer 08/26/13    esophageal   . ETOH abuse     Past Surgical History  Procedure Laterality Date  .         DATE OF PROCEDURE: 07/29/2007  OPERATIVE REPORT  PROCEDURE:  1. Aortobifemoral bypass.  2. Aorta to the superior mesenteric artery bypass.  PREOPERATIVE DIAGNOSES:  1. Claudication with early rest pain, right foot.  2. Chronic mesenteric ischemia.  POSTOPERATIVE DIAGNOSES:  1. Claudication with early rest pain, right foot.  2. Chronic mesenteric ischemia. BYPASS GRAFT EVALUATION  INDICATION: Followup, aortobifem and aorta-to-SMA bypass graft.    HISTORY:  Diabetes: No.  Cardiac: No.  Hypertension: Yes.  Smoking: Yes.  Previous Surgery: Aortobifem and aorta-to-SMA bypass grafts on  07/29/07.  SINGLE LEVEL ARTERIAL EXAM  RIGHT LEFT  Brachial: 168 172  Anterior tibial: 164 148  Posterior tibial: 148 150  Peroneal:  Ankle/brachial index: 0.95 0.87  PREVIOUS ABI: Date: 08/20/07 RIGHT: 0.92 LEFT: 0.87  LOWER EXTREMITY BYPASS GRAFT DUPLEX EXAM:  DUPLEX:  1. Biphasic to monophasic waveforms noted throughout the aortobifem  bypass grafts and their native vessels with no evidence of  increased velocities noted.  2. Patent aorta-to-SMA bypass graft with no increase in velocities  noted.  3. Elevated velocity of 231 cm/s noted in the proximal celiac artery.  IMPRESSION:  1. Patent aortobifemoral and aorta-to-superior mesenteric artery  bypass grafts with no evidence of stenosis noted.  2. 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  3. Stable bilateral ankle brachial indices noted.  4. No significant change noted since the previous examinations on  08/13/07 and 08/20/07.  ___________________________________________  Jessy Oto. Fields, MD  CH/MEDQ D: 01/28/2008 T: 01/28/2008 Job: [id number]    Family History  Problem Relation Age of Onset  . Stroke Mother     History   Social History  . Marital Status: Divorced    Spouse Name: N/A    Number of Children: N/A  . Years of Education: N/A   Occupational History  . Not on file.   Social History Main Topics  . Smoking status: Current Every Day Smoker -- 1.00 packs/day for 40 years    Types: Cigarettes    Start date: 08/31/2013  . Smokeless tobacco: Not on file     Comment: down to 1 cigarette daily  . Alcohol Use: Yes     Comment: 12 beer weekly  since last 3 months  . Drug Use: No  . Sexual Activity: Not on file   Other Topics Concern  . Not on file   Social History Narrative  . No narrative on file    History   Smoking status  . Current Every Day Smoker -- 1.00 packs/day for 40 years  . Types: Cigarettes  . Start date: 08/31/2013  Smokeless tobacco  . Not on file    Comment: down to 1 cigarette daily    History  Alcohol Use  . Yes    Comment: 12 beer weekly  since last 3 months     No Known Allergies  Current Outpatient Prescriptions  Medication Sig Dispense Refill  . aspirin EC 81 MG tablet Take 81 mg  by mouth daily.      Marland Kitchen emollient (BIAFINE) cream Apply 1 application topically daily. Apply to skin area after rad tx daily and prn      . HYDROcodone-acetaminophen (HYCET) 7.5-325 mg/15 ml solution Take 10-15 mLs by mouth 4 (four) times daily as needed for moderate pain.  500 mL  0  . ibuprofen (ADVIL,MOTRIN) 200 MG tablet Take 200 mg by mouth every 6 (six) hours as needed.      . lactose free nutrition (BOOST) LIQD Take 237 mLs by mouth 2 (two) times daily between meals.      . prochlorperazine (COMPAZINE) 10 MG tablet Take 1 tablet (10 mg total) by mouth every 6 (six) hours as needed.  60 tablet  1  . sucralfate (CARAFATE) 1 G tablet Take 1 tablet (1 g total) by mouth 4 (four) times daily.  120 tablet  1   No current facility-administered medications for this visit.     Review of Systems:     Cardiac Review of Systems: Y or N  Chest Pain [   n ]  Resting SOB [n] Exertional SOB  Blue.Reese  ]  Orthopnea Florencio.Farrier  ]   Pedal Edema Florencio.Farrier   ]    Palpitations Florencio.Farrier  ] Syncope  Florencio.Farrier  ]   Presyncope Florencio.Farrier   ]  General Review of Systems: [Y] = yes [  ]=no Constitional: recent weight change Minnie.Brome  ];  Wt loss over the last 3 months Aura.Spears   ] anorexia [  ]; fatigue [ y ]; nausea [  ]; night sweats [  ]; fever [  ]; or chills [  ];          Dental: poor dentition[ y]; Last Dentist visit:   Eye : blurred vision [  ]; diplopia [   ]; vision changes [  ];  Amaurosis fugax[  ]; Resp: cough [  ];  wheezing[  ];  hemoptysis[  ]; shortness of breath[  ]; paroxysmal nocturnal dyspnea[  ]; dyspnea on exertion[  ]; or orthopnea[  ];   GI:  gallstones[  ], vomiting[n  ];  dysphagia[ y ]; melena[  ];  hematochezia [  ]; heartburn[  ];   Hx of  Colonoscopy[ n ]; GU: kidney stones [  ]; hematuria[  ];   dysuria [  ];  nocturia[  ];  history of     obstruction [  ]; urinary frequency [  ]             Skin: rash, swelling[  ];, hair loss[  ];  peripheral edema[  ];  or itching[  ]; Musculosketetal: myalgias[  ];  joint swelling[  ];  joint erythema[  ];  joint pain[  ];  back pain[  ];  Heme/Lymph: bruising[  ];  bleeding[  ];  anemia[  ];  Neuro: TIA[  ];  headaches[  ];  stroke[  ];  vertigo[  ];  seizures[  ];   paresthesias[  ];  difficulty walking[ n ];  Psych:depression[  ]; anxiety[  ];  Endocrine: diabetes[ n ];  thyroid dysfunction[  ];  Immunizations: Flu up to date [ n ]; Pneumococcal up to date Florencio.Farrier  ];  Other:  Physical Exam: BP 135/84  Pulse 82  Resp 20  Ht 5\' 11"  (1.803 m)  Wt 119 lb (53.978 kg)  BMI 16.60 kg/m2  SpO2 98%  PHYSICAL EXAMINATION:  General appearance: alert, cooperative, appears older than  stated age, cachectic and no distress Neurologic: intact Heart: regular rate and rhythm, S1, S2 normal, no murmur, click, rub or gallop Lungs: clear to auscultation bilaterally and normal percussion bilaterally Abdomen: soft, non-tender; bowel sounds normal; no masses,  no organomegaly Extremities: extremities normal, atraumatic, no cyanosis or edema and Homans sign is negative, no sign of DVT Do not appreciate cervical or supraclavicular adenopathy no axillary adenopathy, no carotid bruits No abdominal bruit Patient has palpable DP 2+ bilateral and PT 1+ bilateral pulses  Diagnostic Studies & Laboratory data:     Recent Radiology Findings:  Ct Chest W Contrast/Ct Abdomen Pelvis W Contrast  11/10/2013   CLINICAL DATA:  Esophageal cancer. Status post chemotherapy and radiation therapy.  EXAM: CT CHEST, ABDOMEN, AND PELVIS WITH CONTRAST  TECHNIQUE: Multidetector CT imaging of the chest, abdomen and pelvis  was performed following the standard protocol during bolus administration of intravenous contrast.  CONTRAST:  169mL OMNIPAQUE IOHEXOL 300 MG/ML  SOLN  COMPARISON:  Head CT 09/03/2013.  FINDINGS: CT CHEST FINDINGS  The chest wall is unremarkable and stable. No supraclavicular or axillary mass or adenopathy. The thyroid gland is normal. The bony thorax is intact. No destructive bone lesions or spinal canal compromise.  The heart is normal in size. No pericardial effusion. No mediastinal or hilar mass or adenopathy. There is tortuosity and moderate atherosclerotic calcifications involving the thoracic aorta. Coronary artery calcifications are noted. The esophagus is grossly normal. I do not see an obvious mass or surrounding adenopathy. Mild mid esophageal wall thickening is vastly improved when compared to the prior PET-CT. This would suggest a good response to treatment. No enlarged mediastinal or hilar lymph nodes.  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 This has enlarged since the study of 2009 and appears stable since the PET-CT.  The lungs are clear of acute process. No worrisome pulmonary nodules or masses. Emphysematous changes are again demonstrated. No pleural effusion.  CT ABDOMEN AND PELVIS FINDINGS  The solid abdominal organs are unremarkable. No findings for metastatic disease. Gallbladder is normal. No common bowel duct dilatation. No enlarged gastrohepatic ligament or the celiac axis lymph nodes. No mesenteric or retroperitoneal mass or adenopathy. Stable surgical changes involving the abdominal aorta with a aortoiliac bypass graft. No complicating features.  The stomach, duodenum, small bowel and colon are unremarkable.  The bladder, prostate gland and seminal vesicles are unremarkable. No pelvic mass, adenopathy or free pelvic fluid collections. No inguinal mass or adenopathy.  The bony structures are unremarkable.   IMPRESSION: 1. Much improved appearance of the esophagus since the prior PET-CT suggesting a good response to therapy. Mild residual mid esophageal wall thickening is noted. No surrounding adenopathy. 2. Distal thoracic abdominal aortic aneurysm measuring 5.2 x 4.8 cm. This appears to be due to chronic dissection or IMH. 3. No findings for metastatic disease or adenopathy in the abdomen/pelvis. 4. Surgical changes related to aortoiliac bypass graft surgery. No complicating features.   Electronically Signed   By: Kalman Jewels M.D.   On: 11/10/2013 16:06    09/03/2013   CLINICAL DATA:  Initial treatment strategy for esophageal mass.  EXAM: NUCLEAR MEDICINE PET SKULL BASE TO THIGH  TECHNIQUE: 5.7 mCi F-18 FDG was injected intravenously. Full-ring PET imaging was performed from the skull base to thigh after the radiotracer. CT data was obtained and used for attenuation correction and anatomic localization.  FASTING BLOOD GLUCOSE:  Value: 100 mg/dl  COMPARISON:  None.  FINDINGS: NECK  No hypermetabolic lymph nodes in the neck.  CHEST  There is a 6.5 cm segment of intense hypermetabolic activity associated with the mid to distal esophagus with SUV max equals 24.5. The activity initiates just above the carina and terminates several cm from the GE junction. No hypermetabolic mediastinal lymph nodes.  No suspicious or metabolic pulmonary nodules.  There is a precarinal lymph node measuring 13 mm (image 73) without associated metabolic activity  ABDOMEN/PELVIS  No abnormal hypermetabolic activity within the liver, pancreas, adrenal glands, or spleen. No hypermetabolic lymph nodes in the abdomen or pelvis.  SKELETON  No hypermetabolic gastrohepatic ligament lymph nodes. No abnormal metabolic activity within the liver. No hypermetabolic abdominal pelvic nodes.  As on note of a aortic graft repair.  IMPRESSION: 1. Long segment of intense hypermetabolic activity within the mid to distal esophagus consistent with esophageal  carcinoma. 2. No evidence of mediastinal metastasis, gastrohepatic ligament nodal metastasis or liver metastasis. 3. No distant metastasis.   Electronically Signed   By: Suzy Bouchard M.D.   On: 09/03/2013 09:45    Recent Lab Findings: Lab Results  Component Value Date   WBC 2.7* 10/19/2013   HGB 10.3* 10/19/2013   HCT 31.3* 10/19/2013   PLT 144 10/19/2013   GLUCOSE 113 10/19/2013   CHOL  Value: 105        ATP III CLASSIFICATION:  <200     mg/dL   Desirable  200-239  mg/dL   Borderline High  >=240    mg/dL   High 07/28/2007   TRIG 59 08/04/2007   HDL 32* 07/27/2007   LDLCALC  Value: 91        Total Cholesterol/HDL:CHD Risk Coronary Heart Disease Risk Table                     Men   Women  1/2 Average Risk   3.4   3.3 07/19/2007   ALT 13 10/19/2013   AST 16 10/19/2013   NA 141 10/19/2013   K 4.0 10/19/2013   CL 103 11/26/2011   CREATININE 0.8 10/19/2013   BUN 9.7 10/19/2013   CO2 25 10/19/2013   INR 1.1 07/29/2007   Surgical past from [facility] on [address] DS [phone] dated 08/28/2013 Reported as esophagus lower third invasive squamous cell carcinoma moderately differentiated  Assessment / Plan:  Locally advanced squamous cell carcinoma of the mid and distal third of the esophagus History of aortobifem with SMA bypass for chronic mesenteric ischemia Distal thoracic abdominal aortic aneurysm measuring 5.2 x 4.8 cm. enlarged since 2009 Long-term alcohol and tobacco abuse-still smoking but decrease. Will plan to see back in 2 weeks to  consider surgical resection after seen by Dr Oneida Alar and consideration of arterygram Dr Oneida Alar to concerning formal evaluation of mesenteric vasculature and aortic aneurysm, will need to ensure adequate gastric perfusion before resection. With mid esophageal lesion will need rt thoracotomy for resection.  Grace Isaac MD [address] [place] [address] Office [phone]   Beeper [phone]    11/17/2013 11:58 AM

## 2013-11-18 ENCOUNTER — Encounter: Payer: Self-pay | Admitting: Vascular Surgery

## 2013-11-19 ENCOUNTER — Ambulatory Visit: Payer: 59 | Admitting: Cardiothoracic Surgery

## 2013-11-19 ENCOUNTER — Ambulatory Visit (INDEPENDENT_AMBULATORY_CARE_PROVIDER_SITE_OTHER): Payer: 59 | Admitting: Vascular Surgery

## 2013-11-19 ENCOUNTER — Encounter: Payer: Self-pay | Admitting: Vascular Surgery

## 2013-11-19 ENCOUNTER — Encounter: Payer: Self-pay | Admitting: *Deleted

## 2013-11-19 ENCOUNTER — Other Ambulatory Visit: Payer: Self-pay | Admitting: *Deleted

## 2013-11-19 VITALS — BP 151/90 | HR 103 | Resp 16

## 2013-11-19 DIAGNOSIS — I714 Abdominal aortic aneurysm, without rupture, unspecified: Secondary | ICD-10-CM

## 2013-11-19 DIAGNOSIS — K551 Chronic vascular disorders of intestine: Secondary | ICD-10-CM | POA: Insufficient documentation

## 2013-11-19 DIAGNOSIS — K55059 Acute (reversible) ischemia of intestine, part and extent unspecified: Secondary | ICD-10-CM

## 2013-11-19 NOTE — Progress Notes (Addendum)
Established Patient Progress Note  History of Present Illness  Brandon [name] is a 58 y.o. 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 His is status post aorto-bifemoral bypass and superior mesenteric artery bypass surgery by Dr. Oneida Alar in 2009. He was diagnosed with esophageal cancer 4 months ago and has completed chemotherapy and radiation. He was sent here by his cardiothoracic surgeon, Dr. Servando Snare for  evaluation of his mesenteric vasculature and thoracic aneurysm. Dr. Servando Snare is planning to perform esophageal resection but needs to know there is adequate perfusion to prior to surgery. The patient notes epigastric pain "around the esophagus" intermittently with eating over the past month. The pain is not associated with every eating event. He denies any fear with food consumption.  The patient describes his weight as fluctuating, "going up and down."  On ROS today: he denies any chest pain, shortness of breath, abdominal pain, melena, hematochezia, intermittent claudication, and rest pain. All other systems are negative.  He has currently takes a daily 81mg  aspirin. He is not taking any blood thinners. He smokes 1/2 pack of cigarettes per day.   His thoracic abdominal aortic aneurysm has enlarged to 5.2 x 4.8 cm since his last CT scan in 2009.   Past Medical History  Diagnosis Date  . Cancer 08/26/13    esophageal path pending  . ETOH abuse   . Tobacco abuse   . 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 History of aorta-iliac-femoral bypass     Past Surgical History  Procedure Laterality Date  . Cardiac surgery      femoral bypass surgery   aortobifemoral bypass with SMA bypass  History   Social History  . Marital Status: Divorced    Spouse Name: N/A    Number of Children: N/A    . Years of Education: N/A   Occupational History  . Not on file.   Social History Main Topics  . Smoking status: Current Every Day Smoker -- 1.00 packs/day for 40 years    Types: Cigarettes    Start date: 08/31/2013  . Smokeless tobacco: Never Used     Comment: down to 1 cigarette daily  . Alcohol Use: Yes     Comment: 12 beer weekly  since last 3 months  . Drug Use: No  . Sexual Activity: Not on file   Other Topics Concern  . Not on file   Social History Narrative  . No narrative on file    Review of systems: The patient denies significant weight loss and says his weight has been going up and down. He is shortness of breath with exertion. He denies chest pain.  Family History  Problem Relation Age of Onset  . Stroke Mother         Physical Examination  Filed Vitals:   11/19/13 1327  BP: 151/90  Pulse: 103  Resp: 16   There is no weight on file to calculate BMI.  General: A&O x 3, Thin male in NAD    Pulmonary: Sym exp, good air movt, CTAB, no rales, rhonchi, & wheezing  Cardiac: RRR, Nl S1, S2, no Murmurs, rubs or gallops, No carotid bruits   Vascular: Vessel Right Left  Carotid without bruit  without bruit  Aorta Not palpable N/A  Femoral Palpable Palpable  Popliteal Faintly palpable Faintly palpable  PT Palpable Palpable  DP Palpable Palpable   Gastrointestinal: soft, non-tender to palpation, no masses, +bowel sounds, healed midline laparotomy scar.   Musculoskeletal: M/S 5/5 throughout. Extremities without ischemic changes.   Neurologic: Pain and light touch intact in extremities.  Motor exam as listed above   Medical Decision Making  Brandon [name] is a 58 y.o. male who presents with: distal thoracic aneurysm, esophageal squamous carcinoma and chronic mesenteric arterial insufficiency.    Based on the exam and studies, we will proceed with a mesenteric arteriogram with Dr. Trula Slade on 11/25/13. He does not have any current symptoms of  mesenteric ischemia.    The patient is currently on aspirin. He is not on any blood thinners.   Thoracic aneurysm has increased in size but is below the threshold for surgical repair. He is asymptomatic. Will continue to monitor.   Discussed in depth with the patient the nature of atherosclerosis, and emphasized the importance of maximal medical management including strict control of blood pressure, blood glucose, and lipid levels, obtaining regular exercise, and cessation of smoking.    The patient is aware that without maximal medical management the underlying atherosclerotic disease process will progress, limiting the benefit of any interventions.  Thank you for allowing [place] to participate in this patient's care.  Virgina Jock, PA-C [facility] Office: [phone] Pager: [phone]  11/19/2013, 2:04 PM   History and exam details as above. I reviewed the CT scan today which shows a patent aortobifemoral bypass the SMA bypass is not well opacified neither is the native celiac artery. This was not a dedicated CTA of the abdomen and pelvis. The patient with previous aortobifemoral bypass and SMA bypass several years ago. He is doing well from this overall. He has a history of celiac artery stenosis which has been asymptomatic but since his esophageal reconstruction will be based off of this I believe we need to further evaluate his mesenteric circulation to make sure that it is adequate for a gastric conduit. He also has a thoracoabdominal aneurysm primarily of the descending thoracic aorta which is less than 6 cm in diameter. We will continue to follow this for now unless he requires mesenteric revascularization.  Since I am out of town next week I scheduled a mesenteric angiogram by my partner Dr. Trula Slade. I discussed with the patient today risks benefits possible complications and procedure details of mesenteric angiogram including but not limited to bleeding  infection vessel injury. He understands and agrees to proceed. This is scheduled for June 17. If the patient has significant celiac artery stenosis which is not amenable to percutaneous treatment we will need to consider whether or not to proceed with aneurysm repair as well as mesenteric revascularization to improve flow to the stomach. However also a mesenteric angiogram will be a with to determine whether or not flow to his celiac system from the SMA bypass may be adequate.  Ruta Hinds, MD [facility] Office: [phone] Pager: [phone]

## 2013-11-25 ENCOUNTER — Ambulatory Visit (HOSPITAL_COMMUNITY)
Admission: RE | Admit: 2013-11-25 | Discharge: 2013-11-25 | Disposition: A | Discharge status: Home / Self Care | Payer: 59 | Source: Ambulatory Visit | Attending: Surgery | Admitting: Surgery

## 2013-11-25 ENCOUNTER — Encounter (HOSPITAL_COMMUNITY)
Admission: RE | Disposition: A | Discharge status: Home / Self Care | Payer: Self-pay | Source: Ambulatory Visit | Attending: Surgery

## 2013-11-25 DIAGNOSIS — F1010 Alcohol abuse, uncomplicated: Secondary | ICD-10-CM | POA: Insufficient documentation

## 2013-11-25 DIAGNOSIS — Z9221 Personal history of antineoplastic chemotherapy: Secondary | ICD-10-CM | POA: Insufficient documentation

## 2013-11-25 DIAGNOSIS — K551 Chronic vascular disorders of intestine: Secondary | ICD-10-CM

## 2013-11-25 DIAGNOSIS — Z923 Personal history of irradiation: Secondary | ICD-10-CM | POA: Insufficient documentation

## 2013-11-25 DIAGNOSIS — I712 Thoracic aortic aneurysm, without rupture, unspecified: Secondary | ICD-10-CM | POA: Insufficient documentation

## 2013-11-25 DIAGNOSIS — F17200 Nicotine dependence, unspecified, uncomplicated: Secondary | ICD-10-CM | POA: Insufficient documentation

## 2013-11-25 DIAGNOSIS — I774 Celiac artery compression syndrome: Secondary | ICD-10-CM | POA: Insufficient documentation

## 2013-11-25 DIAGNOSIS — C155 Malignant neoplasm of lower third of esophagus: Secondary | ICD-10-CM | POA: Insufficient documentation

## 2013-11-25 HISTORY — PX: VISCERAL ANGIOGRAM: SHX5515

## 2013-11-25 LAB — POCT I-STAT, CHEM 8
BUN: <3 mg/dL — ABNORMAL LOW (ref 6–23)
CALCIUM ION: 1.3 mmol/L — AB (ref 1.12–1.23)
Chloride: 103 mEq/L (ref 96–112)
Creatinine, Ser: 0.9 mg/dL (ref 0.50–1.35)
GLUCOSE: 80 mg/dL (ref 70–99)
HEMATOCRIT: 40 % (ref 39.0–52.0)
HEMOGLOBIN: 13.6 g/dL (ref 13.0–17.0)
Potassium: 3.9 mEq/L (ref 3.7–5.3)
Sodium: 143 mEq/L (ref 137–147)
TCO2: 27 mmol/L (ref 0–100)

## 2013-11-25 SURGERY — VISCERAL ANGIOGRAM
Anesthesia: LOCAL

## 2013-11-25 MED ORDER — SODIUM CHLORIDE 0.9 % IV SOLN
INTRAVENOUS | Status: DC
  Administered 2013-11-25: 08:00:00 via INTRAVENOUS

## 2013-11-25 MED ORDER — ACETAMINOPHEN 325 MG RE SUPP: 325.0000 mg | RECTAL | Status: DC | PRN

## 2013-11-25 MED ORDER — MORPHINE SULFATE 10 MG/ML IJ SOLN: 2.0000 mg | INTRAMUSCULAR | Status: DC | PRN

## 2013-11-25 MED ORDER — MIDAZOLAM HCL 2 MG/2ML IJ SOLN
INTRAMUSCULAR | Status: AC
  Filled 2013-11-25: qty 2

## 2013-11-25 MED ORDER — GUAIFENESIN-DM 100-10 MG/5ML PO SYRP: 15.0000 mL | ORAL_SOLUTION | ORAL | Status: DC | PRN

## 2013-11-25 MED ORDER — FENTANYL CITRATE 0.05 MG/ML IJ SOLN
INTRAMUSCULAR | Status: AC
  Filled 2013-11-25: qty 2

## 2013-11-25 MED ORDER — ACETAMINOPHEN 325 MG PO TABS: 325.0000 mg | ORAL_TABLET | ORAL | Status: DC | PRN

## 2013-11-25 MED ORDER — ONDANSETRON HCL 4 MG/2ML IJ SOLN: 4.0000 mg | Freq: Four times a day (QID) | INTRAMUSCULAR | Status: DC | PRN

## 2013-11-25 MED ORDER — HEPARIN (PORCINE) IN NACL 2-0.9 UNIT/ML-% IJ SOLN
INTRAMUSCULAR | Status: AC
  Filled 2013-11-25: qty 1000

## 2013-11-25 MED ORDER — HYDRALAZINE HCL 20 MG/ML IJ SOLN: 10.0000 mg | INTRAMUSCULAR | Status: DC | PRN

## 2013-11-25 MED ORDER — METOPROLOL TARTRATE 1 MG/ML IV SOLN: 2.0000 mg | INTRAVENOUS | Status: DC | PRN

## 2013-11-25 MED ORDER — PHENOL 1.4 % MT LIQD: 1.0000 | OROMUCOSAL | Status: DC | PRN

## 2013-11-25 MED ORDER — ALUM & MAG HYDROXIDE-SIMETH 200-200-20 MG/5ML PO SUSP: 15.0000 mL | ORAL | Status: DC | PRN

## 2013-11-25 MED ORDER — SODIUM CHLORIDE 0.9 % IV SOLN: 1.0000 mL/kg/h | INTRAVENOUS | Status: DC

## 2013-11-25 MED ORDER — LIDOCAINE HCL (PF) 1 % IJ SOLN
INTRAMUSCULAR | Status: AC
  Filled 2013-11-25: qty 30

## 2013-11-25 MED ORDER — LABETALOL HCL 5 MG/ML IV SOLN: 10.0000 mg | INTRAVENOUS | Status: DC | PRN

## 2013-11-25 MED ORDER — OXYCODONE-ACETAMINOPHEN 5-325 MG PO TABS: 1.0000 | ORAL_TABLET | ORAL | Status: DC | PRN

## 2013-11-25 NOTE — H&P (View-Only) (Signed)
Established Patient Progress Note  History of Present Illness  Brandon [name] is a 58 y.o. 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 His is status post aorto-bifemoral bypass and superior mesenteric artery bypass surgery by Dr. Oneida Alar in 2009. He was diagnosed with esophageal cancer 4 months ago and has completed chemotherapy and radiation. He was sent here by his cardiothoracic surgeon, Dr. Servando Snare for  evaluation of his mesenteric vasculature and thoracic aneurysm. Dr. Servando Snare is planning to perform esophageal resection but needs to know there is adequate perfusion to prior to surgery. The patient notes epigastric pain "around the esophagus" intermittently with eating over the past month. The pain is not associated with every eating event. He denies any fear with food consumption.  The patient describes his weight as fluctuating, "going up and down."  On ROS today: he denies any chest pain, shortness of breath, abdominal pain, melena, hematochezia, intermittent claudication, and rest pain. All other systems are negative.  He has currently takes a daily 81mg  aspirin. He is not taking any blood thinners. He smokes 1/2 pack of cigarettes per day.   His thoracic abdominal aortic aneurysm has enlarged to 5.2 x 4.8 cm since his last CT scan in 2009.   Past Medical History  Diagnosis Date  . Cancer 08/26/13    esophageal path pending  . ETOH abuse   . Tobacco abuse   . 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 History of aorta-iliac-femoral bypass     Past Surgical History  Procedure Laterality Date  . Cardiac surgery      femoral bypass surgery   aortobifemoral bypass with SMA bypass  History   Social History  . Marital Status: Divorced    Spouse Name: N/A    Number of Children: N/A    . Years of Education: N/A   Occupational History  . Not on file.   Social History Main Topics  . Smoking status: Current Every Day Smoker -- 1.00 packs/day for 40 years    Types: Cigarettes    Start date: 08/31/2013  . Smokeless tobacco: Never Used     Comment: down to 1 cigarette daily  . Alcohol Use: Yes     Comment: 12 beer weekly  since last 3 months  . Drug Use: No  . Sexual Activity: Not on file   Other Topics Concern  . Not on file   Social History Narrative  . No narrative on file    Review of systems: The patient denies significant weight loss and says his weight has been going up and down. He is shortness of breath with exertion. He denies chest pain.  Family History  Problem Relation Age of Onset  . Stroke Mother         Physical Examination  Filed Vitals:   11/19/13 1327  BP: 151/90  Pulse: 103  Resp: 16   There is no weight on file to calculate BMI.  General: A&O x 3, Thin male in NAD    Pulmonary: Sym exp, good air movt, CTAB, no rales, rhonchi, & wheezing  Cardiac: RRR, Nl S1, S2, no Murmurs, rubs or gallops, No carotid bruits   Vascular: Vessel Right Left  Carotid without bruit  without bruit  Aorta Not palpable N/A  Femoral Palpable Palpable  Popliteal Faintly palpable Faintly palpable  PT Palpable Palpable  DP Palpable Palpable   Gastrointestinal: soft, non-tender to palpation, no masses, +bowel sounds, healed midline laparotomy scar.   Musculoskeletal: M/S 5/5 throughout. Extremities without ischemic changes.   Neurologic: Pain and light touch intact in extremities.  Motor exam as listed above   Medical Decision Making  Brandon [name] is a 58 y.o. male who presents with: distal thoracic aneurysm, esophageal squamous carcinoma and chronic mesenteric arterial insufficiency.    Based on the exam and studies, we will proceed with a mesenteric arteriogram with Dr. Trula Slade on 11/25/13. He does not have any current symptoms of  mesenteric ischemia.    The patient is currently on aspirin. He is not on any blood thinners.   Thoracic aneurysm has increased in size but is below the threshold for surgical repair. He is asymptomatic. Will continue to monitor.   Discussed in depth with the patient the nature of atherosclerosis, and emphasized the importance of maximal medical management including strict control of blood pressure, blood glucose, and lipid levels, obtaining regular exercise, and cessation of smoking.    The patient is aware that without maximal medical management the underlying atherosclerotic disease process will progress, limiting the benefit of any interventions.  Thank you for allowing [place] to participate in this patient's care.  Virgina Jock, PA-C [facility] [place] Office: [phone] Pager: [phone]  11/19/2013, 2:04 PM   History and exam details as above. I reviewed the CT scan today which shows a patent aortobifemoral bypass the SMA bypass is not well opacified neither is the native celiac artery. This was not a dedicated CTA of the abdomen and pelvis. The patient with previous aortobifemoral bypass and SMA bypass several years ago. He is doing well from this overall. He has a history of celiac artery stenosis which has been asymptomatic but since his esophageal reconstruction will be based off of this I believe we need to further evaluate his mesenteric circulation to make sure that it is adequate for a gastric conduit. He also has a thoracoabdominal aneurysm primarily of the descending thoracic aorta which is less than 6 cm in diameter. We will continue to follow this for now unless he requires mesenteric revascularization.  Since I am out of town next week I scheduled a mesenteric angiogram by my partner Dr. Trula Slade. I discussed with the patient today risks benefits possible complications and procedure details of mesenteric angiogram including but not limited to bleeding  infection vessel injury. He understands and agrees to proceed. This is scheduled for June 17. If the patient has significant celiac artery stenosis which is not amenable to percutaneous treatment we will need to consider whether or not to proceed with aneurysm repair as well as mesenteric revascularization to improve flow to the stomach. However also a mesenteric angiogram will be a with to determine whether or not flow to his celiac system from the SMA bypass may be adequate.  Ruta Hinds, MD [facility] Office: [phone] Pager: [phone]

## 2013-11-25 NOTE — Discharge Instructions (Signed)
Angiogram, Care After Refer to this sheet in the next few weeks. These instructions provide you with information on caring for yourself after your procedure. Your health care provider may also give you more specific instructions. Your treatment has been planned according to current medical practices, but problems sometimes occur. Call your health care provider if you have any problems or questions after your procedure.  WHAT TO EXPECT AFTER THE PROCEDURE After your procedure, it is typical to have the following sensations:  Minor discomfort or tenderness and a small bump at the catheter insertion site. The bump should usually decrease in size and tenderness within 1 to 2 weeks.  Any bruising will usually fade within 2 to 4 weeks. HOME CARE INSTRUCTIONS   You may need to keep taking blood thinners if they were prescribed for you. Only take over-the-counter or prescription medicines for pain, fever, or discomfort as directed by your health care provider.  Do not apply powder or lotion to the site.  Do not sit in a bathtub, swimming pool, or whirlpool for 5 to 7 days.  You may shower 24 hours after the procedure. Remove the bandage (dressing) and gently wash the site with plain soap and water. Gently pat the site dry.  Inspect the site at least twice daily.  Limit your activity for the first 48 hours. Do not bend, squat, or lift anything over 20 lb (9 kg) or as directed by your health care provider.  Do not drive home if you are discharged the day of the procedure. Have someone else drive you. Follow instructions about when you can drive or return to work. SEEK MEDICAL CARE IF:  You get lightheaded when standing up.  You have drainage (other than a small amount of blood on the dressing).  You have chills.  You have a fever.  You have redness, warmth, swelling, or pain at the insertion site. SEEK IMMEDIATE MEDICAL CARE IF:   You develop chest pain or shortness of breath, feel faint,  or pass out.  You have bleeding, swelling larger than a walnut, or drainage from the catheter insertion site.  You develop pain, discoloration, coldness, or severe bruising in the leg or arm that held the catheter.  You have heavy bleeding from the site. If this happens, hold pressure on the site and call 911. MAKE SURE YOU:  Understand these instructions.  Will watch your condition.  Will get help right away if you are not doing well or get worse. Document Released: 12/14/2004 Document Revised: 06/02/2013 Document Reviewed: 10/20/2012 [facility] Patient Information 2015 [place], [place]. This information is not intended to replace advice given to you by your health care provider. Make sure you discuss any questions you have with your health care provider.              Return To Work ___[name]__ was treated at our facility. INJURY OR ILLNESS WAS: _____ Work-related __X___ Not work-related _____ Undetermined if work-related RETURN TO WORK  Employee may return to work on:    Monday June 22, 2015__  Employee may return to modified work on: ____________________ [place] Work activities not tolerated include: _____ Bending _____ Prolonged sitting _____ Lifting _____ Squatting _____ Prolonged standing _____ Brandon [name] _____ Reaching _____ Pushing and pulling _____ Walking _____ Other ____________________ Show this Return to Work statement to Optician, dispensing at work as soon as possible. Your employer should be aware of your condition and can help with the necessary work activity restrictions. If you  wish to return to work sooner than the date above, or if you have further problems which make it difficult for you to return at that time, please call us or your caregiver. Dr. Durene Fruits Brabham______ Physician Name (Printed) _________________________________________ Physician Signature  __06/17/2015_ Date Document Released: 05/28/2005 Document Revised:  08/20/2011 Document Reviewed: 11/12/2006 [facility] Patient Information 2015 [place], [place]. This information is not intended to replace advice given to you by your health care provider. Make sure you discuss any questions you have with your health care provider.

## 2013-11-25 NOTE — Interval H&P Note (Signed)
History and Physical Interval Note:  11/25/2013 9:07 AM  Brandon [name]  has presented today for surgery, with the diagnosis of mesenteric stenosis  The various methods of treatment have been discussed with the patient and family. After consideration of risks, benefits and other options for treatment, the patient has consented to  Procedure(s): VISCERAL ANGIOGRAM (N/A) as a surgical intervention .  The patient's history has been reviewed, patient examined, no change in status, stable for surgery.  I have reviewed the patient's chart and labs.  Questions were answered to the patient's satisfaction.     BRABHAM IV, V. WELLS

## 2013-11-25 NOTE — CV Procedure (Signed)
    Patient name: NYLAN NEVEL MRN: [id number] DOB: [birth year] Sex: male  11/25/2013 Pre-operative Diagnosis: Mesenteric stenosis Post-operative diagnosis:  Same Surgeon:  Eldridge Abrahams Procedure Performed:  1.  ultrasound-guided access, right femoral artery  2.  abdominal aortogram  3.  first order catheterization (superior mesenteric artery)  4.  mesenteric angiography    Indications:  The patient has a descending thoracic aortic aneurysm, esophageal cancer, and is status post aortobifemoral bypass graft with SMA bypass.  There is concern over mesenteric/celiac stenosis which could impact his esophageal cancer procedure.  He comes in for further evaluation.  Procedure:  The patient was identified in the holding area and taken to room 8.  The patient was then placed supine on the table and prepped and draped in the usual sterile fashion.  A time out was called.  Ultrasound was used to evaluate the right common femoral artery.  It was patent .  A digital ultrasound image was acquired.  A micropuncture needle was used to access the right common femoral artery under ultrasound guidance.  An 018 wire was advanced without resistance and a micropuncture sheath was placed.  The 018 wire was removed and a benson wire was placed.  The micropuncture sheath was exchanged for a 5 french sheath.  An omniflush catheter was advanced over the wire to the level of T-12.  An abdominal angiogram was obtained.  Next, I used multiple reverse curve catheters to try to selected the celiac artery.  These were all unsuccessful.  I was able to and performed selective imaging.  This did appear to be patent with proximal narrowing.  A non-selective injection was also performed within the aortobifemoral bypass graft which better defined the visceral anatomy. Findings:   Aortogram:  No definitive origin to the celiac artery was identified, this was difficult to image, however I suspect that the origin of the superior  mesenteric artery is occluded.  The superior mesenteric artery did appear to be patent with a high-grade proximal stenosis.  The aortobifemoral bypass graft is widely patent.  Renal arteries are widely patent bilaterally.  The superior mesenteric artery bypass graft originates from the tube portion of the aortobifemoral bypass graft.  There was good retrograde feeling of the celiac axis and its major branches.   Intervention:  None  Impression:  #1   difficult to fully evaluate the ostium of the celiac artery.  A CT angiogram will be performed for better identification, however I suspect that the celiac artery is occluded at its origin and fills retrograde from the territory of the superior mesenteric artery  #2  widely patent aortobifemoral bypass graft    V. Annamarie Major, M.D. [facility] [place] Office: [phone] Pager:  [phone]

## 2013-12-03 ENCOUNTER — Encounter: Payer: Self-pay | Admitting: Cardiothoracic Surgery

## 2013-12-03 ENCOUNTER — Ambulatory Visit (INDEPENDENT_AMBULATORY_CARE_PROVIDER_SITE_OTHER): Payer: 59 | Admitting: Cardiothoracic Surgery

## 2013-12-03 ENCOUNTER — Other Ambulatory Visit: Payer: Self-pay | Admitting: *Deleted

## 2013-12-03 ENCOUNTER — Encounter: Payer: Self-pay | Admitting: Radiation Oncology

## 2013-12-03 VITALS — BP 144/83 | HR 89 | Resp 16 | Ht 71.0 in | Wt 119.0 lb

## 2013-12-03 DIAGNOSIS — Z95828 Presence of other vascular implants and grafts: Secondary | ICD-10-CM

## 2013-12-03 DIAGNOSIS — Z01810 Encounter for preprocedural cardiovascular examination: Secondary | ICD-10-CM

## 2013-12-03 DIAGNOSIS — K551 Chronic vascular disorders of intestine: Secondary | ICD-10-CM

## 2013-12-03 DIAGNOSIS — C155 Malignant neoplasm of lower third of esophagus: Secondary | ICD-10-CM

## 2013-12-03 DIAGNOSIS — I774 Celiac artery compression syndrome: Secondary | ICD-10-CM

## 2013-12-03 DIAGNOSIS — Z9889 Other specified postprocedural states: Secondary | ICD-10-CM

## 2013-12-03 DIAGNOSIS — I771 Stricture of artery: Secondary | ICD-10-CM

## 2013-12-03 NOTE — Progress Notes (Signed)
[place] Record #[id number] Date of Birth: 01/01/1956  Referring: Marye Round, MD, & Dr Benay Spice Primary Care: No PCP Per Patient  Chief Complaint:    Chief Complaint  Patient presents with  . Esophageal Cancer    2 wk f/u after DR. BRABHAM'S EVAL WITH AORTOGRAM    History of Present Illness:    Brandon [name] 58 y.o. 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 The patient notes that he has had difficulty swallowing for approximately 6 months. He has lost a weight 140 to 114 over 3 months. He denies any melena or hematochezia. The patient was referred to GI medicine and proceeded to undergo an upper endoscopy. A malignant appearing friable mass resulting in stenosis was noted at 33 centimeters from the incisors. This extended to the GE junction. The stomach and the proximal small bowel were not visualized. The esophageal stricture extended from 33-40 cm. Biopsy was obtained and this has returned positive for invasive squamous cell carcinoma, moderately differentiated. Esophageal ultrasound could not be done. The patient has a long-term history of smoking and continues to smoke. He has reduced his alcohol intake to a 12 pack per week over the past several months. Prior to this he notes he was drinking a 12 pack of beer a day. He's able to take liquids and some but no solid food.  Initiation of radiation 09/14/2013 and concurrent weekly Taxol/carboplatin on 09/15/2013. Cycle 5 Taxol/carboplatin 10/12/2013. Radiation completed 10/26/2013  The patient notes that his by mouth intake is continuing to improve, with almost complete resolution of his pain with eating, he continues to gain weight slowly weight today is 121 pounds   The patient's history is complicated by known peripheral vascular disease and mesenteric ischemia. In 2009 he underwent aortobifemoral bypass and bypass to  the superior mesenteric artery. 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 The patient denies any mesenteric ischemic symptoms currently.  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  The patient has had good improvement in his ability to eat, notes he occasionally has some difficulty swallowing episodically but is able to eat solid food most the time without difficulty.  Wt Readings from Last 3 Encounters:  12/03/13 119 lb (53.978 kg)  11/25/13 121 lb (54.885 kg)  11/25/13 121 lb (54.885 kg)    Current Activity/ Functional Status:  Patient is independent with mobility/ambulation, transfers, ADL's, IADL's.   Zubrod Score: At the time of surgery this patient's most appropriate activity status/level should be described as: [x]     0    Normal activity, no symptoms []     1    Restricted in physical strenuous activity but ambulatory, able to do out light work []     2    Ambulatory and capable  of self care, unable to do work activities, up and about               >50 % of waking hours                              []     3    Only limited self care, in bed greater than 50% of waking hours []     4    Completely disabled, no self care, confined to bed or chair []     5    Moribund   Past Medical History  Diagnosis Date      . Cancer 08/26/13    esophageal   . ETOH abuse     Past Surgical History  Procedure Laterality Date  .         DATE OF PROCEDURE: 07/29/2007  OPERATIVE REPORT    PROCEDURE:  1. Aortobifemoral bypass.  2. Aorta to the superior mesenteric artery bypass.  PREOPERATIVE DIAGNOSES:  1. Claudication with early rest pain, right foot.  2. Chronic mesenteric ischemia.  POSTOPERATIVE DIAGNOSES:  1. Claudication with early rest pain, right foot.  2. Chronic mesenteric ischemia. BYPASS GRAFT EVALUATION  INDICATION: Followup, aortobifem and aorta-to-SMA bypass graft.  HISTORY:  Diabetes: No.  Cardiac: No.  Hypertension: Yes.  Smoking: Yes.  Previous Surgery: Aortobifem and aorta-to-SMA bypass grafts on  07/29/07.  SINGLE LEVEL ARTERIAL EXAM  RIGHT LEFT  Brachial: 168 172  Anterior tibial: 164 148  Posterior tibial: 148 150  Peroneal:  Ankle/brachial index: 0.95 0.87  PREVIOUS ABI: Date: 08/20/07 RIGHT: 0.92 LEFT: 0.87  LOWER EXTREMITY BYPASS GRAFT DUPLEX EXAM:  DUPLEX:  1. Biphasic to monophasic waveforms noted throughout the aortobifem  bypass grafts and their native vessels with no evidence of  increased velocities noted.  2. Patent aorta-to-SMA bypass graft with no increase in velocities  noted.  3. Elevated velocity of 231 cm/s noted in the proximal celiac artery.  IMPRESSION:  1. Patent aortobifemoral and aorta-to-superior mesenteric artery  bypass grafts with no evidence of stenosis noted.  2. 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  3. Stable bilateral ankle brachial indices noted.  4. No significant change noted since the previous examinations on  08/13/07 and 08/20/07.  ___________________________________________  Jessy Oto. Fields, MD  CH/MEDQ D: 01/28/2008 T: 01/28/2008 Job: [id number]    Family History  Problem Relation Age of Onset  . Stroke Mother     History   Social History  . Marital Status: Divorced    Spouse Name: N/A    Number of Children: N/A  . Years of Education: N/A   Occupational History  . Not on file.   Social History Main Topics  . Smoking status:  Current Every Day Smoker -- 1.00 packs/day for 40 years    Types: Cigarettes    Start date: 08/31/2013  . Smokeless tobacco: Never Used     Comment: down to 1 cigarette daily  . Alcohol Use: Yes     Comment: 12 beer weekly  since last 3 months  . Drug Use: No  . Sexual Activity: Not on file   Other Topics Concern  . Not on file   Social History Narrative  . No narrative on file    History  Smoking status  . Current Every Day Smoker -- 1.00 packs/day for 40 years  . Types: Cigarettes  . Start date:  08/31/2013  Smokeless tobacco  . Never Used    Comment: down to 1 cigarette daily    History  Alcohol Use  . Yes    Comment: 12 beer weekly  since last 3 months     No Known Allergies  Current Outpatient Prescriptions  Medication Sig Dispense Refill  . aspirin EC 81 MG tablet Take 81 mg by mouth daily.      Marland Kitchen emollient (BIAFINE) cream Apply 1 application topically daily.      Marland Kitchen HYDROcodone-acetaminophen (HYCET) 7.5-325 mg/15 ml solution Take 10-15 mLs by mouth 4 (four) times daily as needed for moderate pain.  500 mL  0  . ibuprofen (ADVIL,MOTRIN) 200 MG tablet Take 200 mg by mouth every 6 (six) hours as needed for mild pain.       Marland Kitchen lactose free nutrition (BOOST) LIQD Take 237 mLs by mouth 2 (two) times daily between meals.      . prochlorperazine (COMPAZINE) 10 MG tablet Take 10 mg by mouth every 6 (six) hours as needed for nausea or vomiting.      . sucralfate (CARAFATE) 1 G tablet Take 1 g by mouth 2 (two) times daily.       No current facility-administered medications for this visit.     Review of Systems:     Cardiac Review of Systems: Y or N  Chest Pain [   n ]  Resting SOB [n] Exertional SOB  Blue.Reese  ]  Orthopnea Florencio.Farrier  ]   Pedal Edema Florencio.Farrier   ]    Palpitations Florencio.Farrier  ] Syncope  Florencio.Farrier  ]   Presyncope Florencio.Farrier   ]  General Review of Systems: [Y] = yes [  ]=no Constitional: recent weight change Minnie.Brome  ];  Wt loss over the last 3 months Aura.Spears   ] anorexia [  ]; fatigue [ y ]; nausea [  ];  night sweats [  ]; fever [  ]; or chills [  ];          Dental: poor dentition[ y]; Last Dentist visit:   Eye : blurred vision [  ]; diplopia [   ]; vision changes [  ];  Amaurosis fugax[  ]; Resp: cough [  ];  wheezing[  ];  hemoptysis[  ]; shortness of breath[  ]; paroxysmal nocturnal dyspnea[  ]; dyspnea on exertion[  ]; or orthopnea[  ];  GI:  gallstones[  ], vomiting[n  ];  dysphagia[ y ]; melena[  ];  hematochezia [  ]; heartburn[  ];   Hx of  Colonoscopy[ n ]; GU: kidney stones [  ]; hematuria[  ];   dysuria [  ];  nocturia[  ];  history of     obstruction [  ]; urinary frequency [  ]             Skin: rash, swelling[  ];, hair loss[  ];  peripheral edema[  ];  or itching[  ]; Musculosketetal: myalgias[  ];  joint swelling[  ];  joint erythema[  ];  joint pain[  ];  back pain[  ];  Heme/Lymph: bruising[  ];  bleeding[  ];  anemia[  ];  Neuro: TIA[  ];  headaches[  ];  stroke[  ];  vertigo[  ];  seizures[  ];   paresthesias[  ];  difficulty walking[ n ];  Psych:depression[  ]; anxiety[  ];  Endocrine: diabetes[ n ];  thyroid dysfunction[  ];  Immunizations: Flu up  to date [ n ]; Pneumococcal up to date [n  ];  Other:  Physical Exam: BP 144/83  Pulse 89  Resp 16  Ht 5\' 11"  (1.803 m)  Wt 119 lb (53.978 kg)  BMI 16.60 kg/m2  SpO2 99%  PHYSICAL EXAMINATION:  General appearance: alert, cooperative, appears older than stated age, cachectic and no distress Neurologic: intact Heart: regular rate and rhythm, S1, S2 normal, no murmur, click, rub or gallop Lungs: clear to auscultation bilaterally and normal percussion bilaterally Abdomen: soft, non-tender; bowel sounds normal; no masses,  no organomegaly Extremities: extremities normal, atraumatic, no cyanosis or edema and Homans sign is negative, no sign of DVT Do not appreciate cervical or supraclavicular adenopathy no axillary adenopathy, no carotid bruits No abdominal bruit Patient has palpable DP 2+ bilateral and PT 1+ bilateral  pulses  Diagnostic Studies & Laboratory data:     Recent Radiology Findings:  Ct Chest W Contrast/Ct Abdomen Pelvis W Contrast  11/10/2013   CLINICAL DATA:  Esophageal cancer. Status post chemotherapy and radiation therapy.  EXAM: CT CHEST, ABDOMEN, AND PELVIS WITH CONTRAST  TECHNIQUE: Multidetector CT imaging of the chest, abdomen and pelvis was performed following the standard protocol during bolus administration of intravenous contrast.  CONTRAST:  169mL OMNIPAQUE IOHEXOL 300 MG/ML  SOLN  COMPARISON:  Head CT 09/03/2013.  FINDINGS: CT CHEST FINDINGS  The chest wall is unremarkable and stable. No supraclavicular or axillary mass or adenopathy. The thyroid gland is normal. The bony thorax is intact. No destructive bone lesions or spinal canal compromise.  The heart is normal in size. No pericardial effusion. No mediastinal or hilar mass or adenopathy. There is tortuosity and moderate atherosclerotic calcifications involving the thoracic aorta. Coronary artery calcifications are noted. The esophagus is grossly normal. I do not see an obvious mass or surrounding adenopathy. Mild mid esophageal wall thickening is vastly improved when compared to the prior PET-CT. This would suggest a good response to treatment. No enlarged mediastinal or hilar lymph nodes.  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 This has enlarged since the study of 2009 and appears stable since the PET-CT.  The lungs are clear of acute process. No worrisome pulmonary nodules or masses. Emphysematous changes are again demonstrated. No pleural effusion.  CT ABDOMEN AND PELVIS FINDINGS  The solid abdominal organs are unremarkable. No findings for metastatic disease. Gallbladder is normal. No common bowel duct dilatation. No enlarged gastrohepatic ligament or the celiac axis lymph nodes. No mesenteric or retroperitoneal mass or adenopathy. Stable surgical changes involving  the abdominal aorta with a aortoiliac bypass graft. No complicating features.  The stomach, duodenum, small bowel and colon are unremarkable.  The bladder, prostate gland and seminal vesicles are unremarkable. No pelvic mass, adenopathy or free pelvic fluid collections. No inguinal mass or adenopathy.  The bony structures are unremarkable.  IMPRESSION: 1. Much improved appearance of the esophagus since the prior PET-CT suggesting a good response to therapy. Mild residual mid esophageal wall thickening is noted. No surrounding adenopathy. 2. Distal thoracic abdominal aortic aneurysm measuring 5.2 x 4.8 cm. This appears to be due to chronic dissection or IMH. 3. No findings for metastatic disease or adenopathy in the abdomen/pelvis. 4. Surgical changes related to aortoiliac bypass graft surgery. No complicating features.   Electronically Signed   By: Kalman Jewels M.D.   On: 11/10/2013 16:06    09/03/2013   CLINICAL DATA:  Initial treatment strategy  for esophageal mass.  EXAM: NUCLEAR MEDICINE PET SKULL BASE TO THIGH  TECHNIQUE: 5.7 mCi F-18 FDG was injected intravenously. Full-ring PET imaging was performed from the skull base to thigh after the radiotracer. CT data was obtained and used for attenuation correction and anatomic localization.  FASTING BLOOD GLUCOSE:  Value: 100 mg/dl  COMPARISON:  None.  FINDINGS: NECK  No hypermetabolic lymph nodes in the neck.  CHEST  There is a 6.5 cm segment of intense hypermetabolic activity associated with the mid to distal esophagus with SUV max equals 24.5. The activity initiates just above the carina and terminates several cm from the GE junction. No hypermetabolic mediastinal lymph nodes.  No suspicious or metabolic pulmonary nodules.  There is a precarinal lymph node measuring 13 mm (image 73) without associated metabolic activity  ABDOMEN/PELVIS  No abnormal hypermetabolic activity within the liver, pancreas, adrenal glands, or spleen. No hypermetabolic lymph nodes in  the abdomen or pelvis.  SKELETON  No hypermetabolic gastrohepatic ligament lymph nodes. No abnormal metabolic activity within the liver. No hypermetabolic abdominal pelvic nodes.  As on note of a aortic graft repair.  IMPRESSION: 1. Long segment of intense hypermetabolic activity within the mid to distal esophagus consistent with esophageal carcinoma. 2. No evidence of mediastinal metastasis, gastrohepatic ligament nodal metastasis or liver metastasis. 3. No distant metastasis.   Electronically Signed   By: Suzy Bouchard M.D.   On: 09/03/2013 09:45    Recent Lab Findings: Lab Results  Component Value Date   WBC 2.7* 10/19/2013   HGB 13.6 11/25/2013   HCT 40.0 11/25/2013   PLT 144 10/19/2013   GLUCOSE 80 11/25/2013   CHOL  Value: 105        ATP III CLASSIFICATION:  <200     mg/dL   Desirable  200-239  mg/dL   Borderline High  >=240    mg/dL   High 07/28/2007   TRIG 59 08/04/2007   HDL 32* 07/27/2007   LDLCALC  Value: 91        Total Cholesterol/HDL:CHD Risk Coronary Heart Disease Risk Table                     Men   Women  1/2 Average Risk   3.4   3.3 07/19/2007   ALT 13 10/19/2013   AST 16 10/19/2013   NA 143 11/25/2013   K 3.9 11/25/2013   CL 103 11/25/2013   CREATININE 0.90 11/25/2013   BUN <3* 11/25/2013   CO2 25 10/19/2013   INR 1.1 07/29/2007   Surgical past from [facility] on [address]. 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 enlarged since 2009 Long-term alcohol and tobacco abuse-still smoking but decrease. 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 Esophagectomy with reconstruction of the stomach would  carry unreasonable risk of gastric necrosis and severe complications in getting any beneficial effect of surgical resection after treatment with radiation and chemotherapy. Dr. Oneida Alar is arranging for a CTA of the abdomen.  I'll plan on seeing the patient back in one month  Grace Isaac MD [place] [place] [address] Office [phone]   Beeper [phone]    12/03/2013 10:25 AM

## 2013-12-04 ENCOUNTER — Ambulatory Visit
Admission: RE | Admit: 2013-12-04 | Discharge: 2013-12-04 | Disposition: A | Discharge status: Home / Self Care | Payer: 59 | Source: Ambulatory Visit | Attending: Vascular Surgery | Admitting: Vascular Surgery

## 2013-12-04 DIAGNOSIS — Z01810 Encounter for preprocedural cardiovascular examination: Secondary | ICD-10-CM

## 2013-12-04 DIAGNOSIS — I771 Stricture of artery: Secondary | ICD-10-CM

## 2013-12-04 DIAGNOSIS — I774 Celiac artery compression syndrome: Secondary | ICD-10-CM

## 2013-12-04 DIAGNOSIS — K551 Chronic vascular disorders of intestine: Secondary | ICD-10-CM

## 2013-12-04 MED ORDER — IOHEXOL 350 MG/ML SOLN
75.0000 mL | Freq: Once | INTRAVENOUS | Status: AC | PRN
  Administered 2013-12-04: 75 mL via INTRAVENOUS

## 2013-12-09 ENCOUNTER — Ambulatory Visit
Admission: RE | Admit: 2013-12-09 | Discharge: 2013-12-09 | Disposition: A | Discharge status: Home / Self Care | Payer: 59 | Source: Ambulatory Visit | Attending: Radiation Oncology | Admitting: Radiation Oncology

## 2013-12-09 ENCOUNTER — Encounter: Payer: Self-pay | Admitting: Radiation Oncology

## 2013-12-09 VITALS — BP 144/82 | HR 90 | Temp 98.3°F | Resp 20 | Ht 71.0 in | Wt 116.0 lb

## 2013-12-09 DIAGNOSIS — C155 Malignant neoplasm of lower third of esophagus: Secondary | ICD-10-CM

## 2013-12-09 HISTORY — DX: Personal history of irradiation: Z92.3

## 2013-12-09 NOTE — Progress Notes (Signed)
Follow up s/p rad tx s esophagus 09/14/13-10/26/13, patint denies pain, back to smoking 1ppd cigarettes, was appetite poor, sometimes food gets stuck, difficulty swallowing and some times food goes down easy stated patienr, ate ham biscuits, drinks mtn dew and water, no coffee, ate arbys roast beef sandwich today, eats mostly only 2 meals a day, had CT Angio abd/pelv on 12/04/13,results in  And CT chest 11/10/13, energy level fair 1:45 PM

## 2013-12-09 NOTE — Progress Notes (Signed)
Radiation Oncology         [phone] ________________________________  Name: Brandon [name] MRN: [id number]  Date: 12/09/2013  DOB: [birth year]  Follow-Up Visit Note  CC: No PCP Per Patient  Juanita Craver, MD  Diagnosis:   Adenocarcinoma of the distal esophagus/GE junction  Interval Since Last Radiation:  5 weeks   Narrative:  The patient returns today for routine follow-up.  The patient states he is doing reasonably well. His predominant complaint today is a decrease in appetite and sometimes experiencing some difficulty swallowing. On occasion he feels that food gets stuck. This is not specific for any 1 type of food. The patient had a CT scan of the chest abdomen and pelvis on 11/10/2013. This did show a much improved appearance of the esophagus. The patient has been seen by Dr. Servando Snare for consideration of surgical resection.                               ALLERGIES:  has No Known Allergies.  Meds: Current Outpatient Prescriptions  Medication Sig Dispense Refill  . aspirin EC 81 MG tablet Take 81 mg by mouth daily.      Marland Kitchen emollient (BIAFINE) cream Apply 1 application topically daily.      Marland Kitchen HYDROcodone-acetaminophen (HYCET) 7.5-325 mg/15 ml solution Take 10-15 mLs by mouth 4 (four) times daily as needed for moderate pain.  500 mL  0  . ibuprofen (ADVIL,MOTRIN) 200 MG tablet Take 200 mg by mouth every 6 (six) hours as needed for mild pain.       Marland Kitchen lactose free nutrition (BOOST) LIQD Take 237 mLs by mouth 2 (two) times daily between meals.      . prochlorperazine (COMPAZINE) 10 MG tablet Take 10 mg by mouth every 6 (six) hours as needed for nausea or vomiting.      . sucralfate (CARAFATE) 1 G tablet Take 1 g by mouth 2 (two) times daily.       No current facility-administered medications for this encounter.    Physical Findings: The patient is in no acute distress. Patient is alert and oriented.  height is 5\' 11"  (1.803 m) and weight is 116 lb (52.617 kg). His oral temperature  is 98.3 F (36.8 C). His blood pressure is 144/82 and his pulse is 90. His respiration is 20 and oxygen saturation is 100%. .   General: Well-developed, in no acute distress HEENT: Normocephalic, atraumatic Cardiovascular: Regular rate and rhythm Respiratory: Clear to auscultation bilaterally GI: Soft, nontender, normal bowel sounds Extremities: No edema present  Lab Findings: Lab Results  Component Value Date   WBC 2.7* 10/19/2013   HGB 13.6 11/25/2013   HCT 40.0 11/25/2013   MCV 96.5 10/19/2013   PLT 144 10/19/2013     Radiographic Findings: Ct Chest W Contrast  11/10/2013   CLINICAL DATA:  Esophageal cancer. Status post chemotherapy and radiation therapy.  EXAM: CT CHEST, ABDOMEN, AND PELVIS WITH CONTRAST  TECHNIQUE: Multidetector CT imaging of the chest, abdomen and pelvis was performed following the standard protocol during bolus administration of intravenous contrast.  CONTRAST:  131mL OMNIPAQUE IOHEXOL 300 MG/ML  SOLN  COMPARISON:  Head CT 09/03/2013.  FINDINGS: CT CHEST FINDINGS  The chest wall is unremarkable and stable. No supraclavicular or axillary mass or adenopathy. The thyroid gland is normal. The bony thorax is intact. No destructive bone lesions or spinal canal compromise.  The heart is normal in size. No pericardial  effusion. No mediastinal or hilar mass or adenopathy. There is tortuosity and moderate atherosclerotic calcifications involving the thoracic aorta. Coronary artery calcifications are noted. The esophagus is grossly normal. I do not see an obvious mass or surrounding adenopathy. Mild mid esophageal wall thickening is vastly improved when compared to the prior PET-CT. This would suggest a good response to treatment. No enlarged mediastinal or hilar lymph nodes.  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 This has enlarged since the study of 2009 and appears stable since the PET-CT.  The lungs are  clear of acute process. No worrisome pulmonary nodules or masses. Emphysematous changes are again demonstrated. No pleural effusion.  CT ABDOMEN AND PELVIS FINDINGS  The solid abdominal organs are unremarkable. No findings for metastatic disease. Gallbladder is normal. No common bowel duct dilatation. No enlarged gastrohepatic ligament or the celiac axis lymph nodes. No mesenteric or retroperitoneal mass or adenopathy. Stable surgical changes involving the abdominal aorta with a aortoiliac bypass graft. No complicating features.  The stomach, duodenum, small bowel and colon are unremarkable.  The bladder, prostate gland and seminal vesicles are unremarkable. No pelvic mass, adenopathy or free pelvic fluid collections. No inguinal mass or adenopathy.  The bony structures are unremarkable.  IMPRESSION: 1. Much improved appearance of the esophagus since the prior PET-CT suggesting a good response to therapy. Mild residual mid esophageal wall thickening is noted. No surrounding adenopathy. 2. Distal thoracic abdominal aortic aneurysm measuring 5.2 x 4.8 cm. This appears to be due to chronic dissection or IMH. 3. No findings for metastatic disease or adenopathy in the abdomen/pelvis. 4. Surgical changes related to aortoiliac bypass graft surgery. No complicating features.   Electronically Signed   By: Kalman Jewels M.D.   On: 11/10/2013 16:06   Ct Abdomen Pelvis W Contrast  11/10/2013   CLINICAL DATA:  Esophageal cancer. Status post chemotherapy and radiation therapy.  EXAM: CT CHEST, ABDOMEN, AND PELVIS WITH CONTRAST  TECHNIQUE: Multidetector CT imaging of the chest, abdomen and pelvis was performed following the standard protocol during bolus administration of intravenous contrast.  CONTRAST:  151mL OMNIPAQUE IOHEXOL 300 MG/ML  SOLN  COMPARISON:  Head CT 09/03/2013.  FINDINGS: CT CHEST FINDINGS  The chest wall is unremarkable and stable. No supraclavicular or axillary mass or adenopathy. The thyroid gland is  normal. The bony thorax is intact. No destructive bone lesions or spinal canal compromise.  The heart is normal in size. No pericardial effusion. No mediastinal or hilar mass or adenopathy. There is tortuosity and moderate atherosclerotic calcifications involving the thoracic aorta. Coronary artery calcifications are noted. The esophagus is grossly normal. I do not see an obvious mass or surrounding adenopathy. Mild mid esophageal wall thickening is vastly improved when compared to the prior PET-CT. This would suggest a good response to treatment. No enlarged mediastinal or hilar lymph nodes.  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 This has enlarged since the study of 2009 and appears stable since the PET-CT.  The lungs are clear of acute process. No worrisome pulmonary nodules or masses. Emphysematous changes are again demonstrated. No pleural effusion.  CT ABDOMEN AND PELVIS FINDINGS  The solid abdominal organs are unremarkable. No findings for metastatic disease. Gallbladder is normal. No common bowel duct dilatation. No enlarged gastrohepatic ligament or the celiac axis lymph nodes. No mesenteric or retroperitoneal mass or adenopathy.  Stable surgical changes involving the abdominal aorta with a aortoiliac bypass graft. No complicating features.  The stomach, duodenum, small bowel and colon are unremarkable.  The bladder, prostate gland and seminal vesicles are unremarkable. No pelvic mass, adenopathy or free pelvic fluid collections. No inguinal mass or adenopathy.  The bony structures are unremarkable.  IMPRESSION: 1. Much improved appearance of the esophagus since the prior PET-CT suggesting a good response to therapy. Mild residual mid esophageal wall thickening is noted. No surrounding adenopathy. 2. Distal thoracic abdominal aortic aneurysm measuring 5.2 x 4.8 cm. This appears to be due to chronic dissection or IMH. 3. No findings  for metastatic disease or adenopathy in the abdomen/pelvis. 4. Surgical changes related to aortoiliac bypass graft surgery. No complicating features.   Electronically Signed   By: Kalman Jewels M.D.   On: 11/10/2013 16:06   Ct Angio Abd/pel W/ And/or W/o  12/04/2013   CLINICAL DATA:  Prior aortic iliac bypass, abdominal vascular disease, mesenteric arterial insufficiency  EXAM: CTA ABDOMEN AND PELVIS WITH CONTRAST  TECHNIQUE: Multidetector CT imaging of the abdomen and pelvis was performed using the standard protocol during bolus administration of intravenous contrast. Multiplanar reconstructed images and MIPs were obtained and reviewed to evaluate the vascular anatomy.  CONTRAST:  64mL OMNIPAQUE IOHEXOL 350 MG/ML SOLN  COMPARISON:  11/10/2013  FINDINGS: Descending thoracic aorta at the diaphragmatic hiatus has a stable aneurysm measuring 5.2 x 5.1 cm. Displaced mural calcifications noted medially, suspect related to a chronic focal dissection or intramural hemorrhage. No significant interval change. Mural thrombus remains. Patent aortic lumen.  Abdominal aorta demonstrates prior aorto bi-iliac reconstruction. Bypass graft appears patent. Patent aorta SMA bypass as well.  Celiac origin is occluded. The celiac branches are well reconstituted via SMA collateral pathways.  Native SMA origin demonstrates a severe stenosis blood remains patent (proximal to the mesenteric bypass conduit). Main renal arteries are patent. No definite accessory renal vasculature demonstrated.  Nonvascular: Hyperinflation of the lower lobes compatible with emphysema. Normal heart size. No pericardial or pleural effusion.  Liver, gallbladder, biliary system, pancreas, spleen, adrenal glands, and kidneys are within normal limits for age and demonstrate no acute process. Mildly enlarged gastrohepatic ligament lymph node measuring 15 mm, image 17 series 8. Patient has a history of distal esophageal carcinoma. Nodal involvement not excluded.   Negative for bowel obstruction, dilatation, ileus, or free air.  No abdominal or pelvic free fluid, fluid collection, hemorrhage, abscess, or adenopathy.  No inguinal hernia.  Urinary bladder unremarkable.  No acute distal bowel process.  Degenerative changes of the spine and hips. No acute osseous finding.  Review of the MIP images confirms the above findings.  IMPRESSION: Patent aorto bi-iliac bypass graft.  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  Patent renal arteries  Stable descending thoracic aortic aneurysm measuring 5.2 x 5.1 cm, suspect chronic focal dissection versus prior intramural hemorrhage.  No acute intra-abdominal or pelvic process  Mild gastrohepatic ligament adenopathy, nodal involvement from known esophageal carcinoma not excluded.   Electronically Signed   By: Daryll Brod M.D.   On: 12/04/2013 13:01    Impression:     the patient is status post chemotherapy radiation treatment for adenocarcinoma of the distal esophagus/GE junction. He is doing reasonably well at this time although I am concerned about his weight. This is still very similar to when he was initially seen. The patient states that he is going  to began drinking boost once again. I do believe that this would be helpful. The patient is scheduled to see medical oncology in the near future and also is going to be seen later this month again by Dr. Servando Snare. The patient has some comorbidities with vascular disease which is impacting potential plans to move forward with surgery.  Plan:   the patient will followup in 3 months.    Jodelle Gross, M.D., Ph.D.

## 2013-12-10 ENCOUNTER — Telehealth: Payer: Self-pay | Admitting: Oncology

## 2013-12-10 ENCOUNTER — Ambulatory Visit (HOSPITAL_BASED_OUTPATIENT_CLINIC_OR_DEPARTMENT_OTHER): Payer: 59 | Admitting: Nurse Practitioner

## 2013-12-10 ENCOUNTER — Other Ambulatory Visit: Payer: 59

## 2013-12-10 VITALS — BP 148/83 | HR 87 | Temp 98.4°F | Resp 20 | Ht 71.0 in | Wt 116.7 lb

## 2013-12-10 DIAGNOSIS — C159 Malignant neoplasm of esophagus, unspecified: Secondary | ICD-10-CM

## 2013-12-10 DIAGNOSIS — R1310 Dysphagia, unspecified: Secondary | ICD-10-CM

## 2013-12-10 DIAGNOSIS — C787 Secondary malignant neoplasm of liver and intrahepatic bile duct: Secondary | ICD-10-CM

## 2013-12-10 NOTE — Progress Notes (Signed)
  [facility] OFFICE PROGRESS NOTE   Diagnosis:  Esophagus cancer.  INTERVAL HISTORY:   Brandon [name] returns as scheduled. He occasionally experiences dysphagia with some solids. No difficulty with liquid. Overall the dysphagia is much improved compared with prior to chemotherapy and radiation therapy. His appetite is poor. He stopped drinking nutritional supplements due to the cost. He denies pain. He has a good energy level.  Objective:  Vital signs in last 24 hours:  Blood pressure 148/83, pulse 87, temperature 98.4 F (36.9 C), temperature source Oral, resp. rate 20, height 5\' 11"  (1.803 m), weight 116 lb 11.2 oz (52.935 kg).    HEENT: No thrush or ulcerations. Lymphatics: No palpable cervical, supraclavicular or axillary lymph nodes. Resp: Lungs clear. Cardio: Regular cardiac rhythm. GI: Abdomen is soft and nontender. No hepatomegaly. Vascular: No leg edema.   Lab Results:  Lab Results  Component Value Date   WBC 2.7* 10/19/2013   HGB 13.6 11/25/2013   HCT 40.0 11/25/2013   MCV 96.5 10/19/2013   PLT 144 10/19/2013   NEUTROABS 1.8 10/19/2013    Imaging:  No results found.  Medications: I have reviewed the patient's current medications.  Assessment/Plan: 1. 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  EGD by Dr. Collene Mares on 08/26/2013 with findings of a malignant appearing friable mass causing stenosis at 33 cm from the incisors extending to the GE junction. Stomach and the proximal small bowel were not visualized.  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  Staging PET scan on 09/03/2013 showed a 6.5 cm segment of intense hypermetabolic activity associated with the mid to distal esophagus with SUV max 24.5. There was no evidence of mediastinal metastasis, gastrohepatic ligament nodal metastasis or liver metastasis.  Initiation of radiation 09/14/2013 and concurrent weekly Taxol/carboplatin  on 09/15/2013. Cycle 5 Taxol/carboplatin 10/12/2013. Radiation completed 10/26/2013.  Restaging CTs 11/10/2013 with no evidence of metastatic disease and improvement in the esophagus wall thickening. 2. Dysphagia secondary to #1. Improved. 3. Weight loss secondary to #1.  4. Peripheral vascular disease. 5. History of mesenteric ischemia. 6. Odynophagia secondary to radiation. Resolved.   Disposition: [name] appears stable. He continues preoperative evaluation by Dr. Servando Snare and Dr. Oneida Alar. We scheduled a return visit here in 8 weeks.   We provided him with some nutritional supplements. He will contact the office with weight loss.  Plan reviewed with Dr. Benay Spice.  Ned Card ANP/GNP-BC   12/10/2013  12:56 PM

## 2013-12-10 NOTE — Telephone Encounter (Signed)
gave pt appt for ML

## 2013-12-17 ENCOUNTER — Ambulatory Visit: Payer: 59 | Admitting: Radiation Oncology

## 2013-12-18 ENCOUNTER — Other Ambulatory Visit: Payer: Self-pay

## 2013-12-21 ENCOUNTER — Other Ambulatory Visit: Payer: Self-pay | Admitting: *Deleted

## 2013-12-21 ENCOUNTER — Telehealth: Payer: Self-pay | Admitting: Vascular Surgery

## 2013-12-21 DIAGNOSIS — I716 Thoracoabdominal aortic aneurysm, without rupture, unspecified: Secondary | ICD-10-CM

## 2013-12-21 DIAGNOSIS — I712 Thoracic aortic aneurysm, without rupture, unspecified: Secondary | ICD-10-CM

## 2013-12-21 DIAGNOSIS — Z48812 Encounter for surgical aftercare following surgery on the circulatory system: Secondary | ICD-10-CM

## 2013-12-21 DIAGNOSIS — Z01810 Encounter for preprocedural cardiovascular examination: Secondary | ICD-10-CM

## 2013-12-21 DIAGNOSIS — I708 Atherosclerosis of other arteries: Secondary | ICD-10-CM

## 2013-12-21 NOTE — Telephone Encounter (Signed)
Message copied by Gena Fray on Mon Dec 21, 2013 11:14 AM ------      Message from: Denman George      Created: Fri Dec 18, 2013  4:07 PM      Regarding: RE: ? follow up       This pt. does need to see [facility] in f/u from [facility] to discuss results/ treatment for celiac occlusion.            ----- Message -----         From: Gena Fray         Sent: 12/16/2013   3:30 PM           To: Mena Goes, CMA      Subject: ? follow up                                              Mr Jarrells had an [place] (11/25/13)  followed by a [facility]. It looks as though Dr Servando Snare is following him. Is there any need for an OV with our practice?            Thanks,      Hinton Dyer       ------

## 2013-12-21 NOTE — Telephone Encounter (Signed)
Spoke with patient to schedule follow up, dpm

## 2013-12-31 ENCOUNTER — Encounter: Payer: Self-pay | Admitting: Cardiothoracic Surgery

## 2013-12-31 ENCOUNTER — Ambulatory Visit (INDEPENDENT_AMBULATORY_CARE_PROVIDER_SITE_OTHER): Payer: 59 | Admitting: Cardiothoracic Surgery

## 2013-12-31 VITALS — BP 149/96 | HR 92 | Resp 20 | Ht 71.0 in | Wt 116.0 lb

## 2013-12-31 DIAGNOSIS — C159 Malignant neoplasm of esophagus, unspecified: Secondary | ICD-10-CM

## 2013-12-31 DIAGNOSIS — I716 Thoracoabdominal aortic aneurysm, without rupture, unspecified: Secondary | ICD-10-CM

## 2013-12-31 NOTE — Progress Notes (Signed)
[facility] Record #[id number] Date of Birth: [birth year]  Referring: Marye Round, MD, & Dr Benay Spice Primary Care: No PCP Per Patient  Chief Complaint:    No chief complaint on file.   History of Present Illness:    Brandon [name] 58 y.o. 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 The patient notes that he has had difficulty swallowing for approximately 6 months. He has lost a weight 140 to 114 over 3 months. He denies any melena or hematochezia. The patient was referred to GI medicine and proceeded to undergo an upper endoscopy. A malignant appearing friable mass resulting in stenosis was noted at 33 centimeters from the incisors. This extended to the GE junction. The stomach and the proximal small bowel were not visualized. The esophageal stricture extended from 33-40 cm. Biopsy was obtained and this has returned positive for invasive squamous cell carcinoma, moderately differentiated. Esophageal ultrasound could not be done. The patient has a long-term history of smoking and continues to smoke. He has reduced his alcohol intake to a 12 pack per week over the past several months. Prior to this he notes he was drinking a 12 pack of beer a day. He's able to take liquids and some but no solid food.  Initiation of radiation 09/14/2013 and concurrent weekly Taxol/carboplatin on 09/15/2013. Cycle 5 Taxol/carboplatin 10/12/2013. Radiation completed 10/26/2013  The patient notes that his by mouth intake is continuing to improve, with almost complete resolution of his pain with eating, he continues to gain weight slowly weight today is 121 pounds   The patient's history is complicated by known peripheral vascular disease and mesenteric ischemia. In 2009 he underwent aortobifemoral bypass and bypass to the superior mesenteric artery. 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 The patient denies any mesenteric ischemic symptoms currently.  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  The patient has had good improvement in his ability to eat, notes he occasionally has some difficulty swallowing episodically but is able to eat solid food most the time without difficulty.  Wt Readings from Last 3 Encounters:  12/10/13 116 lb 11.2 oz (52.935 kg)  12/09/13 116 lb (52.617 kg)  12/03/13 119 lb (53.978 kg)    Current Activity/ Functional Status:  Patient is independent with mobility/ambulation, transfers, ADL's, IADL's.   Zubrod Score: At the time of surgery this patient's most appropriate activity status/level should be described as: [x]     0    Normal activity, no symptoms []     1    Restricted in physical strenuous activity but ambulatory, able to do out light work []     2    Ambulatory and capable of self care, unable to do work activities, up and about               >  50 % of waking hours                              []     3    Only limited self care, in bed greater than 50% of waking hours []     4    Completely disabled, no self care, confined to bed or chair []     5    Moribund   Past Medical History  Diagnosis Date      . Cancer 08/26/13    esophageal   . ETOH abuse     Past Surgical History  Procedure Laterality Date  .         DATE OF PROCEDURE: 07/29/2007  OPERATIVE REPORT  PROCEDURE:  1. Aortobifemoral bypass.  2. Aorta to the superior mesenteric artery bypass.   PREOPERATIVE DIAGNOSES:  1. Claudication with early rest pain, right foot.  2. Chronic mesenteric ischemia.  POSTOPERATIVE DIAGNOSES:  1. Claudication with early rest pain, right foot.  2. Chronic mesenteric ischemia. BYPASS GRAFT EVALUATION  INDICATION: Followup, aortobifem and aorta-to-SMA bypass graft.  HISTORY:  Diabetes: No.  Cardiac: No.  Hypertension: Yes.  Smoking: Yes.  Previous Surgery: Aortobifem and aorta-to-SMA bypass grafts on  07/29/07.  SINGLE LEVEL ARTERIAL EXAM  RIGHT LEFT  Brachial: 168 172  Anterior tibial: 164 148  Posterior tibial: 148 150  Peroneal:  Ankle/brachial index: 0.95 0.87  PREVIOUS ABI: Date: 08/20/07 RIGHT: 0.92 LEFT: 0.87  LOWER EXTREMITY BYPASS GRAFT DUPLEX EXAM:  DUPLEX:  1. Biphasic to monophasic waveforms noted throughout the aortobifem  bypass grafts and their native vessels with no evidence of  increased velocities noted.  2. Patent aorta-to-SMA bypass graft with no increase in velocities  noted.  3. Elevated velocity of 231 cm/s noted in the proximal celiac artery.  IMPRESSION:  1. Patent aortobifemoral and aorta-to-superior mesenteric artery  bypass grafts with no evidence of stenosis noted.  2. 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  3. Stable bilateral ankle brachial indices noted.  4. No significant change noted since the previous examinations on  08/13/07 and 08/20/07.  ___________________________________________  Jessy Oto. Fields, MD  CH/MEDQ D: 01/28/2008 T: 01/28/2008 Job: [id number]    Family History  Problem Relation Age of Onset  . Stroke Mother     History   Social History  . Marital Status: Divorced    Spouse Name: N/A    Number of Children: N/A  . Years of Education: N/A   Occupational History  . Not on file.   Social History Main Topics  . Smoking status: Current Every Day Smoker -- 1.00 packs/day for 40 years    Types: Cigarettes    Start date:  08/31/2013  . Smokeless tobacco: Never Used     Comment: down to 1 cigarette daily  . Alcohol Use: Yes     Comment: 12 beer weekly  since last 3 months  . Drug Use: No  . Sexual Activity: Not on file   Other Topics Concern  . Not on file   Social History Narrative  . No narrative on file    History  Smoking status  . Current Every Day Smoker -- 1.00 packs/day for 40 years  . Types: Cigarettes  . Start date: 08/31/2013  Smokeless tobacco  . Never Used    Comment: down to 1 cigarette daily    History  Alcohol Use  .  Yes    Comment: 12 beer weekly  since last 3 months     No Known Allergies  Current Outpatient Prescriptions  Medication Sig Dispense Refill  . aspirin EC 81 MG tablet Take 81 mg by mouth daily.      Marland Kitchen emollient (BIAFINE) cream Apply 1 application topically daily.      Marland Kitchen HYDROcodone-acetaminophen (HYCET) 7.5-325 mg/15 ml solution Take 10-15 mLs by mouth 4 (four) times daily as needed for moderate pain.  500 mL  0  . ibuprofen (ADVIL,MOTRIN) 200 MG tablet Take 200 mg by mouth every 6 (six) hours as needed for mild pain.       Marland Kitchen lactose free nutrition (BOOST) LIQD Take 237 mLs by mouth 2 (two) times daily between meals.      . prochlorperazine (COMPAZINE) 10 MG tablet Take 10 mg by mouth every 6 (six) hours as needed for nausea or vomiting.      . sucralfate (CARAFATE) 1 G tablet Take 1 g by mouth 2 (two) times daily.       No current facility-administered medications for this visit.     Review of Systems:     Cardiac Review of Systems: Y or N  Chest Pain [   n ]  Resting SOB [n] Exertional SOB  Blue.Reese  ]  Orthopnea Florencio.Farrier  ]   Pedal Edema Florencio.Farrier   ]    Palpitations Florencio.Farrier  ] Syncope  Florencio.Farrier  ]   Presyncope Florencio.Farrier   ]  General Review of Systems: [Y] = yes [  ]=no Constitional: recent weight change Minnie.Brome  ];  Wt loss over the last 3 months Aura.Spears   ] anorexia [  ]; fatigue [ y ]; nausea [  ]; night sweats [  ]; fever [  ]; or chills [  ];          Dental: poor dentition[ y]; Last  Dentist visit:   Eye : blurred vision [  ]; diplopia [   ]; vision changes [  ];  Amaurosis fugax[  ]; Resp: cough [  ];  wheezing[  ];  hemoptysis[  ]; shortness of breath[  ]; paroxysmal nocturnal dyspnea[  ]; dyspnea on exertion[  ]; or orthopnea[  ];  GI:  gallstones[  ], vomiting[n  ];  dysphagia[ y ]; melena[  ];  hematochezia [  ]; heartburn[  ];   Hx of  Colonoscopy[ n ]; GU: kidney stones [  ]; hematuria[  ];   dysuria [  ];  nocturia[  ];  history of     obstruction [  ]; urinary frequency [  ]             Skin: rash, swelling[  ];, hair loss[  ];  peripheral edema[  ];  or itching[  ]; Musculosketetal: myalgias[  ];  joint swelling[  ];  joint erythema[  ];  joint pain[  ];  back pain[  ];  Heme/Lymph: bruising[  ];  bleeding[  ];  anemia[  ];  Neuro: TIA[  ];  headaches[  ];  stroke[  ];  vertigo[  ];  seizures[  ];   paresthesias[  ];  difficulty walking[ n ];  Psych:depression[  ]; anxiety[  ];  Endocrine: diabetes[ n ];  thyroid dysfunction[  ];  Immunizations: Flu up to date [ n ]; Pneumococcal up to date Florencio.Farrier  ];  Other:  Physical Exam: There were no vitals taken for this visit.  PHYSICAL EXAMINATION:  General appearance: alert, cooperative, appears older than stated age, cachectic and no distress Neurologic: intact Heart: regular rate and rhythm, S1, S2 normal, no murmur, click, rub or gallop Lungs: clear to auscultation bilaterally and normal percussion bilaterally Abdomen: soft, non-tender; bowel sounds normal; no masses,  no organomegaly Extremities: extremities normal, atraumatic, no cyanosis or edema and Homans sign is negative, no sign of DVT Do not appreciate cervical or supraclavicular adenopathy no axillary adenopathy, no carotid bruits No abdominal bruit Patient has palpable DP 2+ bilateral and PT 1+ bilateral pulses  Diagnostic Studies & Laboratory data:     Recent Radiology Findings:  Ct Angio Abd/pel W/ And/or W/o  12/04/2013   CLINICAL DATA:  Prior  aortic iliac bypass, abdominal vascular disease, mesenteric arterial insufficiency  EXAM: CTA ABDOMEN AND PELVIS WITH CONTRAST  TECHNIQUE: Multidetector CT imaging of the abdomen and pelvis was performed using the standard protocol during bolus administration of intravenous contrast. Multiplanar reconstructed images and MIPs were obtained and reviewed to evaluate the vascular anatomy.  CONTRAST:  24mL OMNIPAQUE IOHEXOL 350 MG/ML SOLN  COMPARISON:  11/10/2013  FINDINGS: Descending thoracic aorta at the diaphragmatic hiatus has a stable aneurysm measuring 5.2 x 5.1 cm. Displaced mural calcifications noted medially, suspect related to a chronic focal dissection or intramural hemorrhage. No significant interval change. Mural thrombus remains. Patent aortic lumen.  Abdominal aorta demonstrates prior aorto bi-iliac reconstruction. Bypass graft appears patent. Patent aorta SMA bypass as well.  Celiac origin is occluded. The celiac branches are well reconstituted via SMA collateral pathways.  Native SMA origin demonstrates a severe stenosis blood remains patent (proximal to the mesenteric bypass conduit). Main renal arteries are patent. No definite accessory renal vasculature demonstrated.  Nonvascular: Hyperinflation of the lower lobes compatible with emphysema. Normal heart size. No pericardial or pleural effusion.  Liver, gallbladder, biliary system, pancreas, spleen, adrenal glands, and kidneys are within normal limits for age and demonstrate no acute process. Mildly enlarged gastrohepatic ligament lymph node measuring 15 mm, image 17 series 8. Patient has a history of distal esophageal carcinoma. Nodal involvement not excluded.  Negative for bowel obstruction, dilatation, ileus, or free air.  No abdominal or pelvic free fluid, fluid collection, hemorrhage, abscess, or adenopathy.  No inguinal hernia.  Urinary bladder unremarkable.  No acute distal bowel process.  Degenerative changes of the spine and hips. No acute  osseous finding.  Review of the MIP images confirms the above findings.  IMPRESSION: Patent aorto bi-iliac bypass graft.  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  Patent renal arteries  Stable descending thoracic aortic aneurysm measuring 5.2 x 5.1 cm, suspect chronic focal dissection versus prior intramural hemorrhage.  No acute intra-abdominal or pelvic process  Mild gastrohepatic ligament adenopathy, nodal involvement from known esophageal carcinoma not excluded.   Electronically Signed   By: Daryll Brod M.D.   On: 12/04/2013 13:01   Ct Chest W Contrast/Ct Abdomen Pelvis W Contrast  11/10/2013   CLINICAL DATA:  Esophageal cancer. Status post chemotherapy and radiation therapy.  EXAM: CT CHEST, ABDOMEN, AND PELVIS WITH CONTRAST  TECHNIQUE: Multidetector CT imaging of the chest, abdomen and pelvis was performed following the standard protocol during bolus administration of intravenous contrast.  CONTRAST:  133mL OMNIPAQUE IOHEXOL 300 MG/ML  SOLN  COMPARISON:  Head CT 09/03/2013.  FINDINGS: CT CHEST FINDINGS  The chest wall is unremarkable and stable. No supraclavicular or axillary mass or adenopathy.  The thyroid gland is normal. The bony thorax is intact. No destructive bone lesions or spinal canal compromise.  The heart is normal in size. No pericardial effusion. No mediastinal or hilar mass or adenopathy. There is tortuosity and moderate atherosclerotic calcifications involving the thoracic aorta. Coronary artery calcifications are noted. The esophagus is grossly normal. I do not see an obvious mass or surrounding adenopathy. Mild mid esophageal wall thickening is vastly improved when compared to the prior PET-CT. This would suggest a good response to treatment. No enlarged mediastinal or hilar lymph nodes.  At the thoracoabdominal junction there is a 5.2 x 4.8 cm aneurysm of the aorta likely due to chronic  dissection/IMH. This has enlarged since the study of 2009 and appears stable since the PET-CT.  The lungs are clear of acute process. No worrisome pulmonary nodules or masses. Emphysematous changes are again demonstrated. No pleural effusion.  CT ABDOMEN AND PELVIS FINDINGS  The solid abdominal organs are unremarkable. No findings for metastatic disease. Gallbladder is normal. No common bowel duct dilatation. No enlarged gastrohepatic ligament or the celiac axis lymph nodes. No mesenteric or retroperitoneal mass or adenopathy. Stable surgical changes involving the abdominal aorta with a aortoiliac bypass graft. No complicating features.  The stomach, duodenum, small bowel and colon are unremarkable.  The bladder, prostate gland and seminal vesicles are unremarkable. No pelvic mass, adenopathy or free pelvic fluid collections. No inguinal mass or adenopathy.  The bony structures are unremarkable.  IMPRESSION: 1. Much improved appearance of the esophagus since the prior PET-CT suggesting a good response to therapy. Mild residual mid esophageal wall thickening is noted. No surrounding adenopathy. 2. Distal thoracic abdominal aortic aneurysm measuring 5.2 x 4.8 cm. This appears to be due to chronic dissection or IMH. 3. No findings for metastatic disease or adenopathy in the abdomen/pelvis. 4. Surgical changes related to aortoiliac bypass graft surgery. No complicating features.   Electronically Signed   By: Kalman Jewels M.D.   On: 11/10/2013 16:06    09/03/2013   CLINICAL DATA:  Initial treatment strategy for esophageal mass.  EXAM: NUCLEAR MEDICINE PET SKULL BASE TO THIGH  TECHNIQUE: 5.7 mCi F-18 FDG was injected intravenously. Full-ring PET imaging was performed from the skull base to thigh after the radiotracer. CT data was obtained and used for attenuation correction and anatomic localization.  FASTING BLOOD GLUCOSE:  Value: 100 mg/dl  COMPARISON:  None.  FINDINGS: NECK  No hypermetabolic lymph nodes in the  neck.  CHEST  There is a 6.5 cm segment of intense hypermetabolic activity associated with the mid to distal esophagus with SUV max equals 24.5. The activity initiates just above the carina and terminates several cm from the GE junction. No hypermetabolic mediastinal lymph nodes.  No suspicious or metabolic pulmonary nodules.  There is a precarinal lymph node measuring 13 mm (image 73) without associated metabolic activity  ABDOMEN/PELVIS  No abnormal hypermetabolic activity within the liver, pancreas, adrenal glands, or spleen. No hypermetabolic lymph nodes in the abdomen or pelvis.  SKELETON  No hypermetabolic gastrohepatic ligament lymph nodes. No abnormal metabolic activity within the liver. No hypermetabolic abdominal pelvic nodes.  As on note of a aortic graft repair.  IMPRESSION: 1. Long segment of intense hypermetabolic activity within the mid to distal esophagus consistent with esophageal carcinoma. 2. No evidence of mediastinal metastasis, gastrohepatic ligament nodal metastasis or liver metastasis. 3. No distant metastasis.   Electronically Signed   By: Suzy Bouchard M.D.   On: 09/03/2013 09:45  Recent Lab Findings: Lab Results  Component Value Date   WBC 2.7* 10/19/2013   HGB 13.6 11/25/2013   HCT 40.0 11/25/2013   PLT 144 10/19/2013   GLUCOSE 80 11/25/2013   CHOL  Value: 105        ATP III CLASSIFICATION:  <200     mg/dL   Desirable  200-239  mg/dL   Borderline High  >=240    mg/dL   High 07/28/2007   TRIG 59 08/04/2007   HDL 32* 07/27/2007   LDLCALC  Value: 91        Total Cholesterol/HDL:CHD Risk Coronary Heart Disease Risk Table                     Men   Women  1/2 Average Risk   3.4   3.3 07/19/2007   ALT 13 10/19/2013   AST 16 10/19/2013   NA 143 11/25/2013   K 3.9 11/25/2013   CL 103 11/25/2013   CREATININE 0.90 11/25/2013   BUN <3* 11/25/2013   CO2 25 10/19/2013   INR 1.1 07/29/2007   Surgical past from [facility] on [address] [place] DS [id number] dated 08/28/2013 Reported as esophagus lower  third invasive squamous cell carcinoma moderately differentiated  Assessment / Plan:  Locally advanced squamous cell carcinoma of the mid and distal third of the esophagus History of aortobifem with SMA bypass for chronic mesenteric ischemia, probable occluded celiac artery Distal thoracic abdominal aortic aneurysm measuring 5.2 x 4.8 cm. enlarged since 2009 Long-term alcohol and tobacco abuse-still smoking but decrease. With the patient's significant intra-abdominal vascular disease including probable occlusion of the celiac artery, occlusion of the SMA with a patent graft. Esophagectomy with reconstruction of the stomach would  carry unreasonable risk of gastric necrosis and severe complications in getting any beneficial effect of surgical resection after treatment with radiation and chemotherapy. After the arteriogram and CTA of the abdomen I discussed the vascular situation in the abdomen with Dr. Oneida Alar is arranging for a CTA of the abdomen.   With the degree of the patient's abdominal vascular disease resection of his esophagus with reconstruction would carry a high risk of compromise of the vascular integrity of the stomach or colon being used for reconstruction. Would confine her treatment to chemotherapy, and consider completion of a full course of radiation therapy.  Grace Isaac MD [place] [place] [address] Office [phone]   Beeper [phone]    12/31/2013 1:04 PM

## 2014-01-06 ENCOUNTER — Encounter: Payer: Self-pay | Admitting: Vascular Surgery

## 2014-01-07 ENCOUNTER — Encounter: Payer: Self-pay | Admitting: Vascular Surgery

## 2014-01-07 ENCOUNTER — Ambulatory Visit (INDEPENDENT_AMBULATORY_CARE_PROVIDER_SITE_OTHER): Payer: 59 | Admitting: Vascular Surgery

## 2014-01-07 VITALS — BP 145/84 | HR 80 | Ht 71.0 in | Wt 121.6 lb

## 2014-01-07 DIAGNOSIS — I771 Stricture of artery: Secondary | ICD-10-CM | POA: Insufficient documentation

## 2014-01-07 DIAGNOSIS — I774 Celiac artery compression syndrome: Secondary | ICD-10-CM

## 2014-01-07 NOTE — Progress Notes (Signed)
History of Present Illness  Brandon [name] is a 58 y.o. 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 His is status post aorto-bifemoral bypass and superior mesenteric artery bypass surgery by Dr. Oneida Alar in 2009. He was diagnosed with esophageal cancer 5 months ago and has completed chemotherapy and radiation.The patient notes epigastric pain "around the esophagus" intermittently with eating over the past month. The pain is not associated with every eating event. He denies any fear with food consumption.  The patient describes his weight as fluctuating, "going up and down."  On ROS today: he denies any chest pain, shortness of breath, abdominal pain, melena, hematochezia, intermittent claudication, and rest pain. All other systems are negative.  He has currently takes a daily 81mg  aspirin. He is not taking any blood thinners. He smokes 1/2 pack of cigarettes per day.   His thoracic abdominal aortic aneurysm has enlarged to 5.2 x 4.8 cm since his last CT scan in 2009.     Past Medical History   Diagnosis  Date   .  Cancer  08/26/13       esophageal path pending   .  ETOH abuse     .  Tobacco abuse     .  Chronic mesenteric arterial insufficiency syndrome         s/p sma bypass   .  History of aorta-iliac-femoral bypass         Past Surgical History   Procedure  Laterality  Date   .  Cardiac surgery           femoral bypass surgery    aortobifemoral bypass with SMA bypass    History      Social History   .  Marital Status:  Divorced       Spouse Name:  N/A       Number of Children:  N/A   .  Years of Education:  N/A      Occupational History   .  Not on file.      Social History Main Topics   .  Smoking status:  Current Every Day Smoker -- 1.00 packs/day for 40 years       Types:  Cigarettes       Start date:   08/31/2013   .  Smokeless tobacco:  Never Used         Comment: down to 1 cigarette daily   .  Alcohol Use:  Yes         Comment: 12 beer weekly  since last 3 months   .  Drug Use:  No   .  Sexual Activity:  Not on file      Other Topics  Concern   .  Not on file      Social History Narrative   .  No narrative on file     Review of systems: The patient denies significant weight loss and says his weight has been going up and down. He is shortness of breath with exertion. He denies chest pain.    Family History   Problem  Relation  Age of Onset   .  Stroke  Mother           Physical Examination     Filed Vitals:   01/07/14 1542  BP: 145/84  Pulse: 80  Height: 5\' 11"  (1.803 m)  Weight:  121 lb 9.6 oz (55.157 kg)  SpO2: 100%   General: A&O x 3, Thin male in NAD    Pulmonary: Sym exp, good air movt, CTAB, no rales, rhonchi, & wheezing  Cardiac: RRR, Nl S1, S2, no Murmurs, rubs or gallops, No carotid bruits     Assessment: I reviewed the patient's recent arteriogram. 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 His superior mesenteric artery bypass graft is widely patent. In order to improve flow through the celiac artery, he would require repair of a thoracoabdominal aneurysm. This would be a fairly morbid procedure. The only indication to do his thoracoabdominal aneurysm repair would be to improve perfusion to his stomach for an esophageal resection. His recovery from the thoracoabdominal aneurysm repair prior to the esophageal resection and gastric pullup would be extensive. In discussions with Dr. Servando Snare we believe at this point he is not a candidate for esophageal resection. He will continue medical therapy of his cancer for now. We will continue to follow him for his thoracoabdominal aneurysm. He will return to see [place] in 6 months with a CT Angio chest abdomen pelvis at that time.  Ruta Hinds, MD [facility] Office:  [phone] Pager: [phone]

## 2014-02-08 ENCOUNTER — Encounter: Payer: Self-pay | Admitting: Nutrition

## 2014-02-08 ENCOUNTER — Ambulatory Visit (HOSPITAL_BASED_OUTPATIENT_CLINIC_OR_DEPARTMENT_OTHER): Payer: 59 | Admitting: Nurse Practitioner

## 2014-02-08 ENCOUNTER — Telehealth: Payer: Self-pay | Admitting: Oncology

## 2014-02-08 VITALS — BP 156/86 | HR 91 | Temp 98.1°F | Resp 18 | Ht 71.0 in | Wt 120.8 lb

## 2014-02-08 DIAGNOSIS — I739 Peripheral vascular disease, unspecified: Secondary | ICD-10-CM

## 2014-02-08 DIAGNOSIS — C155 Malignant neoplasm of lower third of esophagus: Secondary | ICD-10-CM

## 2014-02-08 DIAGNOSIS — R1310 Dysphagia, unspecified: Secondary | ICD-10-CM

## 2014-02-08 DIAGNOSIS — R634 Abnormal weight loss: Secondary | ICD-10-CM

## 2014-02-08 NOTE — Progress Notes (Signed)
  [facility] OFFICE PROGRESS NOTE   Diagnosis:  Esophagus cancer.  INTERVAL HISTORY:   Brandon [name] returns as scheduled. He has mild intermittent odynophagia. He is tolerating all foods well except chicken. He experiences mild dysphagia with chicken. He denies nausea/vomiting. No constipation or diarrhea. He reports a good appetite.  Objective:  Vital signs in last 24 hours:  Blood pressure 156/86, pulse 91, temperature 98.1 F (36.7 C), temperature source Oral, resp. rate 18, height 5\' 11"  (1.803 m), weight 120 lb 12.8 oz (54.795 kg), SpO2 100.00%.    HEENT: No thrush or ulcers. Lymphatics: No palpable cervical, supraclavicular or axillary lymph nodes. Resp: Lungs clear bilaterally. Cardio: Regular rate and rhythm. GI: Abdomen soft and nontender. No hepatomegaly. Vascular: No leg edema.  Lab Results:  Lab Results  Component Value Date   WBC 2.7* 10/19/2013   HGB 13.6 11/25/2013   HCT 40.0 11/25/2013   MCV 96.5 10/19/2013   PLT 144 10/19/2013   NEUTROABS 1.8 10/19/2013    Imaging:  No results found.  Medications: I have reviewed the patient's current medications.  Assessment/Plan: 1. 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  EGD by Dr. Collene Mares on 08/26/2013 with findings of a malignant appearing friable mass causing stenosis at 33 cm from the incisors extending to the GE junction. Stomach and the proximal small bowel were not visualized.  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  Staging PET scan on 09/03/2013 showed a 6.5 cm segment of intense hypermetabolic activity associated with the mid to distal esophagus with SUV max 24.5. There was no evidence of mediastinal metastasis, gastrohepatic ligament nodal metastasis or liver metastasis.  Initiation of radiation 09/14/2013 and concurrent weekly Taxol/carboplatin on 09/15/2013. Cycle 5 Taxol/carboplatin 10/12/2013. Radiation completed  10/26/2013.  Restaging CTs 11/10/2013 with no evidence of metastatic disease and improvement in the esophagus wall thickening. 2. Dysphagia secondary to #1. Improved. 3. Weight loss secondary to #1.  4. Peripheral vascular disease. 5. History of mesenteric ischemia. 6. History of odynophagia secondary to radiation.    Disposition: Brandon [name] appears stable. He remains in clinical remission from esophagus cancer. He is not a candidate for esophageal resection. We will follow on an observation approach. He will return for a followup visit in 8 weeks. If he notes progressive dysphagia he will contact the office and we will refer him to GI.  Plan reviewed with Dr. Benay Spice.    Brandon [name] ANP/GNP-BC   02/08/2014  12:53 PM

## 2014-02-08 NOTE — Progress Notes (Signed)
Provided samples of Carnation VHC and Ensure Powder for patient to try.

## 2014-02-08 NOTE — Telephone Encounter (Signed)
gv and printed appt sched and avs for pt for OCT. °

## 2014-02-25 ENCOUNTER — Ambulatory Visit: Payer: 59 | Admitting: Cardiothoracic Surgery

## 2014-03-03 ENCOUNTER — Encounter: Payer: Self-pay | Admitting: Cardiothoracic Surgery

## 2014-03-03 ENCOUNTER — Ambulatory Visit (INDEPENDENT_AMBULATORY_CARE_PROVIDER_SITE_OTHER): Payer: 59 | Admitting: Cardiothoracic Surgery

## 2014-03-03 VITALS — BP 155/95 | HR 86 | Ht 71.0 in | Wt 124.0 lb

## 2014-03-03 DIAGNOSIS — C159 Malignant neoplasm of esophagus, unspecified: Secondary | ICD-10-CM

## 2014-03-03 NOTE — Progress Notes (Signed)
[facility] Record #[id number] Date of Birth: 12-14-1955  Referring: Marye Round, MD, & Dr Benay Spice Primary Care: No PCP Per Patient  Chief Complaint:    Chief Complaint  Patient presents with  . F/U THORACIC    8 WK F/U VISIT    History of Present Illness:    Brandon [name] 58 y.o. 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 The patient has completed course of radiation therapy and chemotherapy with good response. He notes that he is able to take solid food without difficulty with the exception of chicken. He notes that he had beef tips in right for lunch today, and has been eating a fairly regular diet   The patient's history is complicated by known peripheral vascular disease and mesenteric ischemia. In 2009 he underwent aortobifemoral bypass and bypass to the superior mesenteric artery. 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 The patient denies any mesenteric ischemic symptoms currently.  Do to his underlying vascular disease we decided not to proceed with surgical resection.  Wt Readings from Last 3 Encounters:  03/03/14 124 lb (56.246 kg)  02/08/14 120 lb 12.8 oz (54.795 kg)  01/07/14 121 lb 9.6 oz (55.157 kg)    Current Activity/ Functional Status:  Patient is independent with mobility/ambulation, transfers, ADL's, IADL's.   Zubrod Score: At the time of surgery this patient's most appropriate activity status/level should be described as: [x]     0    Normal activity, no symptoms []     1    Restricted in physical strenuous activity but ambulatory, able to do out light work []     2    Ambulatory and capable of self care, unable to do work activities, up and about               >50 % of waking hours                              []     3    Only limited self  care, in bed greater than 50% of waking hours []     4    Completely disabled, no self care, confined to bed or chair []     5    Moribund   Past Medical History  Diagnosis Date      . Cancer 08/26/13    esophageal   . ETOH abuse     Past Surgical History  Procedure Laterality Date  .         DATE OF PROCEDURE: 07/29/2007  OPERATIVE REPORT  PROCEDURE:  1. Aortobifemoral bypass.  2. Aorta to the superior mesenteric artery bypass.  PREOPERATIVE DIAGNOSES:  1. Claudication with early rest pain, right foot.  2. Chronic mesenteric ischemia.  POSTOPERATIVE DIAGNOSES:  1. Claudication with early rest pain, right foot.  2. Chronic mesenteric ischemia. BYPASS GRAFT EVALUATION  INDICATION: Followup, aortobifem and aorta-to-SMA bypass graft.  HISTORY:  Diabetes: No.  Cardiac: No.  Hypertension: Yes.  Smoking: Yes.  Previous Surgery: Aortobifem and aorta-to-SMA bypass grafts on  07/29/07.  SINGLE LEVEL ARTERIAL EXAM  RIGHT LEFT  Brachial: 168 172  Anterior  tibial: 164 148  Posterior tibial: 148 150  Peroneal:  Ankle/brachial index: 0.95 0.87  PREVIOUS ABI: Date: 08/20/07 RIGHT: 0.92 LEFT: 0.87  LOWER EXTREMITY BYPASS GRAFT DUPLEX EXAM:  DUPLEX:  1. Biphasic to monophasic waveforms noted throughout the aortobifem  bypass grafts and their native vessels with no evidence of  increased velocities noted.  2. Patent aorta-to-SMA bypass graft with no increase in velocities  noted.  3. Elevated velocity of 231 cm/s noted in the proximal celiac artery.  IMPRESSION:  1. Patent aortobifemoral and aorta-to-superior mesenteric artery  bypass grafts with no evidence of stenosis noted.  2. 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  3. Stable bilateral ankle brachial indices noted.  4. No significant change noted since the previous examinations on  08/13/07 and 08/20/07.  ___________________________________________  Jessy Oto. Fields, MD    CH/MEDQ D: 01/28/2008 T: 01/28/2008 Job: [id number]    Family History  Problem Relation Age of Onset  . Stroke Mother     History   Social History  . Marital Status: Divorced    Spouse Name: N/A    Number of Children: N/A  . Years of Education: N/A   Occupational History  . Not on file.   Social History Main Topics  . Smoking status: Current Every Day Smoker -- 1.00 packs/day for 40 years    Types: Cigarettes    Start date: 08/31/2013  . Smokeless tobacco: Never Used     Comment: down to 1 cigarette daily  . Alcohol Use: Yes     Comment: 12 beer weekly  since last 3 months  . Drug Use: No  . Sexual Activity: Not on file   Other Topics Concern  . Not on file   Social History Narrative  . No narrative on file    History  Smoking status  . Current Every Day Smoker -- 1.00 packs/day for 40 years  . Types: Cigarettes  . Start date: 08/31/2013  Smokeless tobacco  . Never Used    Comment: down to 1 cigarette daily    History  Alcohol Use  . Yes    Comment: 12 beer weekly  since last 3 months     No Known Allergies  Current Outpatient Prescriptions  Medication Sig Dispense Refill  . aspirin EC 81 MG tablet Take 81 mg by mouth daily.      Marland Kitchen emollient (BIAFINE) cream Apply 1 application topically daily.      Marland Kitchen HYDROcodone-acetaminophen (HYCET) 7.5-325 mg/15 ml solution Take 10-15 mLs by mouth 4 (four) times daily as needed for moderate pain.  500 mL  0  . ibuprofen (ADVIL,MOTRIN) 200 MG tablet Take 200 mg by mouth every 6 (six) hours as needed for mild pain.       Marland Kitchen lactose free nutrition (BOOST) LIQD Take 237 mLs by mouth 2 (two) times daily between meals.      . prochlorperazine (COMPAZINE) 10 MG tablet Take 10 mg by mouth every 6 (six) hours as needed for nausea or vomiting.      . sucralfate (CARAFATE) 1 G tablet Take 1 g by mouth 2 (two) times daily.       No current facility-administered medications for this visit.     Review of Systems:     Cardiac  Review of Systems: Y or N  Chest Pain [   n ]  Resting SOB [n] Exertional SOB  Blue.Reese  ]  Vertell Limber Florencio.Farrier  ]  Pedal Edema [n   ]    Palpitations Florencio.Farrier  ] Syncope  Florencio.Farrier  ]   Presyncope Florencio.Farrier   ]  General Review of Systems: [Y] = yes [  ]=no Constitional: recent weight change Minnie.Brome  ];  Wt loss over the last 3 months Aura.Spears   ] anorexia [  ]; fatigue [ y ]; nausea [  ]; night sweats [  ]; fever [  ]; or chills [  ];          Dental: poor dentition[ y]; Last Dentist visit:   Eye : blurred vision [  ]; diplopia [   ]; vision changes [  ];  Amaurosis fugax[  ]; Resp: cough [  ];  wheezing[  ];  hemoptysis[  ]; shortness of breath[  ]; paroxysmal nocturnal dyspnea[  ]; dyspnea on exertion[  ]; or orthopnea[  ];  GI:  gallstones[  ], vomiting[n  ];  dysphagia[ y ]; melena[  ];  hematochezia [  ]; heartburn[  ];   Hx of  Colonoscopy[ n ]; GU: kidney stones [  ]; hematuria[  ];   dysuria [  ];  nocturia[  ];  history of     obstruction [  ]; urinary frequency [  ]             Skin: rash, swelling[  ];, hair loss[  ];  peripheral edema[  ];  or itching[  ]; Musculosketetal: myalgias[  ];  joint swelling[  ];  joint erythema[  ];  joint pain[  ];  back pain[  ];  Heme/Lymph: bruising[  ];  bleeding[  ];  anemia[  ];  Neuro: TIA[  ];  headaches[  ];  stroke[  ];  vertigo[  ];  seizures[  ];   paresthesias[  ];  difficulty walking[ n ];  Psych:depression[  ]; anxiety[  ];  Endocrine: diabetes[ n ];  thyroid dysfunction[  ];  Immunizations: Flu up to date [ n ]; Pneumococcal up to date Florencio.Farrier  ];  Other:  Physical Exam: BP 155/95  Pulse 86  Ht 5\' 11"  (1.803 m)  Wt 124 lb (56.246 kg)  BMI 17.30 kg/m2  SpO2 95%  PHYSICAL EXAMINATION:  General appearance: alert, cooperative, appears older than stated age, cachectic and no distress Neurologic: intact Heart: regular rate and rhythm, S1, S2 normal, no murmur, click, rub or gallop Lungs: clear to auscultation bilaterally and normal percussion bilaterally Abdomen: soft,  non-tender; bowel sounds normal; no masses,  no organomegaly Extremities: extremities normal, atraumatic, no cyanosis or edema and Homans sign is negative, no sign of DVT Do not appreciate cervical or supraclavicular adenopathy no axillary adenopathy, no carotid bruits No abdominal bruit Patient has palpable DP 2+ bilateral and PT 1+ bilateral pulses  Diagnostic Studies & Laboratory data:     Recent Radiology Findings:  Ct Angio Abd/pel W/ And/or W/o  12/04/2013   CLINICAL DATA:  Prior aortic iliac bypass, abdominal vascular disease, mesenteric arterial insufficiency  EXAM: CTA ABDOMEN AND PELVIS WITH CONTRAST  TECHNIQUE: Multidetector CT imaging of the abdomen and pelvis was performed using the standard protocol during bolus administration of intravenous contrast. Multiplanar reconstructed images and MIPs were obtained and reviewed to evaluate the vascular anatomy.  CONTRAST:  66mL OMNIPAQUE IOHEXOL 350 MG/ML SOLN  COMPARISON:  11/10/2013  FINDINGS: Descending thoracic aorta at the diaphragmatic hiatus has a stable aneurysm measuring 5.2 x 5.1 cm. Displaced mural calcifications noted medially, suspect related to a  chronic focal dissection or intramural hemorrhage. No significant interval change. Mural thrombus remains. Patent aortic lumen.  Abdominal aorta demonstrates prior aorto bi-iliac reconstruction. Bypass graft appears patent. Patent aorta SMA bypass as well.  Celiac origin is occluded. The celiac branches are well reconstituted via SMA collateral pathways.  Native SMA origin demonstrates a severe stenosis blood remains patent (proximal to the mesenteric bypass conduit). Main renal arteries are patent. No definite accessory renal vasculature demonstrated.  Nonvascular: Hyperinflation of the lower lobes compatible with emphysema. Normal heart size. No pericardial or pleural effusion.  Liver, gallbladder, biliary system, pancreas, spleen, adrenal glands, and kidneys are within normal limits for age  and demonstrate no acute process. Mildly enlarged gastrohepatic ligament lymph node measuring 15 mm, image 17 series 8. Patient has a history of distal esophageal carcinoma. Nodal involvement not excluded.  Negative for bowel obstruction, dilatation, ileus, or free air.  No abdominal or pelvic free fluid, fluid collection, hemorrhage, abscess, or adenopathy.  No inguinal hernia.  Urinary bladder unremarkable.  No acute distal bowel process.  Degenerative changes of the spine and hips. No acute osseous finding.  Review of the MIP images confirms the above findings.  IMPRESSION: Patent aorto bi-iliac bypass graft.  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  Patent renal arteries  Stable descending thoracic aortic aneurysm measuring 5.2 x 5.1 cm, suspect chronic focal dissection versus prior intramural hemorrhage.  No acute intra-abdominal or pelvic process  Mild gastrohepatic ligament adenopathy, nodal involvement from known esophageal carcinoma not excluded.   Electronically Signed   By: Daryll Brod M.D.   On: 12/04/2013 13:01   Ct Chest W Contrast/Ct Abdomen Pelvis W Contrast  11/10/2013   CLINICAL DATA:  Esophageal cancer. Status post chemotherapy and radiation therapy.  EXAM: CT CHEST, ABDOMEN, AND PELVIS WITH CONTRAST  TECHNIQUE: Multidetector CT imaging of the chest, abdomen and pelvis was performed following the standard protocol during bolus administration of intravenous contrast.  CONTRAST:  111mL OMNIPAQUE IOHEXOL 300 MG/ML  SOLN  COMPARISON:  Head CT 09/03/2013.  FINDINGS: CT CHEST FINDINGS  The chest wall is unremarkable and stable. No supraclavicular or axillary mass or adenopathy. The thyroid gland is normal. The bony thorax is intact. No destructive bone lesions or spinal canal compromise.  The heart is normal in size. No pericardial effusion. No mediastinal or hilar mass or adenopathy. There is  tortuosity and moderate atherosclerotic calcifications involving the thoracic aorta. Coronary artery calcifications are noted. The esophagus is grossly normal. I do not see an obvious mass or surrounding adenopathy. Mild mid esophageal wall thickening is vastly improved when compared to the prior PET-CT. This would suggest a good response to treatment. No enlarged mediastinal or hilar lymph nodes.  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 This has enlarged since the study of 2009 and appears stable since the PET-CT.  The lungs are clear of acute process. No worrisome pulmonary nodules or masses. Emphysematous changes are again demonstrated. No pleural effusion.  CT ABDOMEN AND PELVIS FINDINGS  The solid abdominal organs are unremarkable. No findings for metastatic disease. Gallbladder is normal. No common bowel duct dilatation. No enlarged gastrohepatic ligament or the celiac axis lymph nodes. No mesenteric or retroperitoneal mass or adenopathy. Stable surgical changes involving the abdominal aorta with a aortoiliac bypass graft. No complicating features.  The stomach, duodenum, small bowel and  colon are unremarkable.  The bladder, prostate gland and seminal vesicles are unremarkable. No pelvic mass, adenopathy or free pelvic fluid collections. No inguinal mass or adenopathy.  The bony structures are unremarkable.  IMPRESSION: 1. Much improved appearance of the esophagus since the prior PET-CT suggesting a good response to therapy. Mild residual mid esophageal wall thickening is noted. No surrounding adenopathy. 2. Distal thoracic abdominal aortic aneurysm measuring 5.2 x 4.8 cm. This appears to be due to chronic dissection or IMH. 3. No findings for metastatic disease or adenopathy in the abdomen/pelvis. 4. Surgical changes related to aortoiliac bypass graft surgery. No complicating features.   Electronically Signed   By: Kalman Jewels  M.D.   On: 11/10/2013 16:06    09/03/2013   CLINICAL DATA:  Initial treatment strategy for esophageal mass.  EXAM: NUCLEAR MEDICINE PET SKULL BASE TO THIGH  TECHNIQUE: 5.7 mCi F-18 FDG was injected intravenously. Full-ring PET imaging was performed from the skull base to thigh after the radiotracer. CT data was obtained and used for attenuation correction and anatomic localization.  FASTING BLOOD GLUCOSE:  Value: 100 mg/dl  COMPARISON:  None.  FINDINGS: NECK  No hypermetabolic lymph nodes in the neck.  CHEST  There is a 6.5 cm segment of intense hypermetabolic activity associated with the mid to distal esophagus with SUV max equals 24.5. The activity initiates just above the carina and terminates several cm from the GE junction. No hypermetabolic mediastinal lymph nodes.  No suspicious or metabolic pulmonary nodules.  There is a precarinal lymph node measuring 13 mm (image 73) without associated metabolic activity  ABDOMEN/PELVIS  No abnormal hypermetabolic activity within the liver, pancreas, adrenal glands, or spleen. No hypermetabolic lymph nodes in the abdomen or pelvis.  SKELETON  No hypermetabolic gastrohepatic ligament lymph nodes. No abnormal metabolic activity within the liver. No hypermetabolic abdominal pelvic nodes.  As on note of a aortic graft repair.  IMPRESSION: 1. Long segment of intense hypermetabolic activity within the mid to distal esophagus consistent with esophageal carcinoma. 2. No evidence of mediastinal metastasis, gastrohepatic ligament nodal metastasis or liver metastasis. 3. No distant metastasis.   Electronically Signed   By: Suzy Bouchard M.D.   On: 09/03/2013 09:45    Recent Lab Findings: Lab Results  Component Value Date   WBC 2.7* 10/19/2013   HGB 13.6 11/25/2013   HCT 40.0 11/25/2013   PLT 144 10/19/2013   GLUCOSE 80 11/25/2013   CHOL  Value: 105        ATP III CLASSIFICATION:  <200     mg/dL   Desirable  200-239  mg/dL   Borderline High  >=240    mg/dL   High 07/28/2007     TRIG 59 08/04/2007   HDL 32* 07/27/2007   LDLCALC  Value: 91        Total Cholesterol/HDL:CHD Risk Coronary Heart Disease Risk Table                     Men   Women  1/2 Average Risk   3.4   3.3 07/19/2007   ALT 13 10/19/2013   AST 16 10/19/2013   NA 143 11/25/2013   K 3.9 11/25/2013   CL 103 11/25/2013   CREATININE 0.90 11/25/2013   BUN <3* 11/25/2013   CO2 25 10/19/2013   INR 1.1 07/29/2007   Surgical past from [facility] on [address]. 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 enlarged since 2009 Long-term alcohol and tobacco abuse-still smoking but decrease. With the patient's significant intra-abdominal vascular disease including probable occlusion of the celiac artery, occlusion of the SMA with a patent graft. Esophagectomy with reconstruction of the stomach would  carry unreasonable risk of gastric necrosis and severe complications in getting any beneficial effect of surgical resection after treatment with radiation and chemotherapy. After the arteriogram and CTA of the abdomen I discussed the vascular situation in the abdomen with Dr. Oneida Alar is arranging for a CTA of the abdomen.  Patient is doing well at this point following chemotherapy and radiation for his esophageal carcinoma,   take taken a regular diet I'll plan to see him back when necessary or at Dr. Gearldine Shown requested anytime  Grace Isaac MD [address] [place] [address] Office [phone]   Beeper [phone]    03/03/2014 4:11 PM

## 2014-03-04 ENCOUNTER — Ambulatory Visit: Payer: 59 | Admitting: Cardiothoracic Surgery

## 2014-03-11 ENCOUNTER — Encounter: Payer: Self-pay | Admitting: Radiation Oncology

## 2014-03-11 ENCOUNTER — Ambulatory Visit
Admission: RE | Admit: 2014-03-11 | Discharge: 2014-03-11 | Disposition: A | Discharge status: Home / Self Care | Payer: 59 | Source: Ambulatory Visit | Attending: Radiation Oncology | Admitting: Radiation Oncology

## 2014-03-11 VITALS — BP 178/92 | HR 91 | Temp 97.9°F | Resp 20 | Ht 71.0 in | Wt 124.5 lb

## 2014-03-11 DIAGNOSIS — C155 Malignant neoplasm of lower third of esophagus: Secondary | ICD-10-CM

## 2014-03-11 NOTE — Progress Notes (Signed)
  Radiation Oncology         [phone] ________________________________  Name: Brandon [name] MRN: [id number]  Date: 03/11/2014  DOB: 26-Jan-1956  Follow-Up Visit Note  CC: No PCP Per Patient  No ref. provider found  Diagnosis:   Adenocarcinoma of the distal esophagus/GE junction  Interval Since Last Radiation:  The patient completed radiation treatment on 10/26/2013   Narrative:  The patient returns today for routine follow-up.  The patient states he is doing fairly well at this time. He is able to swallow most everything that he lives with rare exceptions. No real complaints of dysphagia or odynophagia. No distant pain. No headaches or ongoing nausea.  The patient was seen by Dr. Servando Snare. Due to his comorbidities, it was felt that the patient was not a good surgical candidate.                              ALLERGIES:  has No Known Allergies.  Meds: Current Outpatient Prescriptions  Medication Sig Dispense Refill  . aspirin EC 81 MG tablet Take 81 mg by mouth daily.      . Aspirin-Acetaminophen-Caffeine (GOODY HEADACHE PO) Take 1 packet by mouth as needed (ffor back pain).       No current facility-administered medications for this encounter.    Physical Findings: The patient is in no acute distress. Patient is alert and oriented.  height is 5\' 11"  (1.803 m) and weight is 124 lb 8 oz (56.473 kg). His oral temperature is 97.9 F (36.6 C). His blood pressure is 178/92 and his pulse is 91. His respiration is 20. Marland Kitchen   General: Well-developed, in no acute distress HEENT: Normocephalic, atraumatic Cardiovascular: Regular rate and rhythm Respiratory: Clear to auscultation bilaterally GI: Soft, nontender, normal bowel sounds Extremities: No edema present   Lab Findings: Lab Results  Component Value Date   WBC 2.7* 10/19/2013   HGB 13.6 11/25/2013   HCT 40.0 11/25/2013   MCV 96.5 10/19/2013   PLT 144 10/19/2013     Radiographic Findings: No results found.  Impression:     The patient clinically is doing well. No new complaints today. The decision has been made not to proceed with surgery. He sees medical oncology later this month.  Plan:  The patient will followup in our clinic in 4 months.     Jodelle Gross, M.D., Ph.D.

## 2014-03-11 NOTE — Progress Notes (Signed)
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

## 2014-03-30 ENCOUNTER — Telehealth: Payer: Self-pay | Admitting: *Deleted

## 2014-03-30 NOTE — Telephone Encounter (Signed)
Has progressive difficulty swallowing solids. Liquids can be difficult at times. Water/thin liquids are still getting down. Will make MD aware, may need GI to see (Dr. Thalia Bloodgood agrees

## 2014-03-30 NOTE — Telephone Encounter (Signed)
Call to Dr. Lorie Apley office-faxed recent office notes as requested. Was instructed that Uziel needs to call the office himself to make the appointment. Notified Tagg and he agrees to call.

## 2014-04-05 ENCOUNTER — Ambulatory Visit (HOSPITAL_BASED_OUTPATIENT_CLINIC_OR_DEPARTMENT_OTHER): Payer: 59 | Admitting: Oncology

## 2014-04-05 ENCOUNTER — Telehealth: Payer: Self-pay | Admitting: Oncology

## 2014-04-05 ENCOUNTER — Other Ambulatory Visit: Payer: Self-pay | Admitting: *Deleted

## 2014-04-05 VITALS — BP 148/86 | HR 87 | Temp 98.8°F | Resp 20 | Ht 71.0 in | Wt 119.2 lb

## 2014-04-05 DIAGNOSIS — C155 Malignant neoplasm of lower third of esophagus: Secondary | ICD-10-CM

## 2014-04-05 DIAGNOSIS — Z8501 Personal history of malignant neoplasm of esophagus: Secondary | ICD-10-CM

## 2014-04-05 DIAGNOSIS — R1310 Dysphagia, unspecified: Secondary | ICD-10-CM

## 2014-04-05 MED ORDER — RANITIDINE HCL 150 MG PO TABS: 150.0000 mg | ORAL_TABLET | Freq: Two times a day (BID) | ORAL | Status: DC

## 2014-04-05 NOTE — Progress Notes (Signed)
  Brandon [name] developed increased dysphagia last week. He reports not eating for 5 days after food becoming "stuck "when he had [place]. He saw Dr. Collene Mares and reports undergoing an upper endoscopy 04/02/2014. He reports no tumor was found. He was placed on Carafate and Nexium and is scheduled to undergo an esophageal dilatation procedure. Brandon [name] started a Carafate but cannot afford the Nexium. He is now tolerating liquids.  Objective:  Vital signs in last 24 hours:  Blood pressure 148/86, pulse 87, temperature 98.8 F (37.1 C), temperature source Oral, resp. rate 20, height 5\' 11"  (1.803 m), weight 119 lb 3.2 oz (54.069 kg).    HEENT: Neck without mass Lymphatics: No cervical or supraclavicular nodes. "Shotty "bilateral axillary nodes. Resp: Distant breath sounds bilaterally, no respiratory distress Cardio: Regular rate and rhythm GI: No hepatosplenomegaly, no mass, mild tenderness in the left upper abdomen Vascular: No leg edema Skin: There is a flat 4-5 mm hyperpigmented slightly irregular mole at the left upper back  Assessment/Plan: 1. 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  EGD by Dr. Collene Mares on 08/26/2013 with findings of a malignant appearing friable mass causing stenosis at 33 cm from the incisors extending to the GE junction. Stomach and the proximal small bowel were not visualized.  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  Staging PET scan on 09/03/2013 showed a 6.5 cm segment of intense hypermetabolic activity associated with the mid to distal esophagus with SUV max 24.5. There was no evidence of mediastinal metastasis, gastrohepatic ligament nodal metastasis or liver metastasis.  Initiation of radiation 09/14/2013 and concurrent weekly Taxol/carboplatin on 09/15/2013. Cycle 5 Taxol/carboplatin  10/12/2013. Radiation completed 10/26/2013.  Restaging CTs 11/10/2013 with no evidence of metastatic disease and improvement in the esophagus wall thickening. 2. Dysphagia secondary to #1. Initially improved, recurrent dysphasia October 2015 3. Weight loss secondary to #1. Improved. 4. Peripheral vascular disease. 5. History of mesenteric ischemia. 6.   4-5 mm hyperpigmented mole at the left upper back   Disposition:  Brandon [name] is in clinical remission from esophagus cancer. He will continue followup with Dr. Collene Mares for management of the dysphagia. We prescribed Zantac since he cannot afford the Nexium. He will continue Carafate.  Brandon [name] will return for an office visit here in 3 months. He plans to discontinue smoking tomorrow. He declined an influenza vaccine.  Betsy Coder, MD  04/05/2014  1:08 PM

## 2014-04-05 NOTE — Telephone Encounter (Signed)
Gave AVS & cal for Jan.

## 2014-05-12 ENCOUNTER — Emergency Department (HOSPITAL_COMMUNITY): Payer: 59

## 2014-05-12 ENCOUNTER — Other Ambulatory Visit: Payer: Self-pay | Admitting: *Deleted

## 2014-05-12 ENCOUNTER — Other Ambulatory Visit: Payer: Self-pay | Admitting: Nurse Practitioner

## 2014-05-12 ENCOUNTER — Emergency Department (HOSPITAL_COMMUNITY)
Admission: EM | Admit: 2014-05-12 | Discharge: 2014-05-12 | Disposition: A | Discharge status: Home / Self Care | Payer: 59 | Attending: Emergency Medicine | Admitting: Emergency Medicine

## 2014-05-12 ENCOUNTER — Encounter (HOSPITAL_COMMUNITY): Payer: Self-pay | Admitting: *Deleted

## 2014-05-12 DIAGNOSIS — E876 Hypokalemia: Secondary | ICD-10-CM | POA: Diagnosis not present

## 2014-05-12 DIAGNOSIS — R1013 Epigastric pain: Secondary | ICD-10-CM | POA: Insufficient documentation

## 2014-05-12 DIAGNOSIS — Z79899 Other long term (current) drug therapy: Secondary | ICD-10-CM | POA: Insufficient documentation

## 2014-05-12 DIAGNOSIS — Z8719 Personal history of other diseases of the digestive system: Secondary | ICD-10-CM | POA: Insufficient documentation

## 2014-05-12 DIAGNOSIS — Z720 Tobacco use: Secondary | ICD-10-CM | POA: Diagnosis not present

## 2014-05-12 DIAGNOSIS — Z8501 Personal history of malignant neoplasm of esophagus: Secondary | ICD-10-CM | POA: Diagnosis not present

## 2014-05-12 DIAGNOSIS — R110 Nausea: Secondary | ICD-10-CM | POA: Insufficient documentation

## 2014-05-12 DIAGNOSIS — E860 Dehydration: Secondary | ICD-10-CM | POA: Diagnosis not present

## 2014-05-12 DIAGNOSIS — R109 Unspecified abdominal pain: Secondary | ICD-10-CM

## 2014-05-12 DIAGNOSIS — C155 Malignant neoplasm of lower third of esophagus: Secondary | ICD-10-CM

## 2014-05-12 DIAGNOSIS — Z923 Personal history of irradiation: Secondary | ICD-10-CM | POA: Diagnosis not present

## 2014-05-12 DIAGNOSIS — R52 Pain, unspecified: Secondary | ICD-10-CM

## 2014-05-12 LAB — COMPREHENSIVE METABOLIC PANEL
ALK PHOS: 111 U/L (ref 39–117)
ALT: 5 U/L (ref 0–53)
ANION GAP: 13 (ref 5–15)
AST: 14 U/L (ref 0–37)
Albumin: 2.9 g/dL — ABNORMAL LOW (ref 3.5–5.2)
BILIRUBIN TOTAL: 0.3 mg/dL (ref 0.3–1.2)
BUN: 8 mg/dL (ref 6–23)
CHLORIDE: 102 meq/L (ref 96–112)
CO2: 27 meq/L (ref 19–32)
CREATININE: 0.74 mg/dL (ref 0.50–1.35)
Calcium: 11.2 mg/dL — ABNORMAL HIGH (ref 8.4–10.5)
GFR calc Af Amer: >90 mL/min (ref >90)
Glucose, Bld: 91 mg/dL (ref 70–99)
POTASSIUM: 3.2 meq/L — AB (ref 3.7–5.3)
Sodium: 142 mEq/L (ref 137–147)
Total Protein: 7 g/dL (ref 6.0–8.3)

## 2014-05-12 LAB — CBC WITH DIFFERENTIAL/PLATELET
Basophils Absolute: 0 10*3/uL (ref 0.0–0.1)
Basophils Relative: 0 % (ref 0–1)
Eosinophils Absolute: 0.1 10*3/uL (ref 0.0–0.7)
Eosinophils Relative: 1 % (ref 0–5)
HEMATOCRIT: 43.2 % (ref 39.0–52.0)
HEMOGLOBIN: 14.6 g/dL (ref 13.0–17.0)
LYMPHS ABS: 1 10*3/uL (ref 0.7–4.0)
LYMPHS PCT: 10 % — AB (ref 12–46)
MCH: 33.6 pg (ref 26.0–34.0)
MCHC: 33.8 g/dL (ref 30.0–36.0)
MCV: 99.3 fL (ref 78.0–100.0)
MONO ABS: 0.9 10*3/uL (ref 0.1–1.0)
MONOS PCT: 9 % (ref 3–12)
NEUTROS ABS: 8.5 10*3/uL — AB (ref 1.7–7.7)
NEUTROS PCT: 80 % — AB (ref 43–77)
Platelets: 258 10*3/uL (ref 150–400)
RBC: 4.35 MIL/uL (ref 4.22–5.81)
RDW: 13.8 % (ref 11.5–15.5)
WBC: 10.5 10*3/uL (ref 4.0–10.5)

## 2014-05-12 MED ORDER — SODIUM CHLORIDE 0.9 % IV BOLUS (SEPSIS)
500.0000 mL | Freq: Once | INTRAVENOUS | Status: AC
  Administered 2014-05-12: 500 mL via INTRAVENOUS

## 2014-05-12 MED ORDER — POTASSIUM CHLORIDE CRYS ER 20 MEQ PO TBCR
40.0000 meq | EXTENDED_RELEASE_TABLET | Freq: Once | ORAL | Status: AC
  Administered 2014-05-12: 40 meq via ORAL
  Filled 2014-05-12: qty 2

## 2014-05-12 MED ORDER — IOHEXOL 300 MG/ML  SOLN
100.0000 mL | Freq: Once | INTRAMUSCULAR | Status: AC | PRN
  Administered 2014-05-12: 100 mL via INTRAVENOUS

## 2014-05-12 MED ORDER — IOHEXOL 300 MG/ML  SOLN
25.0000 mL | INTRAMUSCULAR | Status: AC
  Administered 2014-05-12 (×2): 25 mL via ORAL

## 2014-05-12 MED ORDER — TRAMADOL HCL 50 MG PO TABS: 50.0000 mg | ORAL_TABLET | Freq: Four times a day (QID) | ORAL | Status: DC | PRN

## 2014-05-12 MED ORDER — PROMETHAZINE HCL 25 MG PO TABS: 25.0000 mg | ORAL_TABLET | Freq: Four times a day (QID) | ORAL | Status: DC | PRN

## 2014-05-12 MED ORDER — SODIUM CHLORIDE 0.9 % IV BOLUS (SEPSIS)
1000.0000 mL | Freq: Once | INTRAVENOUS | Status: AC
  Administered 2014-05-12: 1000 mL via INTRAVENOUS

## 2014-05-12 NOTE — ED Notes (Signed)
MD Zammit at bedside. 

## 2014-05-12 NOTE — ED Notes (Signed)
Pt completed first cup of PO contrast. Pt will begin second at 1400.

## 2014-05-12 NOTE — ED Notes (Signed)
Pt reports upper abd pain x 1 week that radiates around to his back. Hx of esophageal cancer and having difficulty swallowing now. Reports constipation and fatigue.

## 2014-05-12 NOTE — ED Provider Notes (Signed)
CSN: [id number]     Arrival date & time 05/12/14  1056 History   First MD Initiated Contact with Patient 05/12/14 1108     Chief Complaint  Patient presents with  . Abdominal Pain     (Consider location/radiation/quality/duration/timing/severity/associated sxs/prior Treatment) Patient is a 58 y.o. male presenting with abdominal pain. The history is provided by the patient (The pt complains of decrease appetitie and nauseau.  the pt has a hx of esophageal ca).  Abdominal Pain Pain location:  Epigastric Pain quality: aching   Pain radiates to:  Does not radiate Pain severity:  Mild Onset quality:  Gradual Timing:  Constant Associated symptoms: nausea   Associated symptoms: no chest pain, no cough, no diarrhea, no fatigue and no hematuria     Past Medical History  Diagnosis Date  . Cancer 08/26/13    esophageal path pending  . ETOH abuse   . Tobacco abuse   . 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 History of aorta-iliac-femoral bypass   . History of radiation therapy 09/14/13-10/26/13    esophageal ca/50.4Gy/57fx   Past Surgical History  Procedure Laterality Date  . Cardiac surgery      femoral bypass surgery   Family History  Problem Relation Age of Onset  . Stroke Mother    History  Substance Use Topics  . Smoking status: Current Every Day Smoker -- 1.00 packs/day for 40 years    Types: Cigarettes    Start date: 08/31/2013  . Smokeless tobacco: Never Used     Comment: down to 1 cigarette daily  . Alcohol Use: Yes     Comment: 12 beer weekly  since last 3 months    Review of Systems  Constitutional: Negative for appetite change and fatigue.  HENT: Negative for congestion, ear discharge and sinus pressure.   Eyes: Negative for discharge.  Respiratory: Negative for cough.   Cardiovascular: Negative for chest pain.  Gastrointestinal: Positive for nausea and abdominal pain. Negative for diarrhea.  Genitourinary: Negative for  frequency and hematuria.  Musculoskeletal: Negative for back pain.  Skin: Negative for rash.  Neurological: Negative for seizures and headaches.  Psychiatric/Behavioral: Negative for hallucinations.      Allergies  Nsaids  Home Medications   Prior to Admission medications   Medication Sig Start Date End Date Taking? Authorizing Provider  CARAFATE 1 GM/10ML suspension Take 1 g by mouth 2 (two) times daily.  04/02/14  Yes Juanita Craver, MD  ranitidine (ZANTAC) 150 MG tablet Take 1 tablet (150 mg total) by mouth 2 (two) times daily. 04/05/14  Yes Ladell Pier, MD  promethazine (PHENERGAN) 25 MG tablet Take 1 tablet (25 mg total) by mouth every 6 (six) hours as needed for nausea or vomiting. 05/12/14   Maudry Diego, MD  traMADol (ULTRAM) 50 MG tablet Take 1 tablet (50 mg total) by mouth every 6 (six) hours as needed. 05/12/14   Maudry Diego, MD   BP 163/86 mmHg  Pulse 72  Temp(Src) 98.5 F (36.9 C) (Oral)  Resp 18  SpO2 100% Physical Exam  Constitutional: He is oriented to person, place, and time. He appears well-developed.  HENT:  Head: Normocephalic.  Eyes: Conjunctivae and EOM are normal. No scleral icterus.  Neck: Neck supple. No thyromegaly present.  Cardiovascular: Normal rate and regular rhythm.  Exam reveals no gallop and no friction rub.   No murmur heard. Pulmonary/Chest: No stridor. He has no wheezes. He has no rales.  He exhibits no tenderness.  Abdominal: He exhibits no distension. There is tenderness. There is no rebound.  Minimal tenderness to epigastric   Musculoskeletal: Normal range of motion. He exhibits no edema.  Lymphadenopathy:    He has no cervical adenopathy.  Neurological: He is oriented to person, place, and time. He exhibits normal muscle tone. Coordination normal.  Skin: No rash noted. No erythema.  Psychiatric: He has a normal mood and affect. His behavior is normal.    ED Course  Procedures (including critical care time) Labs Review Labs  Reviewed  CBC WITH DIFFERENTIAL - Abnormal; Notable for the following:    Neutrophils Relative % 80 (*)    Neutro Abs 8.5 (*)    Lymphocytes Relative 10 (*)    All other components within normal limits  COMPREHENSIVE METABOLIC PANEL - Abnormal; Notable for the following:    Potassium 3.2 (*)    Calcium 11.2 (*)    Albumin 2.9 (*)    All other components within normal limits    Imaging Review Dg Chest 2 View  05/12/2014   CLINICAL DATA:  Chest pain.  EXAM: CHEST  2 VIEW  COMPARISON:  July 30, 2007.  FINDINGS: The heart size and mediastinal contours are within normal limits. Both lungs are clear. Hyperexpansion of the lungs is noted consistent with chronic obstructive pulmonary disease. No pneumothorax or pleural effusion is noted. Minimally displaced fracture is seen involving the lateral portion of the right ninth rib.  IMPRESSION: Minimally displaced right ninth rib fracture. Findings consistent with chronic obstructive pulmonary disease. No acute cardiopulmonary abnormality seen.   Electronically Signed   By: Sabino Dick M.D.   On: 05/12/2014 13:08   Ct Abdomen Pelvis W Contrast  05/12/2014   CLINICAL DATA:  Upper abdominal pain for 1 week radiating into the back. History of esophageal cancer. Difficulty swallowing. Constipation and fatigue.  EXAM: CT ABDOMEN AND PELVIS WITH CONTRAST  TECHNIQUE: Multidetector CT imaging of the abdomen and pelvis was performed using the standard protocol following bolus administration of intravenous contrast.  CONTRAST:  100 mL OMNIPAQUE IOHEXOL 300 MG/ML  SOLN  COMPARISON:  CT abdomen and pelvis 12/04/2013 and CT chest, abdomen and pelvis 11/10/2013.  FINDINGS: The lung bases are clear. No pleural or pericardial effusion. Aneurysmal dilatation of the distal thoracic aorta which measures 5.6 x 5.1 cm is identified. On the most recent examination, it measured 5.3 x 5.1 cm.  There is new extensive lymphadenopathy in the abdomen. New extensive lymphadenopathy is  identified with multiple nodes demonstrating central low attenuation consistent with necrosis. Index node immediately anterior to the descending abdominal aorta in the upper abdomen on image 24 measures 3.9 x 2.3 cm compared to 1.4 cm on the most recent exam. A node posterior to the body of the pancreas on image 27 measures 1.6 x 2.3 cm. A second more inferior left para-aortic node measures 1.7 x 2.5 cm on image 30. Final index node in the left para-aortic station on image 38 measures 2.3 x 1.6 cm.  The gallbladder is decompressed but otherwise unremarkable. The liver, adrenal glands, kidneys and spleen appear normal. Again seen is postoperative change of aortic bi-iliac reconstruction with a patent bypass graft identified. The appearance is unchanged. No fluid collection is identified. There is a large volume of stool in the ascending and transverse colon. The colon is otherwise unremarkable. The appendix appears normal. The stomach and small bowel are unremarkable. No lytic or sclerotic bony lesion is identified.  IMPRESSION: Dominant  finding is new extensive lymphadenopathy in the upper abdomen most consistent with metastatic disease from esophageal carcinoma.  Distal thoracic aortic aneurysm measuring 5.6 x 5.1 cm appears slightly increased compared to the most recent examination.  Status post aorto bi-iliac bypass grafting, unchanged in appearance.   Electronically Signed   By: Inge Rise M.D.   On: 05/12/2014 15:20     EKG Interpretation None      MDM   Final diagnoses:  Pain  Abdominal pain in male    Pt with dehydration,  Hypokalemia,  Hypercalcemia,    I spoke with the oncologist and we decided to tx the pt with fluids and pain meds and the pt will be seen at the onc office Thursday or friday    Maudry Diego, MD 05/12/14 1627

## 2014-05-12 NOTE — ED Notes (Signed)
Pt finished drinking 2nd cup oral contrast. CT notified.

## 2014-05-12 NOTE — Discharge Instructions (Signed)
Drink plenty of fluids.  Dr. Ashok Cordia office will call you to set up an appointment tomorrow or Friday.   If you do not hear from them,  Then call them tomorrow and set up the appointment for Thursday or friday

## 2014-05-13 ENCOUNTER — Ambulatory Visit (HOSPITAL_BASED_OUTPATIENT_CLINIC_OR_DEPARTMENT_OTHER): Payer: 59 | Admitting: Nurse Practitioner

## 2014-05-13 ENCOUNTER — Ambulatory Visit: Payer: 59 | Admitting: Oncology

## 2014-05-13 ENCOUNTER — Other Ambulatory Visit (HOSPITAL_BASED_OUTPATIENT_CLINIC_OR_DEPARTMENT_OTHER): Payer: 59

## 2014-05-13 ENCOUNTER — Other Ambulatory Visit: Payer: Self-pay | Admitting: *Deleted

## 2014-05-13 ENCOUNTER — Telehealth: Payer: Self-pay | Admitting: Oncology

## 2014-05-13 ENCOUNTER — Telehealth: Payer: Self-pay | Admitting: Nurse Practitioner

## 2014-05-13 ENCOUNTER — Ambulatory Visit (HOSPITAL_BASED_OUTPATIENT_CLINIC_OR_DEPARTMENT_OTHER): Payer: 59

## 2014-05-13 ENCOUNTER — Ambulatory Visit: Payer: 59

## 2014-05-13 DIAGNOSIS — K5900 Constipation, unspecified: Secondary | ICD-10-CM

## 2014-05-13 DIAGNOSIS — R112 Nausea with vomiting, unspecified: Secondary | ICD-10-CM

## 2014-05-13 DIAGNOSIS — C155 Malignant neoplasm of lower third of esophagus: Secondary | ICD-10-CM

## 2014-05-13 DIAGNOSIS — G893 Neoplasm related pain (acute) (chronic): Secondary | ICD-10-CM

## 2014-05-13 DIAGNOSIS — Z598 Other problems related to housing and economic circumstances: Secondary | ICD-10-CM

## 2014-05-13 DIAGNOSIS — E860 Dehydration: Secondary | ICD-10-CM

## 2014-05-13 DIAGNOSIS — Z599 Problem related to housing and economic circumstances, unspecified: Secondary | ICD-10-CM

## 2014-05-13 DIAGNOSIS — R630 Anorexia: Secondary | ICD-10-CM

## 2014-05-13 DIAGNOSIS — E8809 Other disorders of plasma-protein metabolism, not elsewhere classified: Secondary | ICD-10-CM

## 2014-05-13 DIAGNOSIS — R531 Weakness: Secondary | ICD-10-CM

## 2014-05-13 LAB — CBC WITH DIFFERENTIAL/PLATELET
BASO%: 0.4 % (ref 0.0–2.0)
Basophils Absolute: 0.1 10*3/uL (ref 0.0–0.1)
EOS%: 0.8 % (ref 0.0–7.0)
Eosinophils Absolute: 0.1 10*3/uL (ref 0.0–0.5)
HCT: 42.8 % (ref 38.4–49.9)
HGB: 14.3 g/dL (ref 13.0–17.1)
LYMPH#: 1.1 10*3/uL (ref 0.9–3.3)
LYMPH%: 9.4 % — ABNORMAL LOW (ref 14.0–49.0)
MCH: 34 pg — ABNORMAL HIGH (ref 27.2–33.4)
MCHC: 33.4 g/dL (ref 32.0–36.0)
MCV: 101.9 fL — AB (ref 79.3–98.0)
MONO#: 0.9 10*3/uL (ref 0.1–0.9)
MONO%: 7.6 % (ref 0.0–14.0)
NEUT#: 9.2 10*3/uL — ABNORMAL HIGH (ref 1.5–6.5)
NEUT%: 81.8 % — AB (ref 39.0–75.0)
Platelets: 247 10*3/uL (ref 140–400)
RBC: 4.2 10*6/uL (ref 4.20–5.82)
RDW: 14 % (ref 11.0–14.6)
WBC: 11.2 10*3/uL — ABNORMAL HIGH (ref 4.0–10.3)

## 2014-05-13 LAB — COMPREHENSIVE METABOLIC PANEL (CC13)
ALT: 7 U/L (ref 0–55)
AST: 13 U/L (ref 5–34)
Albumin: 2.7 g/dL — ABNORMAL LOW (ref 3.5–5.0)
Alkaline Phosphatase: 107 U/L (ref 40–150)
Anion Gap: 10 mEq/L (ref 3–11)
BILIRUBIN TOTAL: 0.34 mg/dL (ref 0.20–1.20)
BUN: 6.7 mg/dL — ABNORMAL LOW (ref 7.0–26.0)
CALCIUM: 10.4 mg/dL (ref 8.4–10.4)
CHLORIDE: 107 meq/L (ref 98–109)
CO2: 24 mEq/L (ref 22–29)
Creatinine: 0.8 mg/dL (ref 0.7–1.3)
EGFR: >90 mL/min/{1.73_m2} (ref >90)
Glucose: 109 mg/dl (ref 70–140)
Potassium: 3.8 mEq/L (ref 3.5–5.1)
Sodium: 141 mEq/L (ref 136–145)
Total Protein: 6.4 g/dL (ref 6.4–8.3)

## 2014-05-13 MED ORDER — SODIUM CHLORIDE 0.9 % IV SOLN
INTRAVENOUS | Status: AC
  Administered 2014-05-13: 15:00:00 via INTRAVENOUS

## 2014-05-13 MED ORDER — PROCHLORPERAZINE MALEATE 10 MG PO TABS: 10.0000 mg | ORAL_TABLET | Freq: Four times a day (QID) | ORAL | Status: AC | PRN

## 2014-05-13 MED ORDER — LIDOCAINE-PRILOCAINE 2.5-2.5 % EX CREA: 1.0000 "application " | TOPICAL_CREAM | CUTANEOUS | Status: AC | PRN

## 2014-05-13 MED ORDER — ZOLEDRONIC ACID 4 MG/5ML IV CONC
4.0000 mg | Freq: Once | INTRAVENOUS | Status: AC
  Administered 2014-05-13: 4 mg via INTRAVENOUS
  Filled 2014-05-13: qty 5

## 2014-05-13 NOTE — Telephone Encounter (Signed)
per pof to sch pt appt-tlwd w/Melissa-adv i will consult w/CB on this pof

## 2014-05-13 NOTE — Telephone Encounter (Signed)
per pof to put on CB sym mgmt-cld 7 adv tanya 1:45 1st avail time-she stated ok and to leave zometa appt there

## 2014-05-13 NOTE — Telephone Encounter (Signed)
Lft msg w/lady she stated pt in hospital nurse just advised pt was released gave apt time for 12/03, called pt this morning he didn't get the msg spk w/nurse and advised pt to come in and we would check him in for labs and see about MD/chemo for today..... KJ

## 2014-05-13 NOTE — Patient Instructions (Signed)
Zoledronic Acid injection (Hypercalcemia, Oncology) What is this medicine? ZOLEDRONIC ACID (ZOE le dron ik AS id) lowers the amount of calcium loss from bone. It is used to treat too much calcium in your blood from cancer. It is also used to prevent complications of cancer that has spread to the bone. This medicine may be used for other purposes; ask your health care provider or pharmacist if you have questions. COMMON BRAND NAME(S): Zometa What should I tell my health care provider before I take this medicine? They need to know if you have any of these conditions: -aspirin-sensitive asthma -cancer, especially if you are receiving medicines used to treat cancer -dental disease or wear dentures -infection -kidney disease -receiving corticosteroids like dexamethasone or prednisone -an unusual or allergic reaction to zoledronic acid, other medicines, foods, dyes, or preservatives -pregnant or trying to get pregnant -breast-feeding How should I use this medicine? This medicine is for infusion into a vein. It is given by a health care professional in a hospital or clinic setting. Talk to your pediatrician regarding the use of this medicine in children. Special care may be needed. Overdosage: If you think you have taken too much of this medicine contact a poison control center or emergency room at once. NOTE: This medicine is only for you. Do not share this medicine with others. What if I miss a dose? It is important not to miss your dose. Call your doctor or health care professional if you are unable to keep an appointment. What may interact with this medicine? -certain antibiotics given by injection -NSAIDs, medicines for pain and inflammation, like ibuprofen or naproxen -some diuretics like bumetanide, furosemide -teriparatide -thalidomide This list may not describe all possible interactions. Give your health care provider a list of all the medicines, herbs, non-prescription drugs, or  dietary supplements you use. Also tell them if you smoke, drink alcohol, or use illegal drugs. Some items may interact with your medicine. What should I watch for while using this medicine? Visit your doctor or health care professional for regular checkups. It may be some time before you see the benefit from this medicine. Do not stop taking your medicine unless your doctor tells you to. Your doctor may order blood tests or other tests to see how you are doing. Women should inform their doctor if they wish to become pregnant or think they might be pregnant. There is a potential for serious side effects to an unborn child. Talk to your health care professional or pharmacist for more information. You should make sure that you get enough calcium and vitamin D while you are taking this medicine. Discuss the foods you eat and the vitamins you take with your health care professional. Some people who take this medicine have severe bone, joint, and/or muscle pain. This medicine may also increase your risk for jaw problems or a broken thigh bone. Tell your doctor right away if you have severe pain in your jaw, bones, joints, or muscles. Tell your doctor if you have any pain that does not go away or that gets worse. Tell your dentist and dental surgeon that you are taking this medicine. You should not have major dental surgery while on this medicine. See your dentist to have a dental exam and fix any dental problems before starting this medicine. Take good care of your teeth while on this medicine. Make sure you see your dentist for regular follow-up appointments. What side effects may I notice from receiving this medicine? Side effects that   you should report to your doctor or health care professional as soon as possible: -allergic reactions like skin rash, itching or hives, swelling of the face, lips, or tongue -anxiety, confusion, or depression -breathing problems -changes in vision -eye pain -feeling faint or  lightheaded, falls -jaw pain, especially after dental work -mouth sores -muscle cramps, stiffness, or weakness -trouble passing urine or change in the amount of urine Side effects that usually do not require medical attention (report to your doctor or health care professional if they continue or are bothersome): -bone, joint, or muscle pain -constipation -diarrhea -fever -hair loss -irritation at site where injected -loss of appetite -nausea, vomiting -stomach upset -trouble sleeping -trouble swallowing -weak or tired This list may not describe all possible side effects. Call your doctor for medical advice about side effects. You may report side effects to FDA at 1-800-FDA-1088. Where should I keep my medicine? This drug is given in a hospital or clinic and will not be stored at home. NOTE: This sheet is a summary. It may not cover all possible information. If you have questions about this medicine, talk to your doctor, pharmacist, or health care provider.  2015, Elsevier/Gold Standard. (2012-11-06 13:03:13) Dehydration, Adult Dehydration is when you lose more fluids from the body than you take in. Vital organs like the kidneys, brain, and heart cannot function without a proper amount of fluids and salt. Any loss of fluids from the body can cause dehydration.  CAUSES   Vomiting.  Diarrhea.  Excessive sweating.  Excessive urine output.  Fever. SYMPTOMS  Mild dehydration  Thirst.  Dry lips.  Slightly dry mouth. Moderate dehydration  Very dry mouth.  Sunken eyes.  Skin does not bounce back quickly when lightly pinched and released.  Dark urine and decreased urine production.  Decreased tear production.  Headache. Severe dehydration  Very dry mouth.  Extreme thirst.  Rapid, weak pulse (more than 100 beats per minute at rest).  Cold hands and feet.  Not able to sweat in spite of heat and temperature.  Rapid breathing.  Blue lips.  Confusion and  lethargy.  Difficulty being awakened.  Minimal urine production.  No tears. DIAGNOSIS  Your caregiver will diagnose dehydration based on your symptoms and your exam. Blood and urine tests will help confirm the diagnosis. The diagnostic evaluation should also identify the cause of dehydration. TREATMENT  Treatment of mild or moderate dehydration can often be done at home by increasing the amount of fluids that you drink. It is best to drink small amounts of fluid more often. Drinking too much at one time can make vomiting worse. Refer to the home care instructions below. Severe dehydration needs to be treated at the hospital where you will probably be given intravenous (IV) fluids that contain water and electrolytes. HOME CARE INSTRUCTIONS   Ask your caregiver about specific rehydration instructions.  Drink enough fluids to keep your urine clear or pale yellow.  Drink small amounts frequently if you have nausea and vomiting.  Eat as you normally do.  Avoid:  Foods or drinks high in sugar.  Carbonated drinks.  Juice.  Extremely hot or cold fluids.  Drinks with caffeine.  Fatty, greasy foods.  Alcohol.  Tobacco.  Overeating.  Gelatin desserts.  Wash your hands well to avoid spreading bacteria and viruses.  Only take over-the-counter or prescription medicines for pain, discomfort, or fever as directed by your caregiver.  Ask your caregiver if you should continue all prescribed and over-the-counter medicines.  Keep all   follow-up appointments with your caregiver. SEEK MEDICAL CARE IF:  You have abdominal pain and it increases or stays in one area (localizes).  You have a rash, stiff neck, or severe headache.  You are irritable, sleepy, or difficult to awaken.  You are weak, dizzy, or extremely thirsty. SEEK IMMEDIATE MEDICAL CARE IF:   You are unable to keep fluids down or you get worse despite treatment.  You have frequent episodes of vomiting or  diarrhea.  You have blood or green matter (bile) in your vomit.  You have blood in your stool or your stool looks black and tarry.  You have not urinated in 6 to 8 hours, or you have only urinated a small amount of very dark urine.  You have a fever.  You faint. MAKE SURE YOU:   Understand these instructions.  Will watch your condition.  Will get help right away if you are not doing well or get worse. Document Released: 05/28/2005 Document Revised: 08/20/2011 Document Reviewed: 01/15/2011 ExitCare Patient Information 2015 [employer]. This information is not intended to replace advice given to you by your health care provider. Make sure you discuss any questions you have with your health care provider.  

## 2014-05-14 ENCOUNTER — Encounter: Payer: Self-pay | Admitting: Nurse Practitioner

## 2014-05-14 ENCOUNTER — Telehealth: Payer: Self-pay | Admitting: *Deleted

## 2014-05-14 ENCOUNTER — Encounter: Payer: Self-pay | Admitting: *Deleted

## 2014-05-14 DIAGNOSIS — E8809 Other disorders of plasma-protein metabolism, not elsewhere classified: Secondary | ICD-10-CM | POA: Insufficient documentation

## 2014-05-14 DIAGNOSIS — Z598 Other problems related to housing and economic circumstances: Secondary | ICD-10-CM | POA: Insufficient documentation

## 2014-05-14 DIAGNOSIS — Z599 Problem related to housing and economic circumstances, unspecified: Secondary | ICD-10-CM | POA: Insufficient documentation

## 2014-05-14 DIAGNOSIS — K5900 Constipation, unspecified: Secondary | ICD-10-CM | POA: Insufficient documentation

## 2014-05-14 DIAGNOSIS — G893 Neoplasm related pain (acute) (chronic): Secondary | ICD-10-CM | POA: Insufficient documentation

## 2014-05-14 DIAGNOSIS — R531 Weakness: Secondary | ICD-10-CM | POA: Insufficient documentation

## 2014-05-14 DIAGNOSIS — E860 Dehydration: Secondary | ICD-10-CM | POA: Insufficient documentation

## 2014-05-14 DIAGNOSIS — R112 Nausea with vomiting, unspecified: Secondary | ICD-10-CM | POA: Insufficient documentation

## 2014-05-14 DIAGNOSIS — R630 Anorexia: Secondary | ICD-10-CM | POA: Insufficient documentation

## 2014-05-14 NOTE — Assessment & Plan Note (Addendum)
Pt continues to work a full time job; but is concerned regarding need to miss work for planned upcoming chemo txs. He also states that he cannot afford his prescription meds. Discussed pt's case with Financial counselor; and she advised that pt should bring in specific records to see if qualifies for financial assist from [facility].   Have also requested a social work referral for patient.  Hopefully, a Education officer, museum will be able to obtain transportation for the patient; as well as given additional support.

## 2014-05-14 NOTE — Assessment & Plan Note (Signed)
Patient is complaining of increasing generalized abdominal discomfort that radiates to his back.  He denies any specific flank pain.  He denies any dysuria.  Patient was given a prescription for tramadol while in the emergency department yesterday; he has not yet filled this prescription.    Did confirm that both the tramadol and ephedrine are less than $10 each; so hopefully patient will be a will to obtain these medications.  Patient also stated that he has plans to bring in his financial records and visit with the financial counselor in hopes of obtaining some financial assistance.

## 2014-05-14 NOTE — Assessment & Plan Note (Signed)
Pt did not c/o constipation; but CT obtained yesterday did reveal moderate constipation. Advised pt to take stool softeners twice daily; and take Miralax once daily.

## 2014-05-14 NOTE — Assessment & Plan Note (Addendum)
Pt is status post carboplatin/paclitaxel last received 10/12/2013.  He completed radiation tx 10/26/13.  CT scan obtained yesterday while in the ED revealed progression of disease.  After review of scan results with patient; decision was made to initiate FOLFOX chemotherapy regimen this coming Wednesday 05/19/2014.  Prior to initiating his new chemotherapy regimen-patient will need a Port-A-Cath placed.  He will also be given EMLA cream to apply to new Port-A-Cath site prior to Port-A-Cath access. Patient will have Port-A-Cath placement scheduled as soon as possible.

## 2014-05-14 NOTE — Assessment & Plan Note (Signed)
C/o continued dysphagia; and decreased appetite.  Pt is able to tolerate/swallow soft foods and liquids fairly well; with only occ choking. Have ordered nutrition referral.  Advised pt to try eating multiple small meals throughout day.

## 2014-05-14 NOTE — Progress Notes (Signed)
will   SYMPTOM MANAGEMENT [facility]IC   HPI: Brandon [name] 58 y.o. male diagnosed with esophageal cancer.  Patient is status post carboplatin/paclitaxel chemotherapy regimen which was completed in May 2015.  He completed his radiation treatments in May 2015 as well. He has been undergoing observation only since that time.  Patient presented to the emergency department yesterday with complaint of increased anorexia, nausea/vomiting, and generalized weakness.  He continues with some dysphasia with specific foods.  He states he does experience chronic nausea; and occasional vomiting.  He states he has not had any further esophageal dilatation per Dr. Collene Mares; but feels that he does need to followup with Dr. Collene Mares regarding a possible repeat dilatation.  Patient states that he feels fairly dehydrated today.  Patient was given IV fluid rehydration while in the emergency department yesterday.  He states it did help and feel slightly better.  He did obtain both a chest x-ray and a CT with contrast of the abdomen/pelvis while in the emergency department.  Unfortunately, CT results did reveal progression of his disease.  Patient presents today for review of his scan results; and was continued complaint of nausea/vomiting, anorexia, dehydration, and abdominal pain.  Patient reports that he was given both phenergran and tramadol prescriptions while in the emergency department; but he has not as of yet filled these prescriptions.   HPI  CURRENT THERAPY:  Days with orders from any treatment category:  No upcoming days in selected categories.    ROS  Past Medical History  Diagnosis Date  . Cancer 08/26/13    esophageal path pending  . ETOH abuse   . Tobacco abuse   . 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 History of aorta-iliac-femoral bypass   . History of radiation therapy 09/14/13-10/26/13    esophageal ca/50.4Gy/58f    Past Surgical History  Procedure Laterality  Date  . Cardiac surgery      femoral bypass surgery    has Malignant neoplasm of lower third of esophagus; S/P aorto-bifemoral bypass surgery; Chronic mesenteric arterial insufficiency syndrome; History of aorta-iliac-femoral bypass; ETOH abuse; Tobacco abuse; Thoracoabdominal aortic aneurysm, without rupture; Mesenteric artery insufficiency; AAA (abdominal aortic aneurysm) without rupture; Celiac artery stenosis; Hypercalcemia; Anorexia; Nausea with vomiting; Constipation; Weakness; Dehydration; Hypoalbuminemia; Financial difficulties; and Neoplasm related pain on his problem list.     is allergic to nsaids.    Medication List       This list is accurate as of: 05/13/14 11:59 PM.  Always use your most recent med list.               CARAFATE 1 GM/10ML suspension  Generic drug:  sucralfate  Take 1 g by mouth 2 (two) times daily.     lidocaine-prilocaine cream  Commonly known as:  EMLA  Apply 1 application topically as needed. Apply approx 30 minutes prior to accessing port.     prochlorperazine 10 MG tablet  Commonly known as:  COMPAZINE  Take 1 tablet (10 mg total) by mouth every 6 (six) hours as needed for nausea or vomiting.     promethazine 25 MG tablet  Commonly known as:  PHENERGAN  Take 1 tablet (25 mg total) by mouth every 6 (six) hours as needed for nausea or vomiting.     traMADol 50 MG tablet  Commonly known as:  ULTRAM  Take 1 tablet (50 mg total) by mouth every 6 (six) hours as needed.  PHYSICAL EXAMINATION  Blood pressure 128/79, pulse 98, temperature 98.1 F (36.7 C), temperature source Oral, resp. rate 18, height 5' 11"  (1.803 m), weight 113 lb 9.6 oz (51.529 kg).  Physical Exam  Constitutional: He is oriented to person, place, and time. Vital signs are normal. He appears dehydrated. He appears unhealthy. He appears cachectic.  HENT:  Head: Normocephalic and atraumatic.  Mouth/Throat: Oropharynx is clear and moist.  Patient is missing multiple  teeth  Eyes: Conjunctivae and EOM are normal. Pupils are equal, round, and reactive to light. Right eye exhibits no discharge. Left eye exhibits no discharge. No scleral icterus.  Neck: Normal range of motion. Neck supple. No JVD present. No tracheal deviation present. No thyromegaly present.  Cardiovascular: Normal rate, regular rhythm, normal heart sounds and intact distal pulses.   Pulmonary/Chest: Effort normal and breath sounds normal. No respiratory distress. He has no wheezes. He has no rales. He exhibits no tenderness.  Abdominal: Soft. Bowel sounds are normal. He exhibits no distension and no mass. There is tenderness. There is no rebound and no guarding.  Patient has some mild, generalized discomfort with palpation to entire abdominal area; with specific tenderness to left upper quadrant region.  Musculoskeletal: Normal range of motion. He exhibits no edema or tenderness.  Lymphadenopathy:    He has no cervical adenopathy.  Neurological: He is alert and oriented to person, place, and time. Gait normal.  Skin: Skin is warm and dry. No rash noted. No erythema.  Psychiatric: Affect normal.  Nursing note and vitals reviewed.   LABORATORY DATA:. Appointment on 05/13/2014  Component Date Value Ref Range Status  . WBC 05/13/2014 11.2* 4.0 - 10.3 10e3/uL Final  . NEUT# 05/13/2014 9.2* 1.5 - 6.5 10e3/uL Final  . HGB 05/13/2014 14.3  13.0 - 17.1 g/dL Final  . HCT 05/13/2014 42.8  38.4 - 49.9 % Final  . Platelets 05/13/2014 247  140 - 400 10e3/uL Final  . MCV 05/13/2014 101.9* 79.3 - 98.0 fL Final  . MCH 05/13/2014 34.0* 27.2 - 33.4 pg Final  . MCHC 05/13/2014 33.4  32.0 - 36.0 g/dL Final  . RBC 05/13/2014 4.20  4.20 - 5.82 10e6/uL Final  . RDW 05/13/2014 14.0  11.0 - 14.6 % Final  . lymph# 05/13/2014 1.1  0.9 - 3.3 10e3/uL Final  . MONO# 05/13/2014 0.9  0.1 - 0.9 10e3/uL Final  . Eosinophils Absolute 05/13/2014 0.1  0.0 - 0.5 10e3/uL Final  . Basophils Absolute 05/13/2014 0.1  0.0 -  0.1 10e3/uL Final  . NEUT% 05/13/2014 81.8* 39.0 - 75.0 % Final  . LYMPH% 05/13/2014 9.4* 14.0 - 49.0 % Final  . MONO% 05/13/2014 7.6  0.0 - 14.0 % Final  . EOS% 05/13/2014 0.8  0.0 - 7.0 % Final  . BASO% 05/13/2014 0.4  0.0 - 2.0 % Final  . Sodium 05/13/2014 141  136 - 145 mEq/L Final  . Potassium 05/13/2014 3.8  3.5 - 5.1 mEq/L Final  . Chloride 05/13/2014 107  98 - 109 mEq/L Final  . CO2 05/13/2014 24  22 - 29 mEq/L Final  . Glucose 05/13/2014 109  70 - 140 mg/dl Final  . BUN 05/13/2014 6.7* 7.0 - 26.0 mg/dL Final  . Creatinine 05/13/2014 0.8  0.7 - 1.3 mg/dL Final  . Total Bilirubin 05/13/2014 0.34  0.20 - 1.20 mg/dL Final  . Alkaline Phosphatase 05/13/2014 107  40 - 150 U/L Final  . AST 05/13/2014 13  5 - 34 U/L Final  . ALT 05/13/2014 7  0 - 55 U/L Final  . Total Protein 05/13/2014 6.4  6.4 - 8.3 g/dL Final  . Albumin 05/13/2014 2.7* 3.5 - 5.0 g/dL Final  . Calcium 05/13/2014 10.4  8.4 - 10.4 mg/dL Final  . Anion Gap 05/13/2014 10  3 - 11 mEq/L Final  . EGFR 05/13/2014 >90  >90 ml/min/1.73 m2 Final   eGFR is calculated using the CKD-EPI Creatinine Equation (2009)  Admission on 05/12/2014, Discharged on 05/12/2014  Component Date Value Ref Range Status  . WBC 05/12/2014 10.5  4.0 - 10.5 K/uL Final  . RBC 05/12/2014 4.35  4.22 - 5.81 MIL/uL Final  . Hemoglobin 05/12/2014 14.6  13.0 - 17.0 g/dL Final  . HCT 05/12/2014 43.2  39.0 - 52.0 % Final  . MCV 05/12/2014 99.3  78.0 - 100.0 fL Final  . MCH 05/12/2014 33.6  26.0 - 34.0 pg Final  . MCHC 05/12/2014 33.8  30.0 - 36.0 g/dL Final  . RDW 05/12/2014 13.8  11.5 - 15.5 % Final  . Platelets 05/12/2014 258  150 - 400 K/uL Final  . Neutrophils Relative % 05/12/2014 80* 43 - 77 % Final  . Neutro Abs 05/12/2014 8.5* 1.7 - 7.7 K/uL Final  . Lymphocytes Relative 05/12/2014 10* 12 - 46 % Final  . Lymphs Abs 05/12/2014 1.0  0.7 - 4.0 K/uL Final  . Monocytes Relative 05/12/2014 9  3 - 12 % Final  . Monocytes Absolute 05/12/2014 0.9  0.1 -  1.0 K/uL Final  . Eosinophils Relative 05/12/2014 1  0 - 5 % Final  . Eosinophils Absolute 05/12/2014 0.1  0.0 - 0.7 K/uL Final  . Basophils Relative 05/12/2014 0  0 - 1 % Final  . Basophils Absolute 05/12/2014 0.0  0.0 - 0.1 K/uL Final  . Sodium 05/12/2014 142  137 - 147 mEq/L Final  . Potassium 05/12/2014 3.2* 3.7 - 5.3 mEq/L Final  . Chloride 05/12/2014 102  96 - 112 mEq/L Final  . CO2 05/12/2014 27  19 - 32 mEq/L Final  . Glucose, Bld 05/12/2014 91  70 - 99 mg/dL Final  . BUN 05/12/2014 8  6 - 23 mg/dL Final  . Creatinine, Ser 05/12/2014 0.74  0.50 - 1.35 mg/dL Final  . Calcium 05/12/2014 11.2* 8.4 - 10.5 mg/dL Final  . Total Protein 05/12/2014 7.0  6.0 - 8.3 g/dL Final  . Albumin 05/12/2014 2.9* 3.5 - 5.2 g/dL Final  . AST 05/12/2014 14  0 - 37 U/L Final  . ALT 05/12/2014 5  0 - 53 U/L Final  . Alkaline Phosphatase 05/12/2014 111  39 - 117 U/L Final  . Total Bilirubin 05/12/2014 0.3  0.3 - 1.2 mg/dL Final  . GFR calc non Af Amer 05/12/2014 >90  >90 mL/min Final  . GFR calc Af Amer 05/12/2014 >90  >90 mL/min Final   Comment: (NOTE) The eGFR has been calculated using the CKD EPI equation. This calculation has not been validated in all clinical situations. eGFR's persistently <90 mL/min signify possible Chronic Kidney Disease.   . Anion gap 05/12/2014 13  5 - 15 Final     RADIOGRAPHIC STUDIES: Dg Chest 2 View  05/12/2014   CLINICAL DATA:  Chest pain.  EXAM: CHEST  2 VIEW  COMPARISON:  July 30, 2007.  FINDINGS: The heart size and mediastinal contours are within normal limits. Both lungs are clear. Hyperexpansion of the lungs is noted consistent with chronic obstructive pulmonary disease. No pneumothorax or pleural effusion is noted. Minimally displaced fracture is seen  involving the lateral portion of the right ninth rib.  IMPRESSION: Minimally displaced right ninth rib fracture. Findings consistent with chronic obstructive pulmonary disease. No acute cardiopulmonary abnormality  seen.   Electronically Signed   By: Sabino Dick M.D.   On: 05/12/2014 13:08   Ct Abdomen Pelvis W Contrast  05/12/2014   CLINICAL DATA:  Upper abdominal pain for 1 week radiating into the back. History of esophageal cancer. Difficulty swallowing. Constipation and fatigue.  EXAM: CT ABDOMEN AND PELVIS WITH CONTRAST  TECHNIQUE: Multidetector CT imaging of the abdomen and pelvis was performed using the standard protocol following bolus administration of intravenous contrast.  CONTRAST:  100 mL OMNIPAQUE IOHEXOL 300 MG/ML  SOLN  COMPARISON:  CT abdomen and pelvis 12/04/2013 and CT chest, abdomen and pelvis 11/10/2013.  FINDINGS: The lung bases are clear. No pleural or pericardial effusion. Aneurysmal dilatation of the distal thoracic aorta which measures 5.6 x 5.1 cm is identified. On the most recent examination, it measured 5.3 x 5.1 cm.  There is new extensive lymphadenopathy in the abdomen. New extensive lymphadenopathy is identified with multiple nodes demonstrating central low attenuation consistent with necrosis. Index node immediately anterior to the descending abdominal aorta in the upper abdomen on image 24 measures 3.9 x 2.3 cm compared to 1.4 cm on the most recent exam. A node posterior to the body of the pancreas on image 27 measures 1.6 x 2.3 cm. A second more inferior left para-aortic node measures 1.7 x 2.5 cm on image 30. Final index node in the left para-aortic station on image 38 measures 2.3 x 1.6 cm.  The gallbladder is decompressed but otherwise unremarkable. The liver, adrenal glands, kidneys and spleen appear normal. Again seen is postoperative change of aortic bi-iliac reconstruction with a patent bypass graft identified. The appearance is unchanged. No fluid collection is identified. There is a large volume of stool in the ascending and transverse colon. The colon is otherwise unremarkable. The appendix appears normal. The stomach and small bowel are unremarkable. No lytic or sclerotic bony  lesion is identified.  IMPRESSION: Dominant finding is new extensive lymphadenopathy in the upper abdomen most consistent with metastatic disease from esophageal carcinoma.  Distal thoracic aortic aneurysm measuring 5.6 x 5.1 cm appears slightly increased compared to the most recent examination.  Status post aorto bi-iliac bypass grafting, unchanged in appearance.   Electronically Signed   By: Inge Rise M.D.   On: 05/12/2014 15:20    ASSESSMENT/PLAN:    Anorexia C/o continued dysphagia; and decreased appetite.  Pt is able to tolerate/swallow soft foods and liquids fairly well; with only occ choking. Have ordered nutrition referral.  Advised pt to try eating multiple small meals throughout day.    Constipation Pt did not c/o constipation; but CT obtained yesterday did reveal moderate constipation. Advised pt to take stool softeners twice daily; and take Miralax once daily.    Dehydration C/o weakness and mild dizziness most likely secondary to dehydration.  Patient admittedly is taking in very little oral intake due to dysphagia.  Pt received 1 liter NS yesterday while in the ED;and will receive additional 1 liter NS IV fluids today.  He may very well need additional IV fluid rehydration next week as well.   Financial difficulties Pt continues to work a full time job; but is concerned regarding need to miss work for planned upcoming chemo txs. He also states that he cannot afford his prescription meds. Discussed pt's case with Development worker, community; and she advised that  pt should bring in specific records to see if qualifies for financial assist from [facility].   Have also requested a social work referral for patient.  Hopefully, a Education officer, museum will be able to obtain transportation for the patient; as well as given additional support.  Hypercalcemia Corrected calcium was 11.1 today. Most likely, elevated calcium is related to progression of disease; and possibly due to dehydration as  well.  Pt given Zometa 4 mg IV today.  Will continue to monitor closely.   Hypoalbuminemia Albumin is decreased today.  Pt encouraged to push protein as much as possible.   Malignant neoplasm of lower third of esophagus Pt is status post carboplatin/paclitaxel last received 10/12/2013.  He completed radiation tx 10/26/13.  CT scan obtained yesterday while in the ED revealed progression of disease.  After review of scan results with patient; decision was made to initiate FOLFOX chemotherapy regimen this coming Wednesday 05/19/2014.  Prior to initiating his new chemotherapy regimen-patient will need a Port-A-Cath placed.  He will also be given EMLA cream to apply to new Port-A-Cath site prior to Port-A-Cath access. Patient will have Port-A-Cath placement scheduled as soon as possible.  Nausea with vomiting Patient continues to complainof intermittent nausea; and occasional vomiting.  He states that he continues with mild dysphasia with specific foods he eats.  He states that the food did walk in his lower esophagus; and he has to vomit to clear it.  Patient has had his esophageal stricture dilated in the past per Dr. Collene Mares; and advised that patient follow up again with Dr. Collene Mares for possible esophageal dilatation.  Will assist patient in obtaining this appointment.  Patient was given Phenergan for scription while in the emergency department for his chronic nausea/vomiting.  He has yet to fill this prescription however.  Neoplasm related pain Patient is complaining of increasing generalized abdominal discomfort that radiates to his back.  He denies any specific flank pain.  He denies any dysuria.  Patient was given a prescription for tramadol while in the emergency department yesterday; he has not yet filled this prescription.    Did confirm that both the tramadol and ephedrine are less than $10 each; so hopefully patient will be a will to obtain these medications.  Patient also stated that he has plans to  bring in his financial records and visit with the financial counselor in hopes of obtaining some financial assistance.  Weakness Patient is complaining of progressive weakness this past week or so.  Most likely, this is secondary to cancer progression, anorexia, dehydration; and perhaps hyper-calcium.  Will administer Zometa to correct hypercalcemia today.  Patient will receive IV fluid rehydration today as well.  Patient has plans to meet with the nutritionist fairly soon as well for additional instructions regarding future nutrition.   Patient stated understanding of all instructions; and was in agreement with this plan of care. The patient knows to call the clinic with any problems, questions or concerns.   This was a shared visit with Dr. Benay Spice today.   Total time spent with patient was 40 minutes;  with greater than 80 percent of that time spent in face to face counseling regarding his symptoms, and coordination of care and follow up.  Disclaimer: This note was dictated with voice recognition software. Similar sounding words can inadvertently be transcribed and may not be corrected upon review.   Drue Second, NP 05/14/2014   This was a shared visit with Drue Second. Mr. Harvill has hypercalcemia. He will be  treated with intravenous fluids and biphosphonate therapy today.  The CT from 05/12/2014 is consistent with recurrent esophagus cancer. I discussed the CT findings and treatment options with Mr. Buzby. I recommend placement of a Port-A-Cath and FOLFOX chemotherapy. He will be scheduled for a first cycle FOLFOX and an office visit on 05/19/2014.  Julieanne Manson, M.D.

## 2014-05-14 NOTE — Telephone Encounter (Signed)
Call from [place] in IR: Pt declined port placement appointment. Called pt, he had several questions about the port. Stated he got a lot of information all at once and was confused. Explained rationale and what to expect about port a cath and reinforced teaching about the continuous infusion pump. Pt voiced understanding and appreciation for call. He reports his main concern is that he does not have anyone to drive him home after procedure. Will discuss with social work to find any available resources.

## 2014-05-14 NOTE — Assessment & Plan Note (Signed)
Corrected calcium was 11.1 today. Most likely, elevated calcium is related to progression of disease; and possibly due to dehydration as well.  Pt given Zometa 4 mg IV today.  Will continue to monitor closely.

## 2014-05-14 NOTE — Assessment & Plan Note (Signed)
Patient is complaining of progressive weakness this past week or so.  Most likely, this is secondary to cancer progression, anorexia, dehydration; and perhaps hyper-calcium.  Will administer Zometa to correct hypercalcemia today.  Patient will receive IV fluid rehydration today as well.  Patient has plans to meet with the nutritionist fairly soon as well for additional instructions regarding future nutrition.

## 2014-05-14 NOTE — Assessment & Plan Note (Signed)
Patient continues to complainof intermittent nausea; and occasional vomiting.  He states that he continues with mild dysphasia with specific foods he eats.  He states that the food did walk in his lower esophagus; and he has to vomit to clear it.  Patient has had his esophageal stricture dilated in the past per Dr. Collene Mares; and advised that patient follow up again with Dr. Collene Mares for possible esophageal dilatation.  Will assist patient in obtaining this appointment.  Patient was given Phenergan for scription while in the emergency department for his chronic nausea/vomiting.  He has yet to fill this prescription however.

## 2014-05-14 NOTE — Assessment & Plan Note (Signed)
Albumin is decreased today.  Pt encouraged to push protein as much as possible.

## 2014-05-14 NOTE — Progress Notes (Signed)
[place] Work  Clinical Social Work was referred by nurse for assessment of psychosocial needs due to transportation assistance.  Clinical Social Worker contacted patient at home and left vm in order to reach out and attempt to establish relationship to offer support, assess for needs and assist with transportation.  CSW awaits return call and will assist accordingly.   Loren Racer, [place] Worker [place]  [place] Phone: [phone] Fax: [phone]

## 2014-05-14 NOTE — Assessment & Plan Note (Signed)
C/o weakness and mild dizziness most likely secondary to dehydration.  Patient admittedly is taking in very little oral intake due to dysphagia.  Pt received 1 liter NS yesterday while in the ED;and will receive additional 1 liter NS IV fluids today.  He may very well need additional IV fluid rehydration next week as well.

## 2014-05-18 ENCOUNTER — Telehealth: Payer: Self-pay | Admitting: *Deleted

## 2014-05-18 ENCOUNTER — Ambulatory Visit (HOSPITAL_COMMUNITY): Payer: 59

## 2014-05-18 ENCOUNTER — Telehealth: Payer: Self-pay | Admitting: Oncology

## 2014-05-18 ENCOUNTER — Other Ambulatory Visit: Payer: Self-pay | Admitting: Oncology

## 2014-05-18 ENCOUNTER — Other Ambulatory Visit: Payer: Self-pay | Admitting: *Deleted

## 2014-05-18 ENCOUNTER — Other Ambulatory Visit (HOSPITAL_COMMUNITY): Payer: 59

## 2014-05-18 DIAGNOSIS — C155 Malignant neoplasm of lower third of esophagus: Secondary | ICD-10-CM

## 2014-05-18 NOTE — Telephone Encounter (Signed)
S/w pt confirming next visit and to come to scheduling to p/u new schedule.... KJ

## 2014-05-18 NOTE — Telephone Encounter (Signed)
Per staff message and POF I have scheduled appts. Advised scheduler of appts, and no available on 12/21 moved to 12/22 JMW

## 2014-05-19 ENCOUNTER — Ambulatory Visit: Payer: 59

## 2014-05-19 ENCOUNTER — Encounter: Payer: Self-pay | Admitting: Oncology

## 2014-05-19 ENCOUNTER — Ambulatory Visit (HOSPITAL_BASED_OUTPATIENT_CLINIC_OR_DEPARTMENT_OTHER): Payer: 59 | Admitting: Nurse Practitioner

## 2014-05-19 ENCOUNTER — Other Ambulatory Visit (HOSPITAL_BASED_OUTPATIENT_CLINIC_OR_DEPARTMENT_OTHER): Payer: 59

## 2014-05-19 ENCOUNTER — Encounter: Payer: Self-pay | Admitting: *Deleted

## 2014-05-19 ENCOUNTER — Ambulatory Visit (HOSPITAL_BASED_OUTPATIENT_CLINIC_OR_DEPARTMENT_OTHER): Payer: 59

## 2014-05-19 VITALS — BP 123/85 | HR 98 | Temp 98.3°F | Resp 17 | Ht 71.0 in | Wt 112.0 lb

## 2014-05-19 DIAGNOSIS — C155 Malignant neoplasm of lower third of esophagus: Secondary | ICD-10-CM

## 2014-05-19 DIAGNOSIS — E860 Dehydration: Secondary | ICD-10-CM

## 2014-05-19 DIAGNOSIS — R630 Anorexia: Secondary | ICD-10-CM

## 2014-05-19 DIAGNOSIS — R1310 Dysphagia, unspecified: Secondary | ICD-10-CM

## 2014-05-19 DIAGNOSIS — E8809 Other disorders of plasma-protein metabolism, not elsewhere classified: Secondary | ICD-10-CM

## 2014-05-19 LAB — COMPREHENSIVE METABOLIC PANEL (CC13)
ALK PHOS: 111 U/L (ref 40–150)
ALT: 8 U/L (ref 0–55)
ANION GAP: 11 meq/L (ref 3–11)
AST: 16 U/L (ref 5–34)
Albumin: 2.8 g/dL — ABNORMAL LOW (ref 3.5–5.0)
BUN: 5.3 mg/dL — AB (ref 7.0–26.0)
CALCIUM: 9 mg/dL (ref 8.4–10.4)
CHLORIDE: 107 meq/L (ref 98–109)
CO2: 21 meq/L — AB (ref 22–29)
Creatinine: 0.6 mg/dL — ABNORMAL LOW (ref 0.7–1.3)
Glucose: 112 mg/dl (ref 70–140)
POTASSIUM: 3.7 meq/L (ref 3.5–5.1)
SODIUM: 139 meq/L (ref 136–145)
TOTAL PROTEIN: 6.7 g/dL (ref 6.4–8.3)
Total Bilirubin: 0.41 mg/dL (ref 0.20–1.20)

## 2014-05-19 MED ORDER — SODIUM CHLORIDE 0.9 % IV SOLN
1000.0000 mL | Freq: Once | INTRAVENOUS | Status: AC
  Administered 2014-05-19: 1000 mL via INTRAVENOUS

## 2014-05-19 NOTE — Patient Instructions (Signed)
Dehydration, Adult Dehydration is when you lose more fluids from the body than you take in. Vital organs like the kidneys, brain, and heart cannot function without a proper amount of fluids and salt. Any loss of fluids from the body can cause dehydration.  CAUSES   Vomiting.  Diarrhea.  Excessive sweating.  Excessive urine output.  Fever. SYMPTOMS  Mild dehydration  Thirst.  Dry lips.  Slightly dry mouth. Moderate dehydration  Very dry mouth.  Sunken eyes.  Skin does not bounce back quickly when lightly pinched and released.  Dark urine and decreased urine production.  Decreased tear production.  Headache. Severe dehydration  Very dry mouth.  Extreme thirst.  Rapid, weak pulse (more than 100 beats per minute at rest).  Cold hands and feet.  Not able to sweat in spite of heat and temperature.  Rapid breathing.  Blue lips.  Confusion and lethargy.  Difficulty being awakened.  Minimal urine production.  No tears. DIAGNOSIS  Your caregiver will diagnose dehydration based on your symptoms and your exam. Blood and urine tests will help confirm the diagnosis. The diagnostic evaluation should also identify the cause of dehydration. TREATMENT  Treatment of mild or moderate dehydration can often be done at home by increasing the amount of fluids that you drink. It is best to drink small amounts of fluid more often. Drinking too much at one time can make vomiting worse. Refer to the home care instructions below. Severe dehydration needs to be treated at the hospital where you will probably be given intravenous (IV) fluids that contain water and electrolytes. HOME CARE INSTRUCTIONS   Ask your caregiver about specific rehydration instructions.  Drink enough fluids to keep your urine clear or pale yellow.  Drink small amounts frequently if you have nausea and vomiting.  Eat as you normally do.  Avoid:  Foods or drinks high in sugar.  Carbonated  drinks.  Juice.  Extremely hot or cold fluids.  Drinks with caffeine.  Fatty, greasy foods.  Alcohol.  Tobacco.  Overeating.  Gelatin desserts.  Wash your hands well to avoid spreading bacteria and viruses.  Only take over-the-counter or prescription medicines for pain, discomfort, or fever as directed by your caregiver.  Ask your caregiver if you should continue all prescribed and over-the-counter medicines.  Keep all follow-up appointments with your caregiver. SEEK MEDICAL CARE IF:  You have abdominal pain and it increases or stays in one area (localizes).  You have a rash, stiff neck, or severe headache.  You are irritable, sleepy, or difficult to awaken.  You are weak, dizzy, or extremely thirsty. SEEK IMMEDIATE MEDICAL CARE IF:   You are unable to keep fluids down or you get worse despite treatment.  You have frequent episodes of vomiting or diarrhea.  You have blood or green matter (bile) in your vomit.  You have blood in your stool or your stool looks black and tarry.  You have not urinated in 6 to 8 hours, or you have only urinated a small amount of very dark urine.  You have a fever.  You faint. MAKE SURE YOU:   Understand these instructions.  Will watch your condition.  Will get help right away if you are not doing well or get worse. Document Released: 05/28/2005 Document Revised: 08/20/2011 Document Reviewed: 01/15/2011 Exit[employer] Patient Information 2015 [employer]. This information is not intended to replace advice given to you by your health care provider. Make sure you discuss any questions you have with your health care   provider.  

## 2014-05-19 NOTE — Progress Notes (Signed)
Checked in new pt with no financial concerns. °

## 2014-05-19 NOTE — Progress Notes (Signed)
Pt is approved for the $400 [facility] grant.  °

## 2014-05-19 NOTE — Progress Notes (Signed)
[place] Work  Clinical Social Work was referred by nurse for assessment of psychosocial needs due to transportation concerns.  Clinical Social Worker has spoken with pt on several occasions about his transportation. Pt drives and has a car to get to appointments. Pt was aware he could not drive after his port placement and as of Friday he had made arrangements to have a friend bring him today for appointment. He was aware to contact CSW on Monday if there was a change in this plan.  CSW phoned pt today as was told there was an issue with his transportation. Per pt, the appointment was changed and now he is NOT aware of the new appointment date or time. Pt reports he does have transportation on hold for this next appointment. CSW discussed alternative transportation options with him as well. Pt reports he was able to drive through his last chemo, but is aware and agrees to assistance on port placement day. He is agreeable to contact CSW if needs additional assistance and was appreciative of the support.  Clinical Social Work interventions: Resource education  Loren Racer, [place] Worker [place]  [place] Phone: [phone] Fax: [phone]

## 2014-05-19 NOTE — Progress Notes (Signed)
Infusion at 1150

## 2014-05-20 ENCOUNTER — Encounter (HOSPITAL_COMMUNITY): Payer: Self-pay | Admitting: Surgery

## 2014-05-20 ENCOUNTER — Other Ambulatory Visit: Payer: Self-pay | Admitting: Nurse Practitioner

## 2014-05-20 DIAGNOSIS — R1310 Dysphagia, unspecified: Secondary | ICD-10-CM | POA: Insufficient documentation

## 2014-05-20 NOTE — Assessment & Plan Note (Signed)
Pt is status post carboplatin/paclitaxel last received 10/12/2013.  He completed radiation tx 10/26/13.  CT scan obtained recently while in the ED revealed progression of disease.  After review of scan results with patient; decision was made to initiate FOLFOX chemotherapy regimen this coming Wednesday 05/31/2014.  Prior to initiating his new chemotherapy regimen-patient will need a Port-A-Cath placed.  He was also given EMLA cream to apply to new Port-A-Cath site prior to Port-A-Cath access. Patient will have Port-A-Cath placement on 05/27/14.

## 2014-05-20 NOTE — Progress Notes (Addendum)
will   SYMPTOM MANAGEMENT CLINIC   HPI: Brandon [name] 58 y.o. male diagnosed with esophageal cancer.  Patient is status post carboplatin/paclitaxel chemotherapy regimen which was completed in May 2015.  He completed radiation treatments in May 2015 as well.  He has been undergoing active observation only since that time.  Patient presented to the emergency department approximately one week ago with complaint of increased anorexia, nausea/vomiting, and generalized weakness.  He was experiencing worsening dysphagia with specific foods.  He feels he has lost a good deal of weight recently as well.  Patient has had esophageal dilatation in the past per Dr. Tammi Klippel gastroenterologist; and patient feels he may very well need a repeat dilatation.  Patient feels fairly dehydrated today.  He received IV fluid rehydration last week as well; which did help him feel slightly better.  Patient states that he is unable to have Port-A-Cath insertion until 05/27/2014.  We will therefore have to postpone initiation of his FOLFOX chemotherapy regimen until 06/01/2014.  Also, patient has spoken with our [place] worker in regards to financial and transportation issues.  He states he has not been able to meet with the nutritionist yet.  He did also speak with the financial counselor; and is brought in his financial records per request.   HPI  CURRENT THERAPY: Upcoming Treatment Dates - COLORECTAL FOLFOX q14d Days with orders from any treatment category:  05/19/2014      SCHEDULING COMMUNICATION      ondansetron (ZOFRAN) IVPB 8 mg      dexamethasone (DECADRON) injection 10 mg      oxaliplatin (ELOXATIN) 135 mg in dextrose 5 % 500 mL chemo infusion      leucovorin 644 mg in dextrose 5 % 250 mL infusion      fluorouracil (ADRUCIL) chemo injection 650 mg      fluorouracil (ADRUCIL) 3,850 mg in sodium chloride 0.9 % 150 mL chemo infusion      sodium chloride 0.9 % injection 10 mL      heparin lock  flush 100 unit/mL      heparin lock flush 100 unit/mL      alteplase (CATHFLO ACTIVASE) injection 2 mg      sodium chloride 0.9 % injection 3 mL      Cold Pack 1 packet      Hot Pack 1 packet      dextrose 5 % solution      TREATMENT CONDITIONS 05/21/2014      SCHEDULING COMMUNICATION      sodium chloride 0.9 % injection 10 mL      heparin lock flush 100 unit/mL      heparin lock flush 100 unit/mL      alteplase (CATHFLO ACTIVASE) injection 2 mg      sodium chloride 0.9 % injection 3 mL      Cold Pack 1 packet 06/02/2014      SCHEDULING COMMUNICATION      ondansetron (ZOFRAN) IVPB 8 mg      dexamethasone (DECADRON) injection 10 mg      oxaliplatin (ELOXATIN) 135 mg in dextrose 5 % 500 mL chemo infusion      leucovorin 644 mg in dextrose 5 % 250 mL infusion      fluorouracil (ADRUCIL) chemo injection 650 mg      fluorouracil (ADRUCIL) 3,850 mg in sodium chloride 0.9 % 150 mL chemo infusion      sodium chloride 0.9 % injection 10 mL  heparin lock flush 100 unit/mL      heparin lock flush 100 unit/mL      alteplase (CATHFLO ACTIVASE) injection 2 mg      sodium chloride 0.9 % injection 3 mL      Cold Pack 1 packet      Hot Pack 1 packet      dextrose 5 % solution      TREATMENT CONDITIONS    ROS  Past Medical History  Diagnosis Date  . Cancer 08/26/13    esophageal path pending  . ETOH abuse   . Tobacco abuse   . 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 History of aorta-iliac-femoral bypass   . History of radiation therapy 09/14/13-10/26/13    esophageal ca/50.4Gy/101f    Past Surgical History  Procedure Laterality Date  . Cardiac surgery      femoral bypass surgery  . Visceral angiogram N/A 11/25/2013    Procedure: VISCERAL ANGIOGRAM;  Surgeon: VSerafina Mitchell MD;  [facility];  Service: Cardiovascular;  Laterality: N/A;    has Malignant neoplasm of lower third of esophagus; S/P aorto-bifemoral bypass surgery; Chronic  mesenteric arterial insufficiency syndrome; History of aorta-iliac-femoral bypass; ETOH abuse; Tobacco abuse; Thoracoabdominal aortic aneurysm, without rupture; Mesenteric artery insufficiency; AAA (abdominal aortic aneurysm) without rupture; Celiac artery stenosis; Hypercalcemia; Anorexia; Nausea with vomiting; Constipation; Weakness; Dehydration; Hypoalbuminemia; Financial difficulties; Neoplasm related pain; and Dysphagia on his problem list.     is allergic to nsaids.    Medication List       This list is accurate as of: 05/19/14 11:59 PM.  Always use your most recent med list.               CARAFATE 1 GM/10ML suspension  Generic drug:  sucralfate  Take 1 g by mouth 2 (two) times daily.     lidocaine-prilocaine cream  Commonly known as:  EMLA  Apply 1 application topically as needed. Apply approx 30 minutes prior to accessing port.     prochlorperazine 10 MG tablet  Commonly known as:  COMPAZINE  Take 1 tablet (10 mg total) by mouth every 6 (six) hours as needed for nausea or vomiting.     promethazine 25 MG tablet  Commonly known as:  PHENERGAN  Take 1 tablet (25 mg total) by mouth every 6 (six) hours as needed for nausea or vomiting.     traMADol 50 MG tablet  Commonly known as:  ULTRAM  Take 1 tablet (50 mg total) by mouth every 6 (six) hours as needed.         PHYSICAL EXAMINATION  Blood pressure 123/85, pulse 98, temperature 98.3 F (36.8 C), temperature source Oral, resp. rate 17, height _0  (1.803 m), weight 112 lb (50.803 kg).  Physical Exam  Constitutional: He is oriented to person, place, and time. Vital signs are normal. He appears dehydrated. He appears unhealthy. He appears cachectic.  HENT:  Head: Normocephalic and atraumatic.  Mouth/Throat: Oropharynx is clear and moist.  Eyes: Conjunctivae and EOM are normal. Pupils are equal, round, and reactive to light. Right eye exhibits no discharge. Left eye exhibits no discharge. No scleral icterus.  Neck:  Normal range of motion. Neck supple. No JVD present. No tracheal deviation present. No thyromegaly present.  Cardiovascular: Normal rate, regular rhythm, normal heart sounds and intact distal pulses.   Pulmonary/Chest: Effort normal and breath sounds normal. No respiratory distress. He has no wheezes. He has no  rales. He exhibits no tenderness.  Abdominal: Soft. Bowel sounds are normal. He exhibits no distension and no mass. There is no tenderness. There is no rebound and no guarding.  Musculoskeletal: Normal range of motion. He exhibits no edema.  Lymphadenopathy:    He has no cervical adenopathy.  Neurological: He is alert and oriented to person, place, and time. Gait normal.  Skin: Skin is warm and dry. No rash noted. No erythema.  Psychiatric: Affect normal.  Nursing note and vitals reviewed.   LABORATORY DATA:. Appointment on 05/19/2014  Component Date Value Ref Range Status  . Sodium 05/19/2014 139  136 - 145 mEq/L Final  . Potassium 05/19/2014 3.7  3.5 - 5.1 mEq/L Final  . Chloride 05/19/2014 107  98 - 109 mEq/L Final  . CO2 05/19/2014 21* 22 - 29 mEq/L Final  . Glucose 05/19/2014 112  70 - 140 mg/dl Final  . BUN 05/19/2014 5.3* 7.0 - 26.0 mg/dL Final  . Creatinine 05/19/2014 0.6* 0.7 - 1.3 mg/dL Final  . Total Bilirubin 05/19/2014 0.41  0.20 - 1.20 mg/dL Final  . Alkaline Phosphatase 05/19/2014 111  40 - 150 U/L Final  . AST 05/19/2014 16  5 - 34 U/L Final  . ALT 05/19/2014 8  0 - 55 U/L Final  . Total Protein 05/19/2014 6.7  6.4 - 8.3 g/dL Final  . Albumin 05/19/2014 2.8* 3.5 - 5.0 g/dL Final  . Calcium 05/19/2014 9.0  8.4 - 10.4 mg/dL Final  . Anion Gap 05/19/2014 11  3 - 11 mEq/L Final  . EGFR 05/19/2014 >90  >90 ml/min/1.73 m2 Final   eGFR is calculated using the CKD-EPI Creatinine Equation (2009)     RADIOGRAPHIC STUDIES: No results found.  ASSESSMENT/PLAN:    Anorexia Patient continues to complain of some chronic/worsening dysphasia and decreased appetite.   Patient is able to tolerate/swallow liquids; but is having worsening difficulty swallowing solid foods.  Had previously ordered a nutrition consult; the patient has not yet met with nutritionist.  Burnis Medin follow-up and confirm nutritionist appointment.  Dehydration Patient states that he's taking in very little oral intake due to dysphagia.  Patient will receive 1 L normal saline IV fluid rehydration today for his chronic dehydration.  He was also encouraged to push fluids is much as possible.  Dysphagia Patient continues to complain of some chronic/worsening dysphagia.  Most likely this is secondary to previous radiation-induced esophageal stricture.  Advised patient would assist him in obtaining a follow-up appointment for further evaluation per gastroenterologist Dr. Collene Mares.  Hypercalcemia Patient was diagnosed last week with hypercalcemia.  Patient receive Zometa 4 mg to correct the calcium level.  Corrected calcium today is 9.96.  Will continue to monitor closely.  Hypoalbuminemia Albumin is decreased today.  Pt encouraged to push protein as much as possible.     Malignant neoplasm of lower third of esophagus Pt is status post carboplatin/paclitaxel last received 10/12/2013.  He completed radiation tx 10/26/13.  CT scan obtained recently while in the ED revealed progression of disease.  After review of scan results with patient; decision was made to initiate FOLFOX chemotherapy regimen this coming Wednesday 05/31/2014.  Prior to initiating his new chemotherapy regimen-patient will need a Port-A-Cath placed.  He was also given EMLA cream to apply to new Port-A-Cath site prior to Port-A-Cath access. Patient will have Port-A-Cath placement on 05/27/14.     Patient stated understanding of all instructions; and was in agreement with this plan of care. The patient knows to call the  clinic with any problems, questions or concerns.   This was a shared visit with Dr. Benay Spice today.  Dr. Benay Spice briefly  reviewed all FOLFOX chemotherapy regimen including administration, side effects, and risks.  Total time spent with patient was 40 minutes;  with greater than 75 percent of that time spent in face to face counseling regarding his symptoms, and coordination of care and follow up.  Disclaimer: This note was dictated with voice recognition software. Similar sounding words can inadvertently be transcribed and may not be corrected upon review.   Drue Second, NP 05/20/2014   This was a shared visit with Drue Second. I discussed treatment options with Mr. Yu. I recommend FOLFOX chemotherapy. We reviewed the potential toxicities associated with this regimen including the chance for nausea/vomiting, mucositis, diarrhea, hematologic toxicity, and an allergic reaction. We reviewed the rash, hyperpigmentation, and hand/foot syndrome associated with 5 fluorouracil. We discussed the various types of neuropathy seen with oxaliplatin. He agrees to proceed.  We will refer him to Dr. Collene Mares for further evaluation of the dysphagia.  Julieanne Manson, M.D.

## 2014-05-20 NOTE — Addendum Note (Signed)
Addended by: Betsy Coder B on: 05/20/2014 05:50 PM   Modules accepted: Level of Service

## 2014-05-20 NOTE — Assessment & Plan Note (Addendum)
Patient continues to complain of some chronic/worsening dysphasia and decreased appetite.  Patient is able to tolerate/swallow liquids; but is having worsening difficulty swallowing solid foods.  Had previously ordered a nutrition consult; the patient has not yet met with nutritionist.  Brandon [name] follow-up and confirm nutritionist appointment.

## 2014-05-20 NOTE — Assessment & Plan Note (Signed)
Patient was diagnosed last week with hypercalcemia.  Patient receive Zometa 4 mg to correct the calcium level.  Corrected calcium today is 9.96.  Will continue to monitor closely.

## 2014-05-20 NOTE — Assessment & Plan Note (Signed)
Patient continues to complain of some chronic/worsening dysphagia.  Most likely this is secondary to previous radiation-induced esophageal stricture.  Advised patient would assist him in obtaining a follow-up appointment for further evaluation per gastroenterologist Dr. Collene Mares.

## 2014-05-20 NOTE — Assessment & Plan Note (Signed)
Albumin is decreased today.  Pt encouraged to push protein as much as possible.

## 2014-05-20 NOTE — Assessment & Plan Note (Signed)
Patient states that he's taking in very little oral intake due to dysphagia.  Patient will receive 1 L normal saline IV fluid rehydration today for his chronic dehydration.  He was also encouraged to push fluids is much as possible.

## 2014-05-21 ENCOUNTER — Telehealth: Payer: Self-pay | Admitting: Medical Oncology

## 2014-05-21 ENCOUNTER — Telehealth: Payer: Self-pay | Admitting: Nurse Practitioner

## 2014-05-21 NOTE — Telephone Encounter (Signed)
, °

## 2014-05-21 NOTE — Telephone Encounter (Signed)
I spoke to to pt . He  confirmed his appts for the port and his folfox chemo next week . "I hope I make it to then". He reports he is dizzy, sleepy  and constipated. I offered an appt today for IVF and he said "It is too late for me to come in". He did get x-lax to take today. I instructed him to go to ED if his symptoms persist and/or worsen and if he has abdominal pain after taking x-lax. He voices understanding.

## 2014-05-25 ENCOUNTER — Other Ambulatory Visit: Payer: Self-pay | Admitting: Radiology

## 2014-05-27 ENCOUNTER — Ambulatory Visit (HOSPITAL_COMMUNITY)
Admission: RE | Admit: 2014-05-27 | Discharge: 2014-05-27 | Disposition: A | Discharge status: Home / Self Care | Payer: 59 | Source: Ambulatory Visit | Attending: Oncology | Admitting: Oncology

## 2014-05-27 ENCOUNTER — Ambulatory Visit (HOSPITAL_COMMUNITY)
Admission: RE | Admit: 2014-05-27 | Discharge: 2014-05-27 | Disposition: A | Discharge status: Home / Self Care | Payer: 59 | Source: Ambulatory Visit | Attending: Nurse Practitioner | Admitting: Nurse Practitioner

## 2014-05-27 ENCOUNTER — Other Ambulatory Visit: Payer: Self-pay | Admitting: Nurse Practitioner

## 2014-05-27 DIAGNOSIS — Z452 Encounter for adjustment and management of vascular access device: Secondary | ICD-10-CM | POA: Diagnosis present

## 2014-05-27 DIAGNOSIS — F17210 Nicotine dependence, cigarettes, uncomplicated: Secondary | ICD-10-CM | POA: Diagnosis not present

## 2014-05-27 DIAGNOSIS — Z8501 Personal history of malignant neoplasm of esophagus: Secondary | ICD-10-CM | POA: Insufficient documentation

## 2014-05-27 LAB — PROTIME-INR
INR: 1 (ref 0.00–1.49)
Prothrombin Time: 13.3 seconds (ref 11.6–15.2)

## 2014-05-27 LAB — CBC WITH DIFFERENTIAL/PLATELET
BASOS ABS: 0.1 10*3/uL (ref 0.0–0.1)
BASOS PCT: 1 % (ref 0–1)
EOS ABS: 0.1 10*3/uL (ref 0.0–0.7)
Eosinophils Relative: 1 % (ref 0–5)
HCT: 40.8 % (ref 39.0–52.0)
HEMOGLOBIN: 13.7 g/dL (ref 13.0–17.0)
Lymphocytes Relative: 9 % — ABNORMAL LOW (ref 12–46)
Lymphs Abs: 0.9 10*3/uL (ref 0.7–4.0)
MCH: 33.7 pg (ref 26.0–34.0)
MCHC: 33.6 g/dL (ref 30.0–36.0)
MCV: 100.2 fL — ABNORMAL HIGH (ref 78.0–100.0)
Monocytes Absolute: 1 10*3/uL (ref 0.1–1.0)
Monocytes Relative: 9 % (ref 3–12)
NEUTROS ABS: 8.7 10*3/uL — AB (ref 1.7–7.7)
NEUTROS PCT: 80 % — AB (ref 43–77)
Platelets: 363 10*3/uL (ref 150–400)
RBC: 4.07 MIL/uL — ABNORMAL LOW (ref 4.22–5.81)
RDW: 13.7 % (ref 11.5–15.5)
WBC: 10.8 10*3/uL — ABNORMAL HIGH (ref 4.0–10.5)

## 2014-05-27 LAB — APTT: APTT: 34 s (ref 24–37)

## 2014-05-27 MED ORDER — HEPARIN SOD (PORK) LOCK FLUSH 100 UNIT/ML IV SOLN
500.0000 [IU] | Freq: Once | INTRAVENOUS | Status: AC
  Administered 2014-05-27: 500 [IU] via INTRAVENOUS

## 2014-05-27 MED ORDER — CEFAZOLIN SODIUM-DEXTROSE 2-3 GM-% IV SOLR
2.0000 g | INTRAVENOUS | Status: AC
  Administered 2014-05-27: 2 g via INTRAVENOUS

## 2014-05-27 MED ORDER — CEFAZOLIN SODIUM-DEXTROSE 2-3 GM-% IV SOLR
INTRAVENOUS | Status: AC
  Filled 2014-05-27: qty 50

## 2014-05-27 MED ORDER — FENTANYL CITRATE 0.05 MG/ML IJ SOLN
INTRAMUSCULAR | Status: AC
  Filled 2014-05-27: qty 4

## 2014-05-27 MED ORDER — SODIUM CHLORIDE 0.9 % IV SOLN
INTRAVENOUS | Status: DC
  Administered 2014-05-27: 500 mL via INTRAVENOUS

## 2014-05-27 MED ORDER — FENTANYL CITRATE 0.05 MG/ML IJ SOLN
INTRAMUSCULAR | Status: AC | PRN
  Administered 2014-05-27 (×2): 50 ug via INTRAVENOUS

## 2014-05-27 MED ORDER — MIDAZOLAM HCL 2 MG/2ML IJ SOLN
INTRAMUSCULAR | Status: AC
  Filled 2014-05-27: qty 4

## 2014-05-27 MED ORDER — MIDAZOLAM HCL 2 MG/2ML IJ SOLN
INTRAMUSCULAR | Status: AC | PRN
  Administered 2014-05-27: 2 mg via INTRAVENOUS
  Administered 2014-05-27: 1 mg via INTRAVENOUS

## 2014-05-27 NOTE — Discharge Instructions (Signed)

## 2014-05-27 NOTE — H&P (Signed)
Chief Complaint: "I'm here to get a port a cath"  Referring Physician(s): Bacon,Cynthia  History of Present Illness: Brandon [name] is a 58 y.o. male with history of esophageal carcinoma who presents today for port a cath placement for chemotherapy.   Past Medical History  Diagnosis Date  . Cancer 08/26/13    esophageal path pending  . ETOH abuse   . Tobacco abuse   . 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 History of aorta-iliac-femoral bypass   . History of radiation therapy 09/14/13-10/26/13    esophageal ca/50.4Gy/68fx    Past Surgical History  Procedure Laterality Date  . Cardiac surgery      femoral bypass surgery  . Visceral angiogram N/A 11/25/2013    Procedure: VISCERAL ANGIOGRAM;  Surgeon: Serafina Mitchell, MD;  Location: [facility] CATH LAB;  Service: Cardiovascular;  Laterality: N/A;    Allergies: Nsaids  Medications: Prior to Admission medications   Medication Sig Start Date End Date Taking? Authorizing Provider  lidocaine-prilocaine (EMLA) cream Apply 1 application topically as needed. Apply approx 30 minutes prior to accessing port. 05/13/14  Yes Drue Second, NP  prochlorperazine (COMPAZINE) 10 MG tablet Take 1 tablet (10 mg total) by mouth every 6 (six) hours as needed for nausea or vomiting. 05/13/14  Yes Drue Second, NP  promethazine (PHENERGAN) 25 MG tablet Take 1 tablet (25 mg total) by mouth every 6 (six) hours as needed for nausea or vomiting. 05/12/14  Yes Maudry Diego, MD  Sennosides (EX-LAX PO) Take 1 tablet by mouth daily as needed (Constipation).   Yes Historical Provider, MD  traMADol (ULTRAM) 50 MG tablet Take 1 tablet (50 mg total) by mouth every 6 (six) hours as needed. Patient taking differently: Take 50 mg by mouth every 6 (six) hours as needed for moderate pain.  05/12/14  Yes Maudry Diego, MD    Family History  Problem Relation Age of Onset  . Stroke Mother     History   Social History  . Marital  Status: Divorced    Spouse Name: N/A    Number of Children: N/A  . Years of Education: N/A   Social History Main Topics  . Smoking status: Current Every Day Smoker -- 1.00 packs/day for 40 years    Types: Cigarettes    Start date: 08/31/2013  . Smokeless tobacco: Never Used     Comment: down to 1 cigarette daily  . Alcohol Use: Yes     Comment: 12 beer weekly  since last 3 months  . Drug Use: No  . Sexual Activity: Not on file   Other Topics Concern  . Not on file   Social History Narrative       Review of Systems  Constitutional: Positive for appetite change and unexpected weight change. Negative for fever and chills.  HENT: Positive for trouble swallowing.   Respiratory: Positive for cough and shortness of breath.   Cardiovascular: Negative for chest pain.  Gastrointestinal: Positive for nausea, vomiting, abdominal pain and constipation. Negative for blood in stool and abdominal distention.  Genitourinary: Negative for dysuria and hematuria.  Musculoskeletal: Positive for back pain.  Neurological: Negative for headaches.    Vital Signs: BP 117/73 mmHg  Pulse 97  Temp(Src) 98.1 F (36.7 C) (Oral)  Resp 16  SpO2 100%  Physical Exam  Constitutional: He is oriented to person, place, and time.  Cachectic appearing WM in NAD  Cardiovascular: Normal rate and regular  rhythm.   Pulmonary/Chest: Effort normal.  Sl dim BS left base, right clear  Abdominal: Soft. Bowel sounds are normal.  Mild gen tenderness to palpation; bruit noted  Musculoskeletal: Normal range of motion. He exhibits no edema.  Neurological: He is alert and oriented to person, place, and time.    Imaging: Dg Chest 2 View  05/12/2014   CLINICAL DATA:  Chest pain.  EXAM: CHEST  2 VIEW  COMPARISON:  July 30, 2007.  FINDINGS: The heart size and mediastinal contours are within normal limits. Both lungs are clear. Hyperexpansion of the lungs is noted consistent with chronic obstructive pulmonary  disease. No pneumothorax or pleural effusion is noted. Minimally displaced fracture is seen involving the lateral portion of the right ninth rib.  IMPRESSION: Minimally displaced right ninth rib fracture. Findings consistent with chronic obstructive pulmonary disease. No acute cardiopulmonary abnormality seen.   Electronically Signed   By: Sabino Dick M.D.   On: 05/12/2014 13:08   Ct Abdomen Pelvis W Contrast  05/12/2014   CLINICAL DATA:  Upper abdominal pain for 1 week radiating into the back. History of esophageal cancer. Difficulty swallowing. Constipation and fatigue.  EXAM: CT ABDOMEN AND PELVIS WITH CONTRAST  TECHNIQUE: Multidetector CT imaging of the abdomen and pelvis was performed using the standard protocol following bolus administration of intravenous contrast.  CONTRAST:  100 mL OMNIPAQUE IOHEXOL 300 MG/ML  SOLN  COMPARISON:  CT abdomen and pelvis 12/04/2013 and CT chest, abdomen and pelvis 11/10/2013.  FINDINGS: The lung bases are clear. No pleural or pericardial effusion. Aneurysmal dilatation of the distal thoracic aorta which measures 5.6 x 5.1 cm is identified. On the most recent examination, it measured 5.3 x 5.1 cm.  There is new extensive lymphadenopathy in the abdomen. New extensive lymphadenopathy is identified with multiple nodes demonstrating central low attenuation consistent with necrosis. Index node immediately anterior to the descending abdominal aorta in the upper abdomen on image 24 measures 3.9 x 2.3 cm compared to 1.4 cm on the most recent exam. A node posterior to the body of the pancreas on image 27 measures 1.6 x 2.3 cm. A second more inferior left para-aortic node measures 1.7 x 2.5 cm on image 30. Final index node in the left para-aortic station on image 38 measures 2.3 x 1.6 cm.  The gallbladder is decompressed but otherwise unremarkable. The liver, adrenal glands, kidneys and spleen appear normal. Again seen is postoperative change of aortic bi-iliac reconstruction with a  patent bypass graft identified. The appearance is unchanged. No fluid collection is identified. There is a large volume of stool in the ascending and transverse colon. The colon is otherwise unremarkable. The appendix appears normal. The stomach and small bowel are unremarkable. No lytic or sclerotic bony lesion is identified.  IMPRESSION: Dominant finding is new extensive lymphadenopathy in the upper abdomen most consistent with metastatic disease from esophageal carcinoma.  Distal thoracic aortic aneurysm measuring 5.6 x 5.1 cm appears slightly increased compared to the most recent examination.  Status post aorto bi-iliac bypass grafting, unchanged in appearance.   Electronically Signed   By: Inge Rise M.D.   On: 05/12/2014 15:20    Labs:  CBC:  Recent Labs  10/12/13 0758 10/19/13 1148 11/25/13 0755 05/12/14 1136 05/13/14 1304  WBC 3.9* 2.7*  --  10.5 11.2*  HGB 11.5* 10.3* 13.6 14.6 14.3  HCT 34.3* 31.3* 40.0 43.2 42.8  PLT 176 144  --  258 247    COAGS: No results for input(s): INR,  APTT in the last 8760 hours.  BMP:  Recent Labs  10/19/13 1148 11/25/13 0755 05/12/14 1136 05/13/14 1304 05/19/14 0911  NA 141 143 142 141 139  K 4.0 3.9 3.2* 3.8 3.7  CL  --  103 102  --   --   CO2 25  --  27 24 21*  GLUCOSE 113 80 91 109 112  BUN 9.7 <3* 8 6.7* 5.3*  CALCIUM 9.4  --  11.2* 10.4 9.0  CREATININE 0.8 0.90 0.74 0.8 0.6*  GFRNONAA  --   --  >90  --   --   GFRAA  --   --  >90  --   --     LIVER FUNCTION TESTS:  Recent Labs  10/19/13 1148 05/12/14 1136 05/13/14 1304 05/19/14 0911  BILITOT 0.30 0.3 0.34 0.41  AST 16 14 13 16   ALT 13 5 7 8   ALKPHOS 59 111 107 111  PROT 6.0* 7.0 6.4 6.7  ALBUMIN 3.0* 2.9* 2.7* 2.8*  05/27/2014 labs pending  TUMOR MARKERS: No results for input(s): AFPTM, CEA, CA199, CHROMGRNA in the last 8760 hours.  Assessment and Plan: Brandon [name] is a 58 y.o. male with history of esophageal carcinoma who presents today for port a  cath placement for chemotherapy. Details/risks of procedure d/w pt with his understanding and consent.      Signed: Autumn Messing 05/27/2014, 12:55 PM

## 2014-05-27 NOTE — Procedures (Signed)
Interventional Radiology Procedure Note  Procedure: Placement of a right IJ approach single lumen PowerPort.  Tip is positioned at the superior cavoatrial junction and catheter is ready for immediate use.  Complications: No immediate Recommendations:  - Ok to shower tomorrow - Do not submerge for 7 days - Routine line care   Signed,  Teodoro Jeffreys S. Theodis Kinsel, DO    

## 2014-05-31 ENCOUNTER — Ambulatory Visit: Payer: 59 | Admitting: Oncology

## 2014-05-31 ENCOUNTER — Other Ambulatory Visit: Payer: 59

## 2014-06-01 ENCOUNTER — Encounter: Payer: Self-pay | Admitting: Nurse Practitioner

## 2014-06-01 ENCOUNTER — Ambulatory Visit: Payer: 59 | Admitting: Nutrition

## 2014-06-01 ENCOUNTER — Telehealth: Payer: Self-pay | Admitting: Medical Oncology

## 2014-06-01 ENCOUNTER — Ambulatory Visit (HOSPITAL_BASED_OUTPATIENT_CLINIC_OR_DEPARTMENT_OTHER): Payer: 59

## 2014-06-01 ENCOUNTER — Ambulatory Visit (HOSPITAL_BASED_OUTPATIENT_CLINIC_OR_DEPARTMENT_OTHER): Payer: 59 | Admitting: Lab

## 2014-06-01 ENCOUNTER — Ambulatory Visit (HOSPITAL_BASED_OUTPATIENT_CLINIC_OR_DEPARTMENT_OTHER): Payer: 59 | Admitting: Nurse Practitioner

## 2014-06-01 ENCOUNTER — Other Ambulatory Visit: Payer: 59

## 2014-06-01 DIAGNOSIS — C155 Malignant neoplasm of lower third of esophagus: Secondary | ICD-10-CM

## 2014-06-01 DIAGNOSIS — Z95828 Presence of other vascular implants and grafts: Secondary | ICD-10-CM

## 2014-06-01 DIAGNOSIS — Z452 Encounter for adjustment and management of vascular access device: Secondary | ICD-10-CM

## 2014-06-01 DIAGNOSIS — E8809 Other disorders of plasma-protein metabolism, not elsewhere classified: Secondary | ICD-10-CM

## 2014-06-01 DIAGNOSIS — Z5111 Encounter for antineoplastic chemotherapy: Secondary | ICD-10-CM

## 2014-06-01 DIAGNOSIS — R1310 Dysphagia, unspecified: Secondary | ICD-10-CM

## 2014-06-01 DIAGNOSIS — Z599 Problem related to housing and economic circumstances, unspecified: Secondary | ICD-10-CM

## 2014-06-01 DIAGNOSIS — G893 Neoplasm related pain (acute) (chronic): Secondary | ICD-10-CM

## 2014-06-01 DIAGNOSIS — Z598 Other problems related to housing and economic circumstances: Secondary | ICD-10-CM

## 2014-06-01 LAB — COMPREHENSIVE METABOLIC PANEL (CC13)
ALT: 7 U/L (ref 0–55)
AST: 13 U/L (ref 5–34)
Albumin: 2.8 g/dL — ABNORMAL LOW (ref 3.5–5.0)
Alkaline Phosphatase: 94 U/L (ref 40–150)
Anion Gap: 11 mEq/L (ref 3–11)
BILIRUBIN TOTAL: 0.29 mg/dL (ref 0.20–1.20)
BUN: 6.8 mg/dL — ABNORMAL LOW (ref 7.0–26.0)
CO2: 22 mEq/L (ref 22–29)
Calcium: 9.9 mg/dL (ref 8.4–10.4)
Chloride: 102 mEq/L (ref 98–109)
Creatinine: 0.7 mg/dL (ref 0.7–1.3)
GLUCOSE: 146 mg/dL — AB (ref 70–140)
Potassium: 3.9 mEq/L (ref 3.5–5.1)
SODIUM: 136 meq/L (ref 136–145)
TOTAL PROTEIN: 6.8 g/dL (ref 6.4–8.3)

## 2014-06-01 MED ORDER — DEXTROSE 5 % IV SOLN
Freq: Once | INTRAVENOUS | Status: AC
  Administered 2014-06-01: 09:00:00 via INTRAVENOUS

## 2014-06-01 MED ORDER — DEXAMETHASONE SODIUM PHOSPHATE 10 MG/ML IJ SOLN
10.0000 mg | Freq: Once | INTRAMUSCULAR | Status: AC
  Administered 2014-06-01: 10 mg via INTRAVENOUS

## 2014-06-01 MED ORDER — LEUCOVORIN CALCIUM INJECTION 350 MG
400.0000 mg/m2 | Freq: Once | INTRAVENOUS | Status: AC
  Administered 2014-06-01: 644 mg via INTRAVENOUS
  Filled 2014-06-01: qty 32.2

## 2014-06-01 MED ORDER — TRAMADOL HCL 50 MG PO TABS: 50.0000 mg | ORAL_TABLET | Freq: Four times a day (QID) | ORAL | Status: AC | PRN

## 2014-06-01 MED ORDER — ONDANSETRON 8 MG PO TBDP: 8.0000 mg | ORAL_TABLET | Freq: Three times a day (TID) | ORAL | Status: DC | PRN

## 2014-06-01 MED ORDER — SODIUM CHLORIDE 0.9 % IJ SOLN
10.0000 mL | INTRAMUSCULAR | Status: DC | PRN
  Administered 2014-06-01: 10 mL via INTRAVENOUS
  Filled 2014-06-01: qty 10

## 2014-06-01 MED ORDER — DEXAMETHASONE SODIUM PHOSPHATE 10 MG/ML IJ SOLN
INTRAMUSCULAR | Status: AC
  Filled 2014-06-01: qty 1

## 2014-06-01 MED ORDER — MORPHINE SULFATE 4 MG/ML IJ SOLN
2.0000 mg | Freq: Once | INTRAMUSCULAR | Status: AC
  Administered 2014-06-01: 2 mg via INTRAVENOUS

## 2014-06-01 MED ORDER — OXALIPLATIN CHEMO INJECTION 100 MG/20ML
85.0000 mg/m2 | Freq: Once | INTRAVENOUS | Status: AC
  Administered 2014-06-01: 135 mg via INTRAVENOUS
  Filled 2014-06-01: qty 27

## 2014-06-01 MED ORDER — SODIUM CHLORIDE 0.9 % IV SOLN
2400.0000 mg/m2 | INTRAVENOUS | Status: DC
  Administered 2014-06-01: 3850 mg via INTRAVENOUS
  Filled 2014-06-01: qty 77

## 2014-06-01 MED ORDER — FLUOROURACIL CHEMO INJECTION 2.5 GM/50ML
400.0000 mg/m2 | Freq: Once | INTRAVENOUS | Status: AC
  Administered 2014-06-01: 650 mg via INTRAVENOUS
  Filled 2014-06-01: qty 13

## 2014-06-01 MED ORDER — ONDANSETRON 8 MG/NS 50 ML IVPB
INTRAVENOUS | Status: AC
Start: 2014-06-01 — End: 2014-06-01
  Filled 2014-06-01: qty 8

## 2014-06-01 MED ORDER — ONDANSETRON 8 MG/50ML IVPB (CHCC)
8.0000 mg | Freq: Once | INTRAVENOUS | Status: AC
  Administered 2014-06-01: 8 mg via INTRAVENOUS

## 2014-06-01 MED ORDER — MORPHINE SULFATE 4 MG/ML IJ SOLN
INTRAMUSCULAR | Status: AC
  Filled 2014-06-01: qty 1

## 2014-06-01 NOTE — Assessment & Plan Note (Signed)
Patient continues to complain of some chronic/worsening dysphagia.  Most likely this is secondary to previous radiation-induced esophageal stricture.  Patient states that he continues able to swallow the tramadol with no difficulty.  Nutritionist visited with patient today while in the infusion area; and patient was given protein shakes as well.    Advised patient would assist him in obtaining a follow-up appointment for further evaluation per gastroenterologist Dr. Collene Mares.

## 2014-06-01 NOTE — Assessment & Plan Note (Signed)
Patient has met recently with the financial counselor here at the cancer center.  He has been given a Insurance claims handler to assist with prescription medications.  Patient has plans to follow-up once again with the financial counselor today in regards to paperwork and the possibility of filing for disability.  Patient states that he has been very anxious since he has been unable to work for the past 2 weeks.

## 2014-06-01 NOTE — Progress Notes (Signed)
will   SYMPTOM MANAGEMENT CLINIC   HPI: Brandon [name] 58 y.o. male diagnosed with esophageal cancer.  Patient status post carboplatin/paclitaxel chemotherapy regimen which was completed in May 2015.  Patient completed radiation treatments in May 2015 as well.  Patient is here today to initiate FOLFOX chemotherapy regimen due to progression of disease.  Recent restaging CT did reveal progression of patient's disease.  Patient presents to the [facility] today to initiate cycle 1 of his FOLFOX chemotherapy regimen.  Patient had a right upper chest Port-A-Cath placed this past Thursday, 05/27/2014.  Patient continues to complain of dysphagia; stating that he is able to drink liquids with minimal difficulty-but otherwise avoids most solid foods.  He states he is able to swallow his Ultram pain medication with no difficulty as well.  He is planning on following up with Dr. Tammi Klippel gastroenterologist for a possible repeat esophageal dilatation.  Patient is also complaining of continued mid to upper central abdomen discomfort.  He is requesting a refill of Ultram today.  Patient also expressed concern regarding his employment.  He states that he had wanted to continue working on a full-time basis; but actually been unable to work for the past 2 weeks due to pain in appointments here at the cancer center.  He has ordered a met with the [facility] counselor recently; has plans to follow back up with him again today.  He is questioning if he would be able to obtain disability.  HPI  CURRENT THERAPY: Upcoming Treatment Dates - COLORECTAL FOLFOX q14d Days with orders from any treatment category:  06/03/2014      [facility] COMMUNICATION      sodium chloride 0.9 % injection 10 mL      heparin lock flush 100 unit/mL      heparin lock flush 100 unit/mL      alteplase (CATHFLO ACTIVASE) injection 2 mg      sodium chloride 0.9 % injection 3 mL      Cold Pack 1 packet 06/15/2014  SCHEDULING COMMUNICATION      ondansetron (ZOFRAN) IVPB 8 mg      dexamethasone (DECADRON) injection 10 mg      oxaliplatin (ELOXATIN) 135 mg in dextrose 5 % 500 mL chemo infusion      leucovorin 644 mg in dextrose 5 % 250 mL infusion      fluorouracil (ADRUCIL) chemo injection 650 mg      fluorouracil (ADRUCIL) 3,850 mg in sodium chloride 0.9 % 150 mL chemo infusion      sodium chloride 0.9 % injection 10 mL      heparin lock flush 100 unit/mL      heparin lock flush 100 unit/mL      alteplase (CATHFLO ACTIVASE) injection 2 mg      sodium chloride 0.9 % injection 3 mL      Cold Pack 1 packet      Hot Pack 1 packet      dextrose 5 % solution      TREATMENT CONDITIONS 06/17/2014      SCHEDULING COMMUNICATION      sodium chloride 0.9 % injection 10 mL      heparin lock flush 100 unit/mL      heparin lock flush 100 unit/mL      alteplase (CATHFLO ACTIVASE) injection 2 mg      sodium chloride 0.9 % injection 3 mL      Cold Pack 1 packet    ROS  Past Medical History  Diagnosis Date  . Cancer 08/26/13    esophageal path pending  . ETOH abuse   . Tobacco abuse   . 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 History of aorta-iliac-femoral bypass   . History of radiation therapy 09/14/13-10/26/13    esophageal ca/50.4Gy/82f    Past Surgical History  Procedure Laterality Date  . Cardiac surgery      femoral bypass surgery  . Visceral angiogram N/A 11/25/2013    Procedure: VISCERAL ANGIOGRAM;  Surgeon: VSerafina Mitchell MD;  [facility];  Service: Cardiovascular;  Laterality: N/A;    has Malignant neoplasm of lower third of esophagus; S/P aorto-bifemoral bypass surgery; Chronic mesenteric arterial insufficiency syndrome; History of aorta-iliac-femoral bypass; ETOH abuse; Tobacco abuse; Thoracoabdominal aortic aneurysm, without rupture; Mesenteric artery insufficiency; AAA (abdominal aortic aneurysm) without rupture; Celiac artery stenosis;  Hypercalcemia; Anorexia; Nausea with vomiting; Constipation; Weakness; Dehydration; Hypoalbuminemia; Financial difficulties; Neoplasm related pain; and Dysphagia on his problem list.     is allergic to nsaids.    Medication List       This list is accurate as of: 06/01/14 12:25 PM.  Always use your most recent med list.               CARAFATE 1 GM/10ML suspension  Generic drug:  sucralfate  Take 10 mLs by mouth 4 (four) times daily as needed.     EX-LAX PO  Take 1 tablet by mouth daily as needed (Constipation).     lidocaine-prilocaine cream  Commonly known as:  EMLA  Apply 1 application topically as needed. Apply approx 30 minutes prior to accessing port.     ondansetron 8 MG disintegrating tablet  Commonly known as:  ZOFRAN ODT  Take 1 tablet (8 mg total) by mouth every 8 (eight) hours as needed for nausea or vomiting.     prochlorperazine 10 MG tablet  Commonly known as:  COMPAZINE  Take 1 tablet (10 mg total) by mouth every 6 (six) hours as needed for nausea or vomiting.     promethazine 25 MG tablet  Commonly known as:  PHENERGAN  Take 1 tablet (25 mg total) by mouth every 6 (six) hours as needed for nausea or vomiting.     traMADol 50 MG tablet  Commonly known as:  ULTRAM  Take 1 tablet (50 mg total) by mouth every 6 (six) hours as needed.         PHYSICAL EXAMINATION  Vitals: 127/85, HR 108, temp 98.3    Physical Exam  Constitutional: He is oriented to person, place, and time. Vital signs are normal. He appears dehydrated. He appears unhealthy. He appears cachectic.  HENT:  Head: Normocephalic and atraumatic.  Mouth/Throat: Oropharyngeal exudate present.  Eyes: Conjunctivae and EOM are normal. Pupils are equal, round, and reactive to light. Right eye exhibits no discharge. Left eye exhibits no discharge. No scleral icterus.  Neck: Normal range of motion. Neck supple. No JVD present. No tracheal deviation present. No thyromegaly present.  Cardiovascular:  Normal rate, regular rhythm, normal heart sounds and intact distal pulses.   Pulmonary/Chest: Effort normal and breath sounds normal. No respiratory distress. He has no wheezes. He has no rales. He exhibits no tenderness.  Abdominal: Soft. Bowel sounds are normal. He exhibits no distension and no mass. There is tenderness. There is no rebound and no guarding.  Mild tenderness to lower epigastric region of the abdomen with deep palpation only.  Musculoskeletal: Normal range of  motion. He exhibits no edema or tenderness.  Lymphadenopathy:    He has no cervical adenopathy.  Neurological: He is alert and oriented to person, place, and time. Gait normal.  Skin: Skin is warm and dry. No rash noted. No erythema. There is pallor.  Psychiatric: Affect normal.  Nursing note and vitals reviewed.   LABORATORY DATA:. Clinical Support on 06/01/2014  Component Date Value Ref Range Status  . Sodium 06/01/2014 136  136 - 145 mEq/L Final  . Potassium 06/01/2014 3.9  3.5 - 5.1 mEq/L Final  . Chloride 06/01/2014 102  98 - 109 mEq/L Final  . CO2 06/01/2014 22  22 - 29 mEq/L Final  . Glucose 06/01/2014 146* 70 - 140 mg/dl Final  . BUN 06/01/2014 6.8* 7.0 - 26.0 mg/dL Final  . Creatinine 06/01/2014 0.7  0.7 - 1.3 mg/dL Final  . Total Bilirubin 06/01/2014 0.29  0.20 - 1.20 mg/dL Final  . Alkaline Phosphatase 06/01/2014 94  40 - 150 U/L Final  . AST 06/01/2014 13  5 - 34 U/L Final  . ALT 06/01/2014 7  0 - 55 U/L Final  . Total Protein 06/01/2014 6.8  6.4 - 8.3 g/dL Final  . Albumin 06/01/2014 2.8* 3.5 - 5.0 g/dL Final  . Calcium 06/01/2014 9.9  8.4 - 10.4 mg/dL Final  . Anion Gap 06/01/2014 11  3 - 11 mEq/L Final  . EGFR 06/01/2014 >90  >90 ml/min/1.73 m2 Final   eGFR is calculated using the CKD-EPI Creatinine Equation (2009)     RADIOGRAPHIC STUDIES: No results found.  ASSESSMENT/PLAN:    Dysphagia Patient continues to complain of some chronic/worsening dysphagia.  Most likely this is secondary to  previous radiation-induced esophageal stricture.  Patient states that he continues able to swallow the tramadol with no difficulty.  Nutritionist visited with patient today while in the infusion area; and patient was given protein shakes as well.    Advised patient would assist him in obtaining a follow-up appointment for further evaluation per gastroenterologist Dr. Collene Mares.    Financial difficulties Patient has met recently with the financial counselor here at the cancer center.  He has been given a Insurance claims handler to assist with prescription medications.  Patient has plans to follow-up once again with the financial counselor today in regards to paperwork and the possibility of filing for disability.  Patient states that he has been very anxious since he has been unable to work for the past 2 weeks.  Hypoalbuminemia Albumin remains low at 2.8.  Patient was encouraged to push protein in his diet is much as possible.  Also, patient did meet with the nutritionist again today; and was given protein shakes to take home.  Malignant neoplasm of lower third of esophagus Pt is status post carboplatin/paclitaxel last received 10/12/2013.  He completed radiation tx 10/26/13.  Patient will proceed today with cycle 1, day 1 of his FOLFOX chemotherapy regimen.  He has plans to return on 06/03/2014 for discontinuation of his chemotherapy pump.  He has plans to return on 06/14/2014 for his next cycle of chemotherapy.     Neoplasm related pain Patient is complaining of increasing generalized abdominal discomfort that radiates to his back.  He denies any specific flank pain.  He denies any dysuria.  Patient has been taking tramadol; which she states is fairly effective in managing his pain.  He states that he has run out of the tramadol; and request a refill of his pain medication today.  He states that he is  able to swallow the tramadol with no difficulties at this time.  Prescription refill for the tramadol was given  to the patient today.        Patient stated understanding of all instructions; and was in agreement with this plan of care. The patient knows to call the clinic with any problems, questions or concerns.   Review/collaboration with Dr. Benay Spice regarding all aspects of patient's visit today.   Total time spent with patient was 25 minutes;  with greater than 75 percent of that time spent in face to face counseling regarding his symptoms, and coordination of care and follow up.  Disclaimer: This note was dictated with voice recognition software. Similar sounding words can inadvertently be transcribed and may not be corrected upon review.   Drue Second, NP 06/01/2014

## 2014-06-01 NOTE — Telephone Encounter (Signed)
Return call from Butch Penny at [place] office that patient has a f/u appt with Dr. Collene Mares for revaluation of dysphagia. Patient's appt scheduled for January 7th 2016. Patient also ran out of dexalant, per Butch Penny, their office will provide more samples for him.   [employer] informed.  Dr. Benay Spice inboxed.

## 2014-06-01 NOTE — Assessment & Plan Note (Addendum)
Patient is complaining of increasing generalized abdominal discomfort that radiates to his back.  He denies any specific flank pain.  He denies any dysuria.  Patient has been taking tramadol; which she states is fairly effective in managing his pain.  He states that he has run out of the tramadol; and request a refill of his pain medication today.  He states that he is able to swallow the tramadol with no difficulties at this time.  Prescription refill for the tramadol was given to the patient today.

## 2014-06-01 NOTE — Assessment & Plan Note (Signed)
Pt is status post carboplatin/paclitaxel last received 10/12/2013.  He completed radiation tx 10/26/13.  Patient will proceed today with cycle 1, day 1 of his FOLFOX chemotherapy regimen.  He has plans to return on 06/03/2014 for discontinuation of his chemotherapy pump.  He has plans to return on 06/14/2014 for his next cycle of chemotherapy.

## 2014-06-01 NOTE — Assessment & Plan Note (Signed)
Albumin remains low at 2.8.  Patient was encouraged to push protein in his diet is much as possible.  Also, patient did meet with the nutritionist again today; and was given protein shakes to take home.

## 2014-06-01 NOTE — Patient Instructions (Signed)

## 2014-06-01 NOTE — Progress Notes (Signed)
reviewed spill kit contents and pt booklet given with number to call for any problems . Pt also instructed not to eat or drink or touch anything cold.He voices understanding.Pt given directions to [place]

## 2014-06-01 NOTE — Telephone Encounter (Signed)
Per Selena Lesser, NP., call to [facility] for pt to have a f/u appt d/t pt c/o dysphagia. Spoke with Rip Harbour, per Rip Harbour, patient was scheduled to have a "phone update" call today.   Butch Penny, nurse, returned call to office informing us that patient had an endoscopy on 04/02/14 and per their phone update with patient, he stated that he was not having any issues. Informed Rip Harbour that patient was in today and c/o of having trouble swallowing and NP would like for him to have a f/u appt with their office. Rip Harbour states patient should be taking nexium and dexalant, informed Rip Harbour I wasn't sure if he was d/t those medications are not on his med list. She states she will call him today and schedule an appt (soonest would be mid-January) and let our office know when patient is scheduled.   Selena Lesser, NP informed of the above.

## 2014-06-01 NOTE — Patient Instructions (Signed)
[facility] Discharge Instructions for Patients Receiving Chemotherapy  Today you received the following chemotherapy agents 55fu,leucovorin, oxaliplatin To help prevent nausea and vomiting after your treatment, we encourage you to take your nausea medication as prescribed.  If you develop nausea and vomiting that is not controlled by your nausea medication, call the clinic.   BELOW ARE SYMPTOMS THAT SHOULD BE REPORTED IMMEDIATELY:  *FEVER GREATER THAN 100.5 F  *CHILLS WITH OR WITHOUT FEVER  NAUSEA AND VOMITING THAT IS NOT CONTROLLED WITH YOUR NAUSEA MEDICATION  *UNUSUAL SHORTNESS OF BREATH  *UNUSUAL BRUISING OR BLEEDING  TENDERNESS IN MOUTH AND THROAT WITH OR WITHOUT PRESENCE OF ULCERS  *URINARY PROBLEMS  *BOWEL PROBLEMS  UNUSUAL RASH Items with * indicate a potential emergency and should be followed up as soon as possible.  Feel free to call the clinic you have any questions or concerns. The clinic phone number is [phone].

## 2014-06-01 NOTE — Progress Notes (Signed)
Nutrition follow-up completed with patient in the chemotherapy room.  Patient is being treated for cancer of the distal esophagus/GE junction. Patient has had substantial weight loss with weight documented is 110 pounds December 17, down from 124 pounds September 23. Patient was taking regular diet through the middle of October when it became more difficult to swallow. Patient states he has been constipated for at least 2 weeks. Nausea okay today. Patient has been drinking boost 3 times a day.  Nutrition diagnosis inadequate oral intake continues.  Patient meets criteria for severe malnutrition in the context of chronic illness secondary to 11% weight loss in 3 months and severe depletion of body fat and muscle mass on physical exam.  Intervention:  Recommended patient increase boost to a minimum of 4 boost plus daily or 6-8 bottles of boost compact daily. Provided patient samples of boost compact. Educated patient on strategies for increasing oral intake of other liquids and soft blenderized foods. Questions were answered.  Teach back method used.  Monitoring, evaluation, goals: Patient will work to increase oral intake to minimize further weight loss.  Next visit: Monday, January 4, during chemotherapy.  **Disclaimer: This note was dictated with voice recognition software. Similar sounding words can inadvertently be transcribed and this note may contain transcription errors which may not have been corrected upon publication of note.**

## 2014-06-01 NOTE — Progress Notes (Signed)
Confirmed that pt last underwent endoscopy with esophageal dilatation per Dr. Juanita Craver at the [facility] on 08/26/13.  Will call to confirm pt has a follow up appt already set up.

## 2014-06-02 ENCOUNTER — Telehealth: Payer: Self-pay | Admitting: *Deleted

## 2014-06-02 NOTE — Telephone Encounter (Signed)
Called Brandon [name] at [phone] mobile number(s).  Message left requesting a return call for chemotherapy follow up.  Awaiting return call from patient.

## 2014-06-02 NOTE — Telephone Encounter (Signed)
-----   Message from Ardeen Garland, RN sent at 06/01/2014  4:25 PM EST ----- 1st folfox. Tuesday [phone]-do not call late in day

## 2014-06-03 ENCOUNTER — Ambulatory Visit (HOSPITAL_BASED_OUTPATIENT_CLINIC_OR_DEPARTMENT_OTHER): Payer: 59

## 2014-06-03 DIAGNOSIS — C155 Malignant neoplasm of lower third of esophagus: Secondary | ICD-10-CM

## 2014-06-03 MED ORDER — SODIUM CHLORIDE 0.9 % IJ SOLN
10.0000 mL | INTRAMUSCULAR | Status: DC | PRN
  Administered 2014-06-03: 10 mL
  Filled 2014-06-03: qty 10

## 2014-06-03 MED ORDER — HEPARIN SOD (PORK) LOCK FLUSH 100 UNIT/ML IV SOLN
500.0000 [IU] | Freq: Once | INTRAVENOUS | Status: AC | PRN
  Administered 2014-06-03: 500 [IU]
  Filled 2014-06-03: qty 5

## 2014-06-10 ENCOUNTER — Other Ambulatory Visit: Payer: Self-pay | Admitting: Nurse Practitioner

## 2014-06-10 DIAGNOSIS — C155 Malignant neoplasm of lower third of esophagus: Secondary | ICD-10-CM

## 2014-06-13 ENCOUNTER — Other Ambulatory Visit: Payer: Self-pay | Admitting: Oncology

## 2014-06-14 ENCOUNTER — Ambulatory Visit (HOSPITAL_BASED_OUTPATIENT_CLINIC_OR_DEPARTMENT_OTHER): Payer: 59 | Admitting: Nurse Practitioner

## 2014-06-14 ENCOUNTER — Ambulatory Visit (HOSPITAL_BASED_OUTPATIENT_CLINIC_OR_DEPARTMENT_OTHER): Payer: 59

## 2014-06-14 ENCOUNTER — Telehealth: Payer: Self-pay | Admitting: Oncology

## 2014-06-14 ENCOUNTER — Encounter: Payer: Self-pay | Admitting: *Deleted

## 2014-06-14 ENCOUNTER — Other Ambulatory Visit (HOSPITAL_BASED_OUTPATIENT_CLINIC_OR_DEPARTMENT_OTHER): Payer: 59

## 2014-06-14 ENCOUNTER — Ambulatory Visit: Payer: 59

## 2014-06-14 ENCOUNTER — Ambulatory Visit: Payer: 59 | Admitting: Nutrition

## 2014-06-14 VITALS — BP 116/81 | HR 92 | Temp 97.8°F | Resp 18 | Ht 71.0 in | Wt 106.3 lb

## 2014-06-14 DIAGNOSIS — Z5111 Encounter for antineoplastic chemotherapy: Secondary | ICD-10-CM

## 2014-06-14 DIAGNOSIS — Z95828 Presence of other vascular implants and grafts: Secondary | ICD-10-CM

## 2014-06-14 DIAGNOSIS — R634 Abnormal weight loss: Secondary | ICD-10-CM

## 2014-06-14 DIAGNOSIS — D225 Melanocytic nevi of trunk: Secondary | ICD-10-CM

## 2014-06-14 DIAGNOSIS — C155 Malignant neoplasm of lower third of esophagus: Secondary | ICD-10-CM

## 2014-06-14 DIAGNOSIS — R1310 Dysphagia, unspecified: Secondary | ICD-10-CM

## 2014-06-14 DIAGNOSIS — I739 Peripheral vascular disease, unspecified: Secondary | ICD-10-CM

## 2014-06-14 LAB — COMPREHENSIVE METABOLIC PANEL (CC13)
ALT: 6 U/L (ref 0–55)
AST: 12 U/L (ref 5–34)
Albumin: 2.8 g/dL — ABNORMAL LOW (ref 3.5–5.0)
Alkaline Phosphatase: 87 U/L (ref 40–150)
Anion Gap: 7 mEq/L (ref 3–11)
BUN: 7.4 mg/dL (ref 7.0–26.0)
CALCIUM: 8.8 mg/dL (ref 8.4–10.4)
CHLORIDE: 103 meq/L (ref 98–109)
CO2: 27 meq/L (ref 22–29)
CREATININE: 0.6 mg/dL — AB (ref 0.7–1.3)
EGFR: >90 mL/min/{1.73_m2} (ref >90)
Glucose: 102 mg/dl (ref 70–140)
Potassium: 3.2 mEq/L — ABNORMAL LOW (ref 3.5–5.1)
Sodium: 137 mEq/L (ref 136–145)
Total Bilirubin: 0.33 mg/dL (ref 0.20–1.20)
Total Protein: 6.1 g/dL — ABNORMAL LOW (ref 6.4–8.3)

## 2014-06-14 LAB — CBC WITH DIFFERENTIAL/PLATELET
BASO%: 1 % (ref 0.0–2.0)
BASOS ABS: 0.1 10*3/uL (ref 0.0–0.1)
EOS ABS: 0.1 10*3/uL (ref 0.0–0.5)
EOS%: 1.3 % (ref 0.0–7.0)
HCT: 37.3 % — ABNORMAL LOW (ref 38.4–49.9)
HEMOGLOBIN: 12.5 g/dL — AB (ref 13.0–17.1)
LYMPH%: 14.1 % (ref 14.0–49.0)
MCH: 32.1 pg (ref 27.2–33.4)
MCHC: 33.5 g/dL (ref 32.0–36.0)
MCV: 95.9 fL (ref 79.3–98.0)
MONO#: 0.6 10*3/uL (ref 0.1–0.9)
MONO%: 9.9 % (ref 0.0–14.0)
NEUT%: 73.7 % (ref 39.0–75.0)
NEUTROS ABS: 4.5 10*3/uL (ref 1.5–6.5)
PLATELETS: 210 10*3/uL (ref 140–400)
RBC: 3.89 10*6/uL — ABNORMAL LOW (ref 4.20–5.82)
RDW: 13.9 % (ref 11.0–14.6)
WBC: 6.2 10*3/uL (ref 4.0–10.3)
lymph#: 0.9 10*3/uL (ref 0.9–3.3)

## 2014-06-14 MED ORDER — ONDANSETRON 8 MG/NS 50 ML IVPB
INTRAVENOUS | Status: AC
  Filled 2014-06-14: qty 8

## 2014-06-14 MED ORDER — LEUCOVORIN CALCIUM INJECTION 350 MG
400.0000 mg/m2 | Freq: Once | INTRAVENOUS | Status: AC
  Administered 2014-06-14: 644 mg via INTRAVENOUS
  Filled 2014-06-14: qty 32.2

## 2014-06-14 MED ORDER — SODIUM CHLORIDE 0.9 % IJ SOLN
10.0000 mL | INTRAMUSCULAR | Status: DC | PRN
  Administered 2014-06-14: 10 mL via INTRAVENOUS
  Filled 2014-06-14: qty 10

## 2014-06-14 MED ORDER — HYDROCODONE-ACETAMINOPHEN 5-325 MG PO TABS: 1.0000 | ORAL_TABLET | Freq: Four times a day (QID) | ORAL | Status: AC | PRN

## 2014-06-14 MED ORDER — FLUOROURACIL CHEMO INJECTION 2.5 GM/50ML
400.0000 mg/m2 | Freq: Once | INTRAVENOUS | Status: AC
  Administered 2014-06-14: 650 mg via INTRAVENOUS
  Filled 2014-06-14: qty 13

## 2014-06-14 MED ORDER — DEXTROSE 5 % IV SOLN
Freq: Once | INTRAVENOUS | Status: AC
  Administered 2014-06-14: 13:00:00 via INTRAVENOUS

## 2014-06-14 MED ORDER — SODIUM CHLORIDE 0.9 % IV SOLN
2400.0000 mg/m2 | INTRAVENOUS | Status: DC
  Administered 2014-06-14: 3850 mg via INTRAVENOUS
  Filled 2014-06-14: qty 77

## 2014-06-14 MED ORDER — OXALIPLATIN CHEMO INJECTION 100 MG/20ML
85.0000 mg/m2 | Freq: Once | INTRAVENOUS | Status: AC
  Administered 2014-06-14: 135 mg via INTRAVENOUS
  Filled 2014-06-14: qty 27

## 2014-06-14 MED ORDER — DEXAMETHASONE SODIUM PHOSPHATE 10 MG/ML IJ SOLN
10.0000 mg | Freq: Once | INTRAMUSCULAR | Status: AC
  Administered 2014-06-14: 10 mg via INTRAVENOUS

## 2014-06-14 MED ORDER — ONDANSETRON 8 MG/50ML IVPB (CHCC)
8.0000 mg | Freq: Once | INTRAVENOUS | Status: AC
  Administered 2014-06-14: 8 mg via INTRAVENOUS

## 2014-06-14 MED ORDER — POTASSIUM CHLORIDE CRYS ER 20 MEQ PO TBCR: 20.0000 meq | EXTENDED_RELEASE_TABLET | Freq: Every day | ORAL | Status: DC

## 2014-06-14 MED ORDER — DEXAMETHASONE SODIUM PHOSPHATE 10 MG/ML IJ SOLN
INTRAMUSCULAR | Status: AC
  Filled 2014-06-14: qty 1

## 2014-06-14 NOTE — Progress Notes (Signed)
[facility] OFFICE PROGRESS NOTE   Diagnosis:  Esophagus cancer  INTERVAL HISTORY:   Mr. Mcsweeney returns as scheduled. He completed cycle 1 FOLFOX on 06/01/2014. He denies nausea/vomiting. No mouth sores. No diarrhea. Cold sensitivity lasted about 1 week. He has been constipated. He reports his last bowel movement was greater than one week ago. He is taking a stool softener and Ex-Lax. He reports good fluid intake. Appetite is poor. He does not have significant dysphagia. He continues to have lower chest/upper abdominal pain. Tramadol has been minimally effective.  Objective:  Vital signs in last 24 hours:  Blood pressure 116/81, pulse 92, temperature 97.8 F (36.6 C), temperature source Oral, resp. rate 18, height 5\' 11"  (1.803 m), weight 106 lb 4.8 oz (48.217 kg), SpO2 100 %.    HEENT: No thrush or ulcers. Resp: Lungs clear bilaterally. Cardio: Regular rate and rhythm. GI: Abdomen is soft. Bowel sounds active. Tender at the mid upper abdomen. No hepatomegaly. Vascular: No leg edema. Port-A-Cath without erythema.  Lab Results:  Lab Results  Component Value Date   WBC 6.2 06/14/2014   HGB 12.5* 06/14/2014   HCT 37.3* 06/14/2014   MCV 95.9 06/14/2014   PLT 210 06/14/2014   NEUTROABS 4.5 06/14/2014    Imaging:  No results found.  Medications: I have reviewed the patient's current medications.  Assessment/Plan: 1. 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  EGD by Dr. Collene Mares on 08/26/2013 with findings of a malignant appearing friable mass causing stenosis at 33 cm from the incisors extending to the GE junction. Stomach and the proximal small bowel were not visualized.   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   Staging PET scan on 09/03/2013 showed a 6.5 cm segment of intense hypermetabolic activity associated with the mid to distal esophagus with SUV max 24.5. There was no  evidence of mediastinal metastasis, gastrohepatic ligament nodal metastasis or liver metastasis.   Initiation of radiation 09/14/2013 and concurrent weekly Taxol/carboplatin on 09/15/2013. Cycle 5 Taxol/carboplatin 10/12/2013. Radiation completed 10/26/2013.   Restaging CTs 11/10/2013 with no evidence of metastatic disease and improvement in the esophagus wall thickening.  CT abdomen/pelvis 05/12/2014 with new extensive lymphadenopathy in the upper abdomen.  Cycle 1 FOLFOX 06/01/2014 2. Dysphagia secondary to #1. Initially improved, recurrent dysphasia October 2015 3. Weight loss secondary to #1. Improved. 4. Peripheral vascular disease. 5. History of mesenteric ischemia. 6. 4-5 mm hyperpigmented mole at the left upper back 7. Hypercalcemia status post Zometa 05/13/2014.    Disposition: Mr. Enfield appears stable. He has completed 1 cycle of FOLFOX. Plan to proceed with cycle 2 today as scheduled.  He is experiencing significant constipation. He will increase the stool softener to twice daily and began Miralax 17 g daily. He will contact the office if this is not effective within the next few days.  For the upper abdominal/lower chest pain tramadol has been minimally effective. He will begin hydrocodone 5/325 1-2 tablets every 6 hours as needed. He understands that he should not be driving or working while taking hydrocodone.  He has mild hypokalemia on labs today. He will begin Kdur 20 meq daily. He will return for a follow-up basic metabolic panel in one week.  He will return for a follow-up visit and cycle 3 FOLFOX in 2 weeks. He will contact the office in the interim as outlined above or with any other problems.    Ned Card ANP/GNP-BC   06/14/2014  10:24 AM

## 2014-06-14 NOTE — Patient Instructions (Signed)
Begin Miralax 17 grams daily for constipation Increase stool softener to twice daily

## 2014-06-14 NOTE — Patient Instructions (Signed)

## 2014-06-14 NOTE — Progress Notes (Signed)
[place] Work  Clinical Social Work was referred by Futures trader for assessment of psychosocial needs. Clinical Social Worker met with patient in the infusion room at [facility] to offer support and assess for needs. Patient expressed multiple concerns including work, Museum/gallery curator, and disability. CSW and patient discussed resources and support available at [facility].CSW and patient also discussed applying for Social Security Disability. CSW informed patient of the [facility] and the disability assistance program, and patient was agreeable to CSW making a referral. CSW provided contact information and encouraged patient to call with questions or concerns.     Clinical Social Work interventions: Conservation officer, nature referral    Brandon [name], MSW, LCSW, [place] Clinical Social Worker [place] [phone]

## 2014-06-14 NOTE — Telephone Encounter (Signed)
gv and printed appt sched and avs for pt for Jan and Feb.....sed added tx. °

## 2014-06-14 NOTE — Patient Instructions (Signed)
[facility] Discharge Instructions for Patients Receiving Chemotherapy  Today you received the following chemotherapy agents 5FU, Leucovorin and Oxaliplatin.  To help prevent nausea and vomiting after your treatment, we encourage you to take your nausea medication as prescribed.   If you develop nausea and vomiting that is not controlled by your nausea medication, call the clinic.   BELOW ARE SYMPTOMS THAT SHOULD BE REPORTED IMMEDIATELY:  *FEVER GREATER THAN 100.5 F  *CHILLS WITH OR WITHOUT FEVER  NAUSEA AND VOMITING THAT IS NOT CONTROLLED WITH YOUR NAUSEA MEDICATION  *UNUSUAL SHORTNESS OF BREATH  *UNUSUAL BRUISING OR BLEEDING  TENDERNESS IN MOUTH AND THROAT WITH OR WITHOUT PRESENCE OF ULCERS  *URINARY PROBLEMS  *BOWEL PROBLEMS  UNUSUAL RASH Items with * indicate a potential emergency and should be followed up as soon as possible.  Feel free to call the clinic you have any questions or concerns. The clinic phone number is [phone].

## 2014-06-14 NOTE — Progress Notes (Signed)
Nutrition follow-up completed with patient in chemotherapy for treatment of cancer of the distal esophagus. Patient has had continued weight loss with weight documented as 106.3 pounds January 4, down from 124.5 pounds October 1. Patient continues to meet criteria for severe malnutrition. Patient states some foods are hard to swallow and some are not. Patient unable to verbalize specific foods he has difficulty with. Patient reports liquids tend to be easier to swallow. Has not been drinking oral nutrition supplements as often. Patient reports constipation.  He is confused about regimen for constipation despite instructions today from physician. Patient also complains of nausea.  Nutrition diagnosis: Inadequate oral intake continues.  Intervention:  Recommended patient increase boost + to minimum of 4 bottles daily. Recommended patient consume frequent small meals and snacks choosing foods he tolerates. Referred patient back to nursing for clarification on strategies/medications for constipation. Educated patient on diet for constipation.  Encouraged increased fluids.  Provided fact sheet.  Monitoring, evaluation, goals: Patient will increase oral intake to minimize further weight loss.  Next visit: Monday, January 18, during chemotherapy.  **Disclaimer: This note was dictated with voice recognition software. Similar sounding words can inadvertently be transcribed and this note may contain transcription errors which may not have been corrected upon publication of note.**

## 2014-06-15 ENCOUNTER — Other Ambulatory Visit: Payer: Self-pay | Admitting: Gastroenterology

## 2014-06-16 ENCOUNTER — Ambulatory Visit (HOSPITAL_BASED_OUTPATIENT_CLINIC_OR_DEPARTMENT_OTHER): Payer: 59

## 2014-06-16 DIAGNOSIS — C155 Malignant neoplasm of lower third of esophagus: Secondary | ICD-10-CM

## 2014-06-16 MED ORDER — SODIUM CHLORIDE 0.9 % IJ SOLN
10.0000 mL | INTRAMUSCULAR | Status: DC | PRN
  Administered 2014-06-16: 10 mL
  Filled 2014-06-16: qty 10

## 2014-06-16 MED ORDER — HEPARIN SOD (PORK) LOCK FLUSH 100 UNIT/ML IV SOLN
500.0000 [IU] | Freq: Once | INTRAVENOUS | Status: AC | PRN
  Administered 2014-06-16: 500 [IU]
  Filled 2014-06-16: qty 5

## 2014-06-16 NOTE — Progress Notes (Signed)
Pt in for pump d/c today, pt was programmed for 46 hr pump, to be disconnected at approx 230pm today 04/16/14. Pt stated upon arriving that his pump beeped last night at about 930pm, pt states he thought it was just the battery so he didn't call. Patients pump was already stopped. Turned pump on, pt had 71ml left of 26ml. Informed Rosalio Macadamia, RN, who `restarted it until we could speak with Dr. Benay Spice, [place] with Dr.Sherill who stated to go ahead and waste the remaining chemo and d/c pump. Pump was d/c'd with 21ml left in bag, pt flushed and deaccessed, reeducated pt on calling the [place] when his pump beeps like that again. Pt verbalized all understanding and denies any concerns or questions at this time. Pump was cleaned and put in biohazard bag and taken to [place], RN to be checked.

## 2014-06-16 NOTE — Patient Instructions (Signed)

## 2014-06-18 ENCOUNTER — Ambulatory Visit (HOSPITAL_COMMUNITY)
Admission: RE | Admit: 2014-06-18 | Discharge: 2014-06-18 | Disposition: A | Discharge status: Home / Self Care | Payer: 59 | Source: Ambulatory Visit | Attending: Gastroenterology | Admitting: Gastroenterology

## 2014-06-18 ENCOUNTER — Encounter (HOSPITAL_COMMUNITY): Payer: Self-pay | Admitting: *Deleted

## 2014-06-18 ENCOUNTER — Encounter (HOSPITAL_COMMUNITY)
Admission: RE | Disposition: A | Discharge status: Home / Self Care | Payer: 59 | Source: Ambulatory Visit | Attending: Gastroenterology

## 2014-06-18 DIAGNOSIS — C159 Malignant neoplasm of esophagus, unspecified: Secondary | ICD-10-CM | POA: Insufficient documentation

## 2014-06-18 DIAGNOSIS — Z8501 Personal history of malignant neoplasm of esophagus: Secondary | ICD-10-CM | POA: Insufficient documentation

## 2014-06-18 DIAGNOSIS — E8809 Other disorders of plasma-protein metabolism, not elsewhere classified: Secondary | ICD-10-CM | POA: Insufficient documentation

## 2014-06-18 DIAGNOSIS — E860 Dehydration: Secondary | ICD-10-CM | POA: Diagnosis not present

## 2014-06-18 DIAGNOSIS — K222 Esophageal obstruction: Secondary | ICD-10-CM | POA: Insufficient documentation

## 2014-06-18 DIAGNOSIS — I771 Stricture of artery: Secondary | ICD-10-CM | POA: Diagnosis not present

## 2014-06-18 DIAGNOSIS — Z923 Personal history of irradiation: Secondary | ICD-10-CM | POA: Insufficient documentation

## 2014-06-18 DIAGNOSIS — Z720 Tobacco use: Secondary | ICD-10-CM | POA: Diagnosis not present

## 2014-06-18 DIAGNOSIS — F1010 Alcohol abuse, uncomplicated: Secondary | ICD-10-CM | POA: Diagnosis not present

## 2014-06-18 DIAGNOSIS — R4702 Dysphasia: Secondary | ICD-10-CM | POA: Insufficient documentation

## 2014-06-18 HISTORY — PX: BALLOON DILATION: SHX5330

## 2014-06-18 HISTORY — PX: ESOPHAGOGASTRODUODENOSCOPY: SHX5428

## 2014-06-18 SURGERY — EGD (ESOPHAGOGASTRODUODENOSCOPY)
Anesthesia: Moderate Sedation

## 2014-06-18 MED ORDER — FENTANYL CITRATE 0.05 MG/ML IJ SOLN
INTRAMUSCULAR | Status: AC
  Filled 2014-06-18: qty 2

## 2014-06-18 MED ORDER — MIDAZOLAM HCL 10 MG/2ML IJ SOLN
INTRAMUSCULAR | Status: DC | PRN
  Administered 2014-06-18 (×2): 2 mg via INTRAVENOUS
  Administered 2014-06-18: 1 mg via INTRAVENOUS

## 2014-06-18 MED ORDER — SODIUM CHLORIDE 0.9 % IV SOLN: INTRAVENOUS | Status: DC

## 2014-06-18 MED ORDER — FENTANYL CITRATE 0.05 MG/ML IJ SOLN
INTRAMUSCULAR | Status: DC | PRN
  Administered 2014-06-18 (×3): 25 ug via INTRAVENOUS

## 2014-06-18 MED ORDER — MIDAZOLAM HCL 10 MG/2ML IJ SOLN
INTRAMUSCULAR | Status: AC
  Filled 2014-06-18: qty 2

## 2014-06-18 NOTE — Interval H&P Note (Signed)
History and Physical Interval Note:  06/18/2014 8:55 AM  Brandon [name]  has presented today for surgery, with the diagnosis of dysphagia.  He was diagnosed early 2015 after having issues with dysphagia x 6 months.  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  He was not a surgical candidate, per Dr. Servando Snare, and he has only received XRT/Chemotherapy.  Currently he is receiving another course of chemotherapy.  He has had issues with dysphagia to solids and liquids.    The various methods of treatment have been discussed with the patient and family. After consideration of risks, benefits and other options for treatment, the patient has consented to  Procedure(s): ESOPHAGOGASTRODUODENOSCOPY (EGD) (N/A) BALLOON DILATION (N/A) as a surgical intervention .  The patient's history has been reviewed, patient examined, no change in status, stable for surgery.  I have reviewed the patient's chart and labs.  Questions were answered to the patient's satisfaction.     [name] D

## 2014-06-18 NOTE — Op Note (Signed)
[facility] [place], [id number]   ENDOSCOPY PROCEDURE REPORT  PATIENT: Brandon [name], Brandon [name]  MR#: [id number] BIRTHDATE: Jun 18, 1955 , 58  yrs. old GENDER: male ENDOSCOPIST:Meeyah Ovitt Benson [place], MD REFERRED BY: PROCEDURE DATE:  06/25/14 PROCEDURE:   EGD w/ balloon dilation ASA CLASS:    Class III INDICATIONS: Dysphagia MEDICATION: Fentanyl 50 mcg IV and Versed 4 mg IV TOPICAL ANESTHETIC:   none  DESCRIPTION OF PROCEDURE:   After the risks and benefits of the procedure were explained, informed consent was obtained.  The Pentax Gastroscope [id number]  endoscope was introduced through the mouth  and advanced to the second portion of the duodenum .  The instrument was slowly withdrawn as the mucosa was fully examined.   FINDINGS: At 35 cm a 3 cm stricture was identified.  The adult endoscope was not able to pass through the area, but the lumen was visualized distally.  Balloon dilation was performed starting at 12 mm and then up to 13.5 mm.  The endoscope was able to pass through the area, but it was still stenosed.  There was moderate difficulty with passage of the endoscope through this area.  Further dilation at 15 mm was performed, which allowed for easy passage of the endoscope.  The mucosal distal to this site was irregular, but no overt evidence of malignancy.  It appeared to be more consistent with reflux changes.  The gastric lumen was normal as wella s the proximal duodenum.    Retroflexed views revealed no abnormalities. The scope was then withdrawn from the patient and the procedure completed.  Post procedure check was negative for crepitus. COMPLICATIONS: There were no immediate complications.  ENDOSCOPIC IMPRESSION: 1) Benign stricture secondary to scarring from 35-38 cm.  RECOMMENDATIONS: 1) Follow up with Dr. Collene Mares in 2 weeks. 2) Pending response a repeat EGD may be required. _______________________________ eSigned:  Carol Ada, MD 06/25/14  9:58 AM   cc: CPT CODES: ICD CODES:  The ICD and CPT codes recommended by this software are interpretations from the data that the clinical staff has captured with the software.  The verification of the translation of this report to the ICD and CPT codes and modifiers is the sole responsibility of the health care institution and practicing physician where this report was generated.  [employer] will not be held responsible for the validity of the ICD and CPT codes included on this report.  AMA assumes no liability for data contained or not contained herein. CPT is a Designer, television/film set of the [employer].

## 2014-06-18 NOTE — H&P (View-Only) (Signed)
will   SYMPTOM MANAGEMENT CLINIC   HPI: Brandon [name] 59 y.o. male diagnosed with esophageal cancer.  Patient is status post carboplatin/paclitaxel chemotherapy regimen which was completed in May 2015.  He completed radiation treatments in May 2015 as well.  He has been undergoing active observation only since that time.  Patient presented to the emergency department approximately one week ago with complaint of increased anorexia, nausea/vomiting, and generalized weakness.  He was experiencing worsening dysphagia with specific foods.  He feels he has lost a good deal of weight recently as well.  Patient has had esophageal dilatation in the past per Dr. Tammi Klippel gastroenterologist; and patient feels he may very well need a repeat dilatation.  Patient feels fairly dehydrated today.  He received IV fluid rehydration last week as well; which did help him feel slightly better.  Patient states that he is unable to have Port-A-Cath insertion until 05/27/2014.  We will therefore have to postpone initiation of his FOLFOX chemotherapy regimen until 06/01/2014.  Also, patient has spoken with our [place] worker in regards to financial and transportation issues.  He states he has not been able to meet with the nutritionist yet.  He did also speak with the financial counselor; and is brought in his financial records per request.   HPI  CURRENT THERAPY: Upcoming Treatment Dates - COLORECTAL FOLFOX q14d Days with orders from any treatment category:  05/19/2014      SCHEDULING COMMUNICATION      ondansetron (ZOFRAN) IVPB 8 mg      dexamethasone (DECADRON) injection 10 mg      oxaliplatin (ELOXATIN) 135 mg in dextrose 5 % 500 mL chemo infusion      leucovorin 644 mg in dextrose 5 % 250 mL infusion      fluorouracil (ADRUCIL) chemo injection 650 mg      fluorouracil (ADRUCIL) 3,850 mg in sodium chloride 0.9 % 150 mL chemo infusion      sodium chloride 0.9 % injection 10 mL      heparin lock  flush 100 unit/mL      heparin lock flush 100 unit/mL      alteplase (CATHFLO ACTIVASE) injection 2 mg      sodium chloride 0.9 % injection 3 mL      Cold Pack 1 packet      Hot Pack 1 packet      dextrose 5 % solution      TREATMENT CONDITIONS 05/21/2014      SCHEDULING COMMUNICATION      sodium chloride 0.9 % injection 10 mL      heparin lock flush 100 unit/mL      heparin lock flush 100 unit/mL      alteplase (CATHFLO ACTIVASE) injection 2 mg      sodium chloride 0.9 % injection 3 mL      Cold Pack 1 packet 06/02/2014      SCHEDULING COMMUNICATION      ondansetron (ZOFRAN) IVPB 8 mg      dexamethasone (DECADRON) injection 10 mg      oxaliplatin (ELOXATIN) 135 mg in dextrose 5 % 500 mL chemo infusion      leucovorin 644 mg in dextrose 5 % 250 mL infusion      fluorouracil (ADRUCIL) chemo injection 650 mg      fluorouracil (ADRUCIL) 3,850 mg in sodium chloride 0.9 % 150 mL chemo infusion      sodium chloride 0.9 % injection 10 mL  heparin lock flush 100 unit/mL      heparin lock flush 100 unit/mL      alteplase (CATHFLO ACTIVASE) injection 2 mg      sodium chloride 0.9 % injection 3 mL      Cold Pack 1 packet      Hot Pack 1 packet      dextrose 5 % solution      TREATMENT CONDITIONS    ROS  Past Medical History  Diagnosis Date  . Cancer 08/26/13    esophageal path pending  . ETOH abuse   . Tobacco abuse   . 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 History of aorta-iliac-femoral bypass   . History of radiation therapy 09/14/13-10/26/13    esophageal ca/50.4Gy/28fx    Past Surgical History  Procedure Laterality Date  . Cardiac surgery      femoral bypass surgery  . Visceral angiogram N/A 11/25/2013    Procedure: VISCERAL ANGIOGRAM;  Surgeon: Vance W Brabham, MD;  Location: [facility] CATH LAB;  Service: Cardiovascular;  Laterality: N/A;    has Malignant neoplasm of lower third of esophagus; S/P aorto-bifemoral bypass surgery; Chronic  mesenteric arterial insufficiency syndrome; History of aorta-iliac-femoral bypass; ETOH abuse; Tobacco abuse; Thoracoabdominal aortic aneurysm, without rupture; Mesenteric artery insufficiency; AAA (abdominal aortic aneurysm) without rupture; Celiac artery stenosis; Hypercalcemia; Anorexia; Nausea with vomiting; Constipation; Weakness; Dehydration; Hypoalbuminemia; Financial difficulties; Neoplasm related pain; and Dysphagia on his problem list.     is allergic to nsaids.    Medication List       This list is accurate as of: 05/19/14 11:59 PM.  Always use your most recent med list.               CARAFATE 1 GM/10ML suspension  Generic drug:  sucralfate  Take 1 g by mouth 2 (two) times daily.     lidocaine-prilocaine cream  Commonly known as:  EMLA  Apply 1 application topically as needed. Apply approx 30 minutes prior to accessing port.     prochlorperazine 10 MG tablet  Commonly known as:  COMPAZINE  Take 1 tablet (10 mg total) by mouth every 6 (six) hours as needed for nausea or vomiting.     promethazine 25 MG tablet  Commonly known as:  PHENERGAN  Take 1 tablet (25 mg total) by mouth every 6 (six) hours as needed for nausea or vomiting.     traMADol 50 MG tablet  Commonly known as:  ULTRAM  Take 1 tablet (50 mg total) by mouth every 6 (six) hours as needed.         PHYSICAL EXAMINATION  Blood pressure 123/85, pulse 98, temperature 98.3 F (36.8 C), temperature source Oral, resp. rate 17, height 5' 11" (1.803 m), weight 112 lb (50.803 kg).  Physical Exam  Constitutional: He is oriented to person, place, and time. Vital signs are normal. He appears dehydrated. He appears unhealthy. He appears cachectic.  HENT:  Head: Normocephalic and atraumatic.  Mouth/Throat: Oropharynx is clear and moist.  Eyes: Conjunctivae and EOM are normal. Pupils are equal, round, and reactive to light. Right eye exhibits no discharge. Left eye exhibits no discharge. No scleral icterus.  Neck:  Normal range of motion. Neck supple. No JVD present. No tracheal deviation present. No thyromegaly present.  Cardiovascular: Normal rate, regular rhythm, normal heart sounds and intact distal pulses.   Pulmonary/Chest: Effort normal and breath sounds normal. No respiratory distress. He has no wheezes. He has no   rales. He exhibits no tenderness.  Abdominal: Soft. Bowel sounds are normal. He exhibits no distension and no mass. There is no tenderness. There is no rebound and no guarding.  Musculoskeletal: Normal range of motion. He exhibits no edema.  Lymphadenopathy:    He has no cervical adenopathy.  Neurological: He is alert and oriented to person, place, and time. Gait normal.  Skin: Skin is warm and dry. No rash noted. No erythema.  Psychiatric: Affect normal.  Nursing note and vitals reviewed.   LABORATORY DATA:. Appointment on 05/19/2014  Component Date Value Ref Range Status  . Sodium 05/19/2014 139  136 - 145 mEq/L Final  . Potassium 05/19/2014 3.7  3.5 - 5.1 mEq/L Final  . Chloride 05/19/2014 107  98 - 109 mEq/L Final  . CO2 05/19/2014 21* 22 - 29 mEq/L Final  . Glucose 05/19/2014 112  70 - 140 mg/dl Final  . BUN 05/19/2014 5.3* 7.0 - 26.0 mg/dL Final  . Creatinine 05/19/2014 0.6* 0.7 - 1.3 mg/dL Final  . Total Bilirubin 05/19/2014 0.41  0.20 - 1.20 mg/dL Final  . Alkaline Phosphatase 05/19/2014 111  40 - 150 U/L Final  . AST 05/19/2014 16  5 - 34 U/L Final  . ALT 05/19/2014 8  0 - 55 U/L Final  . Total Protein 05/19/2014 6.7  6.4 - 8.3 g/dL Final  . Albumin 05/19/2014 2.8* 3.5 - 5.0 g/dL Final  . Calcium 05/19/2014 9.0  8.4 - 10.4 mg/dL Final  . Anion Gap 05/19/2014 11  3 - 11 mEq/L Final  . EGFR 05/19/2014 >90  >90 ml/min/1.73 m2 Final   eGFR is calculated using the CKD-EPI Creatinine Equation (2009)     RADIOGRAPHIC STUDIES: No results found.  ASSESSMENT/PLAN:    Anorexia Patient continues to complain of some chronic/worsening dysphasia and decreased appetite.   Patient is able to tolerate/swallow liquids; but is having worsening difficulty swallowing solid foods.  Had previously ordered a nutrition consult; the patient has not yet met with nutritionist.  We'll follow-up and confirm nutritionist appointment.  Dehydration Patient states that he's taking in very little oral intake due to dysphagia.  Patient will receive 1 L normal saline IV fluid rehydration today for his chronic dehydration.  He was also encouraged to push fluids is much as possible.  Dysphagia Patient continues to complain of some chronic/worsening dysphagia.  Most likely this is secondary to previous radiation-induced esophageal stricture.  Advised patient would assist him in obtaining a follow-up appointment for further evaluation per gastroenterologist Dr. Mann.  Hypercalcemia Patient was diagnosed last week with hypercalcemia.  Patient receive Zometa 4 mg to correct the calcium level.  Corrected calcium today is 9.96.  Will continue to monitor closely.  Hypoalbuminemia Albumin is decreased today.  Pt encouraged to push protein as much as possible.     Malignant neoplasm of lower third of esophagus Pt is status post carboplatin/paclitaxel last received 10/12/2013.  He completed radiation tx 10/26/13.  CT scan obtained recently while in the ED revealed progression of disease.  After review of scan results with patient; decision was made to initiate FOLFOX chemotherapy regimen this coming Wednesday 05/31/2014.  Prior to initiating his new chemotherapy regimen-patient will need a Port-A-Cath placed.  He was also given EMLA cream to apply to new Port-A-Cath site prior to Port-A-Cath access. Patient will have Port-A-Cath placement on 05/27/14.     Patient stated understanding of all instructions; and was in agreement with this plan of care. The patient knows to call the   clinic with any problems, questions or concerns.   This was a shared visit with Dr. Benay Spice today.  Dr. Benay Spice briefly  reviewed all FOLFOX chemotherapy regimen including administration, side effects, and risks.  Total time spent with patient was 40 minutes;  with greater than 75 percent of that time spent in face to face counseling regarding his symptoms, and coordination of care and follow up.  Disclaimer: This note was dictated with voice recognition software. Similar sounding words can inadvertently be transcribed and may not be corrected upon review.   Drue Second, NP 05/20/2014   This was a shared visit with Drue Second. I discussed treatment options with Mr. Yu. I recommend FOLFOX chemotherapy. We reviewed the potential toxicities associated with this regimen including the chance for nausea/vomiting, mucositis, diarrhea, hematologic toxicity, and an allergic reaction. We reviewed the rash, hyperpigmentation, and hand/foot syndrome associated with 5 fluorouracil. We discussed the various types of neuropathy seen with oxaliplatin. He agrees to proceed.  We will refer him to Dr. Collene Mares for further evaluation of the dysphagia.  Julieanne Manson, M.D.

## 2014-06-18 NOTE — Discharge Instructions (Signed)
Esophagogastroduodenoscopy °Care After °Refer to this sheet in the next few weeks. These instructions provide you with information on caring for yourself after your procedure. Your caregiver may also give you more specific instructions. Your treatment has been planned according to current medical practices, but problems sometimes occur. Call your caregiver if you have any problems or questions after your procedure.  °HOME CARE INSTRUCTIONS °· Do not eat or drink anything until the numbing medicine (local anesthetic) has worn off and your gag reflex has returned. You will know that the local anesthetic has worn off when you can swallow comfortably. °· Do not drive for 12 hours after the procedure or as directed by your caregiver. °· Only take medicines as directed by your caregiver. °SEEK MEDICAL CARE IF:  °· You cannot stop coughing. °· You are not urinating at all or less than usual. °SEEK IMMEDIATE MEDICAL CARE IF: °· You have difficulty swallowing. °· You cannot eat or drink. °· You have worsening throat or chest pain. °· You have dizziness, lightheadedness, or you faint. °· You have nausea or vomiting. °· You have chills. °· You have a fever. °· You have severe abdominal pain. °· You have black, tarry, or bloody stools. °Document Released: 05/14/2012 Document Reviewed: 05/14/2012 °ExitCare® Patient Information ©2015 [employer]. This information is not intended to replace advice given to you by your health care provider. Make sure you discuss any questions you have with your health care provider. ° ° °Conscious Sedation, Adult, Care After °Refer to this sheet in the next few weeks. These instructions provide you with information on caring for yourself after your procedure. Your health care provider may also give you more specific instructions. Your treatment has been planned according to current medical practices, but problems sometimes occur. Call your health care provider if you have any problems or  questions after your procedure. °WHAT TO EXPECT AFTER THE PROCEDURE  °After your procedure: °· You may feel sleepy, clumsy, and have poor balance for several hours. °· Vomiting may occur if you eat too soon after the procedure. °HOME CARE INSTRUCTIONS °· Do not participate in any activities where you could become injured for at least 24 hours. Do not: °¨ Drive. °¨ Swim. °¨ Ride a bicycle. °¨ Operate heavy machinery. °¨ Cook. °¨ Use power tools. °¨ Climb ladders. °¨ Work from a high place. °· Do not make important decisions or sign legal documents until you are improved. °· If you vomit, drink water, juice, or soup when you can drink without vomiting. Make sure you have little or no nausea before eating solid foods. °· Only take over-the-counter or prescription medicines for pain, discomfort, or fever as directed by your health care provider. °· Make sure you and your family fully understand everything about the medicines given to you, including what side effects may occur. °· You should not drink alcohol, take sleeping pills, or take medicines that cause drowsiness for at least 24 hours. °· If you smoke, do not smoke without supervision. °· If you are feeling better, you may resume normal activities 24 hours after you were sedated. °· Keep all appointments with your health care provider. °SEEK MEDICAL CARE IF: °· Your skin is pale or bluish in color. °· You continue to feel nauseous or vomit. °· Your pain is getting worse and is not helped by medicine. °· You have bleeding or swelling. °· You are still sleepy or feeling clumsy after 24 hours. °SEEK IMMEDIATE MEDICAL CARE IF: °· You develop a   rash. °· You have difficulty breathing. °· You develop any type of allergic problem. °· You have a fever. °MAKE SURE YOU: °· Understand these instructions. °· Will watch your condition. °· Will get help right away if you are not doing well or get worse. °Document Released: 03/18/2013 Document Reviewed: 03/18/2013 °ExitCare®  Patient Information ©2015 [employer]. This information is not intended to replace advice given to you by your health care provider. Make sure you discuss any questions you have with your health care provider. ° °

## 2014-06-21 ENCOUNTER — Encounter (HOSPITAL_COMMUNITY): Payer: Self-pay | Admitting: Gastroenterology

## 2014-06-23 ENCOUNTER — Encounter: Payer: Self-pay | Admitting: Vascular Surgery

## 2014-06-24 ENCOUNTER — Ambulatory Visit (INDEPENDENT_AMBULATORY_CARE_PROVIDER_SITE_OTHER): Payer: 59 | Admitting: Vascular Surgery

## 2014-06-24 ENCOUNTER — Ambulatory Visit
Admission: RE | Admit: 2014-06-24 | Discharge: 2014-06-24 | Disposition: A | Discharge status: Home / Self Care | Payer: 59 | Source: Ambulatory Visit | Attending: Vascular Surgery | Admitting: Vascular Surgery

## 2014-06-24 VITALS — BP 148/94 | HR 82 | Temp 97.4°F | Resp 18 | Ht 71.0 in | Wt 107.0 lb

## 2014-06-24 DIAGNOSIS — I712 Thoracic aortic aneurysm, without rupture, unspecified: Secondary | ICD-10-CM

## 2014-06-24 DIAGNOSIS — Z01810 Encounter for preprocedural cardiovascular examination: Secondary | ICD-10-CM

## 2014-06-24 DIAGNOSIS — I716 Thoracoabdominal aortic aneurysm, without rupture, unspecified: Secondary | ICD-10-CM

## 2014-06-24 DIAGNOSIS — K551 Chronic vascular disorders of intestine: Secondary | ICD-10-CM

## 2014-06-24 DIAGNOSIS — I708 Atherosclerosis of other arteries: Secondary | ICD-10-CM

## 2014-06-24 DIAGNOSIS — Z48812 Encounter for surgical aftercare following surgery on the circulatory system: Secondary | ICD-10-CM

## 2014-06-24 DIAGNOSIS — K550 Acute vascular disorders of intestine: Secondary | ICD-10-CM

## 2014-06-24 MED ORDER — IOHEXOL 350 MG/ML SOLN
80.0000 mL | Freq: Once | INTRAVENOUS | Status: AC | PRN
  Administered 2014-06-24: 80 mL via INTRAVENOUS

## 2014-06-24 NOTE — Progress Notes (Signed)
Brandon [name] is a 59 y.o. ([birth year]) male with a known thoracoabdominal aortic aneurysm He is status post aorto-bifemoral bypass and superior mesenteric artery bypass surgery in 2009. He has known esophageal cancer and most recently his CT scan suggested mesenteric lymph node involvement in the upper abdomen.  He recently underwent esophageal dilation for dysphagia. He has had 40 pound weight loss in the last year.  Review of systems: He denies chest pain. He denies shortness of breath. He states his dysphagia is improved. The patient denies significant weight loss and says his weight has been going up and down.   Past Medical History  Diagnosis Date  . Cancer 08/26/13    esophageal path pending  . ETOH abuse   . Tobacco abuse   . 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 History of aorta-iliac-femoral bypass   . History of radiation therapy 09/14/13-10/26/13    esophageal ca/50.4Gy/14fx   Past Surgical History  Procedure Laterality Date  . Cardiac surgery      femoral bypass surgery  . Visceral angiogram N/A 11/25/2013    Procedure: VISCERAL ANGIOGRAM;  Surgeon: Serafina Mitchell, MD;  Location: [facility] CATH LAB;  Service: Cardiovascular;  Laterality: N/A;  . Esophagogastroduodenoscopy N/A 06/18/2014    Procedure: ESOPHAGOGASTRODUODENOSCOPY (EGD);  Surgeon: Beryle Beams, MD;  Location: [facility] ENDOS[facility];  Service: Endoscopy;  Laterality: N/A;  . Balloon dilation N/A 06/18/2014    Procedure: BALLOON DILATION;  Surgeon: Beryle Beams, MD;  Location: [facility] ENDOSCOPY;  Service: Endoscopy;  Laterality: N/A;   aortobifemoral bypass with SMA bypass      Physical Examination        Filed Vitals:   06/24/14 1412  BP: 148/94  Pulse: 82  Temp: 97.4 F (36.3 C)  TempSrc: Oral  Resp: 18  Height: 5\' 11"  (1.803 m)  Weight: 107 lb (48.535 kg)  SpO2: 99%   General: A&O x 3, Thin male in NAD    Pulmonary: Sym exp, good air movt, CTAB, no rales, rhonchi, &  wheezing  Cardiac: RRR, Nl S1, S2, no Murmurs, rubs or gallops, No carotid bruits   Abdomen: Soft nontender 2+ femoral pulses bilaterally no bruits no hernia  CT Angio chest abdomen pelvis images were reviewed today. He has had interval increase in the bulkiness of the metastatic lymph nodes in the upper abdomen over the last month. His descending thoracic aorta is approximate 5.8 cm in diameter with no evidence of rupture. His infrarenal aortic graft is patent with a widely patent SMA bypass graft.  Assessment:   5.8 cm thoracoabdominal aneurysm no evidence of rupture. The patient has what appears to be progressive metastatic esophageal cancer.  Plan: Follow-up CT scan of the chest abdomen and pelvis unless he has continued progression of his esophageal cancer. If that is the case then no further CT scans for his aorta would be necessary as most likely he is never going to be a candidate for branch graft repair as long as his cancer is not controlled. He is certainly not a candidate for open aneurysm repair ever.   Ruta Hinds, MD [facility] Office: [phone] Pager: [phone]

## 2014-06-25 NOTE — Addendum Note (Signed)
Addended by: Mena Goes on: 06/25/2014 09:08 AM   Modules accepted: Orders

## 2014-06-27 ENCOUNTER — Other Ambulatory Visit: Payer: Self-pay | Admitting: Oncology

## 2014-06-28 ENCOUNTER — Ambulatory Visit (HOSPITAL_BASED_OUTPATIENT_CLINIC_OR_DEPARTMENT_OTHER): Payer: 59 | Admitting: Nurse Practitioner

## 2014-06-28 ENCOUNTER — Ambulatory Visit: Payer: 59 | Admitting: Nutrition

## 2014-06-28 ENCOUNTER — Encounter: Payer: Self-pay | Admitting: Nurse Practitioner

## 2014-06-28 ENCOUNTER — Ambulatory Visit (HOSPITAL_BASED_OUTPATIENT_CLINIC_OR_DEPARTMENT_OTHER): Payer: 59

## 2014-06-28 ENCOUNTER — Other Ambulatory Visit (HOSPITAL_BASED_OUTPATIENT_CLINIC_OR_DEPARTMENT_OTHER): Payer: 59

## 2014-06-28 VITALS — BP 132/86 | HR 96 | Temp 98.5°F | Resp 18 | Ht 71.0 in | Wt 106.0 lb

## 2014-06-28 DIAGNOSIS — G893 Neoplasm related pain (acute) (chronic): Secondary | ICD-10-CM

## 2014-06-28 DIAGNOSIS — C155 Malignant neoplasm of lower third of esophagus: Secondary | ICD-10-CM

## 2014-06-28 DIAGNOSIS — K5900 Constipation, unspecified: Secondary | ICD-10-CM

## 2014-06-28 DIAGNOSIS — E8809 Other disorders of plasma-protein metabolism, not elsewhere classified: Secondary | ICD-10-CM

## 2014-06-28 DIAGNOSIS — Z5111 Encounter for antineoplastic chemotherapy: Secondary | ICD-10-CM

## 2014-06-28 DIAGNOSIS — R1310 Dysphagia, unspecified: Secondary | ICD-10-CM

## 2014-06-28 DIAGNOSIS — R630 Anorexia: Secondary | ICD-10-CM

## 2014-06-28 LAB — CBC WITH DIFFERENTIAL/PLATELET
BASO%: 0.8 % (ref 0.0–2.0)
BASOS ABS: 0.1 10*3/uL (ref 0.0–0.1)
EOS ABS: 0 10*3/uL (ref 0.0–0.5)
EOS%: 0.3 % (ref 0.0–7.0)
HCT: 41.9 % (ref 38.4–49.9)
HEMOGLOBIN: 13.6 g/dL (ref 13.0–17.1)
LYMPH%: 9.9 % — ABNORMAL LOW (ref 14.0–49.0)
MCH: 32.1 pg (ref 27.2–33.4)
MCHC: 32.5 g/dL (ref 32.0–36.0)
MCV: 98.7 fL — AB (ref 79.3–98.0)
MONO#: 0.8 10*3/uL (ref 0.1–0.9)
MONO%: 9.6 % (ref 0.0–14.0)
NEUT#: 6.9 10*3/uL — ABNORMAL HIGH (ref 1.5–6.5)
NEUT%: 79.4 % — ABNORMAL HIGH (ref 39.0–75.0)
Platelets: 211 10*3/uL (ref 140–400)
RBC: 4.25 10*6/uL (ref 4.20–5.82)
RDW: 15 % — ABNORMAL HIGH (ref 11.0–14.6)
WBC: 8.8 10*3/uL (ref 4.0–10.3)
lymph#: 0.9 10*3/uL (ref 0.9–3.3)

## 2014-06-28 LAB — COMPREHENSIVE METABOLIC PANEL (CC13)
ALBUMIN: 2.8 g/dL — AB (ref 3.5–5.0)
ALK PHOS: 112 U/L (ref 40–150)
AST: 14 U/L (ref 5–34)
Anion Gap: 9 mEq/L (ref 3–11)
BILIRUBIN TOTAL: 0.34 mg/dL (ref 0.20–1.20)
BUN: 8 mg/dL (ref 7.0–26.0)
CALCIUM: 10 mg/dL (ref 8.4–10.4)
CO2: 26 meq/L (ref 22–29)
Chloride: 105 mEq/L (ref 98–109)
Creatinine: 0.8 mg/dL (ref 0.7–1.3)
EGFR: >90 mL/min/{1.73_m2} (ref >90)
Glucose: 128 mg/dl (ref 70–140)
Potassium: 4.4 mEq/L (ref 3.5–5.1)
SODIUM: 140 meq/L (ref 136–145)
Total Protein: 6.6 g/dL (ref 6.4–8.3)

## 2014-06-28 MED ORDER — OXYCODONE HCL 5 MG PO TABS: 5.0000 mg | ORAL_TABLET | Freq: Four times a day (QID) | ORAL | Status: DC | PRN

## 2014-06-28 MED ORDER — ONDANSETRON 8 MG/NS 50 ML IVPB
INTRAVENOUS | Status: AC
  Filled 2014-06-28: qty 8

## 2014-06-28 MED ORDER — DEXAMETHASONE SODIUM PHOSPHATE 10 MG/ML IJ SOLN
INTRAMUSCULAR | Status: AC
  Filled 2014-06-28: qty 1

## 2014-06-28 MED ORDER — OXALIPLATIN CHEMO INJECTION 100 MG/20ML
85.0000 mg/m2 | Freq: Once | INTRAVENOUS | Status: AC
  Administered 2014-06-28: 135 mg via INTRAVENOUS
  Filled 2014-06-28: qty 27

## 2014-06-28 MED ORDER — SODIUM CHLORIDE 0.9 % IV SOLN
2400.0000 mg/m2 | INTRAVENOUS | Status: DC
  Administered 2014-06-28: 3850 mg via INTRAVENOUS
  Filled 2014-06-28: qty 77

## 2014-06-28 MED ORDER — ONDANSETRON 8 MG PO TBDP: 8.0000 mg | ORAL_TABLET | Freq: Three times a day (TID) | ORAL | Status: DC | PRN

## 2014-06-28 MED ORDER — DEXTROSE 5 % IV SOLN
Freq: Once | INTRAVENOUS | Status: AC
  Administered 2014-06-28: 14:00:00 via INTRAVENOUS

## 2014-06-28 MED ORDER — FLUOROURACIL CHEMO INJECTION 2.5 GM/50ML
400.0000 mg/m2 | Freq: Once | INTRAVENOUS | Status: AC
Start: 2014-06-28 — End: 2014-06-28
  Administered 2014-06-28: 650 mg via INTRAVENOUS
  Filled 2014-06-28: qty 13

## 2014-06-28 MED ORDER — ONDANSETRON 8 MG/50ML IVPB (CHCC)
8.0000 mg | Freq: Once | INTRAVENOUS | Status: AC
  Administered 2014-06-28: 8 mg via INTRAVENOUS

## 2014-06-28 MED ORDER — SODIUM CHLORIDE 0.9 % IJ SOLN
10.0000 mL | INTRAMUSCULAR | Status: DC | PRN
  Filled 2014-06-28: qty 10

## 2014-06-28 MED ORDER — DEXAMETHASONE SODIUM PHOSPHATE 10 MG/ML IJ SOLN
10.0000 mg | Freq: Once | INTRAMUSCULAR | Status: AC
  Administered 2014-06-28: 10 mg via INTRAVENOUS

## 2014-06-28 MED ORDER — DEXTROSE 5 % IV SOLN
400.0000 mg/m2 | Freq: Once | INTRAVENOUS | Status: AC
  Administered 2014-06-28: 644 mg via INTRAVENOUS
  Filled 2014-06-28: qty 32.2

## 2014-06-28 NOTE — Assessment & Plan Note (Signed)
Pt reports that his dysphagia is stable at present. Will continue to monitor.

## 2014-06-28 NOTE — Assessment & Plan Note (Addendum)
Albumin remains low.  Advised pt to push protein in his diet as much as possible.

## 2014-06-28 NOTE — Progress Notes (Signed)
SYMPTOM MANAGEMENT CLINIC   HPI: Brandon [name] 59 y.o. male diagnosed with esophageal cancer.  Currently undergoing FOLFOX chemotherapy regimen.  Patient presented to the [facility] today to receive cycle 3 of his FOLFOX chemotherapy.  Patient states he's tolerating his chemotherapy fairly well.  He denies any nausea, vomiting, diarrhea, or fever/chills.  He does complain of chronic pain to his lower chest and upper abdomen area.  He states he is try tramadol; but has not worked for him.  He states that he is on most of his hydrocodone; and with prefer to switch to 80 different pain medication that would be more effective in managing his pain.  Patient is also complaining of some chronic constipation.  He states he is taking some stool softeners and occasional rare lacks only.  He also continues to complain of minimal appetite; most likely secondary to his chronic dysphagia.  Patient continues to lose weight.   HPI  CURRENT THERAPY: Upcoming Treatment Dates - COLORECTAL FOLFOX q14d Days with orders from any treatment category:  06/30/2014      [facility] COMMUNICATION      sodium chloride 0.9 % injection 10 mL      heparin lock flush 100 unit/mL      heparin lock flush 100 unit/mL      alteplase (CATHFLO ACTIVASE) injection 2 mg      sodium chloride 0.9 % injection 3 mL      Cold Pack 1 packet 07/12/2014      SCHEDULING COMMUNICATION      ondansetron (ZOFRAN) IVPB 8 mg      dexamethasone (DECADRON) injection 10 mg      oxaliplatin (ELOXATIN) 135 mg in dextrose 5 % 500 mL chemo infusion      leucovorin 644 mg in dextrose 5 % 250 mL infusion      fluorouracil (ADRUCIL) chemo injection 650 mg      fluorouracil (ADRUCIL) 3,850 mg in sodium chloride 0.9 % 150 mL chemo infusion      sodium chloride 0.9 % injection 10 mL      heparin lock flush 100 unit/mL      heparin lock flush 100 unit/mL      alteplase (CATHFLO ACTIVASE) injection 2 mg      sodium chloride 0.9 % injection 3 mL    Cold Pack 1 packet      Hot Pack 1 packet      dextrose 5 % solution      TREATMENT CONDITIONS 07/14/2014      SCHEDULING COMMUNICATION      sodium chloride 0.9 % injection 10 mL      heparin lock flush 100 unit/mL      heparin lock flush 100 unit/mL      alteplase (CATHFLO ACTIVASE) injection 2 mg      sodium chloride 0.9 % injection 3 mL      Cold Pack 1 packet    ROS  Past Medical History  Diagnosis Date  . Cancer 08/26/13    esophageal path pending  . ETOH abuse   . Tobacco abuse   . 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 History of aorta-iliac-femoral bypass   . History of radiation therapy 09/14/13-10/26/13    esophageal ca/50.4Gy/53f    Past Surgical History  Procedure Laterality Date  . Cardiac surgery      femoral bypass surgery  . Visceral angiogram N/A 11/25/2013    Procedure: VISCERAL ANGIOGRAM;  Surgeon: Serafina Mitchell, MD;  Location: [facility] CATH LAB;  Service: Cardiovascular;  Laterality: N/A;  . Esophagogastroduodenoscopy N/A 06/18/2014    Procedure: ESOPHAGOGASTRODUODENOSCOPY (EGD);  Surgeon: Beryle Beams, MD;  Location: [facility] ENDOSCOPY;  Service: Endoscopy;  Laterality: N/A;  . Balloon dilation N/A 06/18/2014    Procedure: BALLOON DILATION;  Surgeon: Beryle Beams, MD;  Location: [facility] ENDOSCOPY;  Service: Endoscopy;  Laterality: N/A;    has Malignant neoplasm of lower third of esophagus; S/P aorto-bifemoral bypass surgery; Chronic mesenteric arterial insufficiency syndrome; History of aorta-iliac-femoral bypass; ETOH abuse; Tobacco abuse; Thoracoabdominal aortic aneurysm, without rupture; Mesenteric artery insufficiency; AAA (abdominal aortic aneurysm) without rupture; Celiac artery stenosis; Hypercalcemia; Anorexia; Nausea with vomiting; Constipation; Weakness; Dehydration; Hypoalbuminemia; Financial difficulties; Neoplasm related pain; and Dysphagia on his problem list.    is allergic to nsaids.    Medication List       This  list is accurate as of: 06/28/14 10:44 PM.  Always use your most recent med list.               CARAFATE 1 GM/10ML suspension  Generic drug:  sucralfate  Take 10 mLs by mouth 4 (four) times daily as needed.     EX-LAX PO  Take 1 tablet by mouth daily as needed (Constipation).     HYDROcodone-acetaminophen 5-325 MG per tablet  Commonly known as:  NORCO  Take 1-2 tablets by mouth every 6 (six) hours as needed for moderate pain.     lidocaine-prilocaine cream  Commonly known as:  EMLA  Apply 1 application topically as needed. Apply approx 30 minutes prior to accessing port.     ondansetron 8 MG disintegrating tablet  Commonly known as:  ZOFRAN ODT  Take 1 tablet (8 mg total) by mouth every 8 (eight) hours as needed for nausea or vomiting.     oxyCODONE 5 MG immediate release tablet  Commonly known as:  Oxy IR/ROXICODONE  Take 1 tablet (5 mg total) by mouth every 6 (six) hours as needed for severe pain.     potassium chloride SA 20 MEQ tablet  Commonly known as:  K-DUR,KLOR-CON  Take 1 tablet (20 mEq total) by mouth daily.     prochlorperazine 10 MG tablet  Commonly known as:  COMPAZINE  Take 1 tablet (10 mg total) by mouth every 6 (six) hours as needed for nausea or vomiting.     promethazine 25 MG tablet  Commonly known as:  PHENERGAN  Take 1 tablet (25 mg total) by mouth every 6 (six) hours as needed for nausea or vomiting.     traMADol 50 MG tablet  Commonly known as:  ULTRAM  Take 1 tablet (50 mg total) by mouth every 6 (six) hours as needed.         PHYSICAL EXAMINATION  Blood pressure 132/86, pulse 96, temperature 98.5 F (36.9 C), temperature source Oral, resp. rate 18, height 5' 11"  (1.803 m), weight 106 lb (48.081 kg), SpO2 100 %.  Physical Exam  Constitutional: He is oriented to person, place, and time. Vital signs are normal. He appears malnourished. He appears unhealthy. He appears cachectic.  HENT:  Head: Normocephalic and atraumatic.  Mouth/Throat:  Oropharynx is clear and moist.  Eyes: Conjunctivae and EOM are normal. Pupils are equal, round, and reactive to light. Right eye exhibits no discharge. Left eye exhibits no discharge. No scleral icterus.  Neck: Normal range of motion. Neck supple. No JVD present. No tracheal deviation present. No thyromegaly present.  Cardiovascular:  Normal rate, regular rhythm, normal heart sounds and intact distal pulses.   Pulmonary/Chest: Effort normal and breath sounds normal. No respiratory distress. He has no wheezes. He has no rales. He exhibits no tenderness.  Abdominal: Soft. Bowel sounds are normal. He exhibits no distension and no mass. There is no tenderness. There is no rebound and no guarding.  Musculoskeletal: Normal range of motion. He exhibits no edema or tenderness.  Lymphadenopathy:    He has no cervical adenopathy.  Neurological: He is alert and oriented to person, place, and time. Gait normal.  Skin: Skin is warm and dry. No rash noted. No erythema. There is pallor.  Psychiatric: Affect normal.  Nursing note and vitals reviewed.   LABORATORY DATA:. Appointment on 06/28/2014  Component Date Value Ref Range Status  . WBC 06/28/2014 8.8  4.0 - 10.3 10e3/uL Final  . NEUT# 06/28/2014 6.9* 1.5 - 6.5 10e3/uL Final  . HGB 06/28/2014 13.6  13.0 - 17.1 g/dL Final  . HCT 06/28/2014 41.9  38.4 - 49.9 % Final  . Platelets 06/28/2014 211  140 - 400 10e3/uL Final  . MCV 06/28/2014 98.7* 79.3 - 98.0 fL Final  . MCH 06/28/2014 32.1  27.2 - 33.4 pg Final  . MCHC 06/28/2014 32.5  32.0 - 36.0 g/dL Final  . RBC 06/28/2014 4.25  4.20 - 5.82 10e6/uL Final  . RDW 06/28/2014 15.0* 11.0 - 14.6 % Final  . lymph# 06/28/2014 0.9  0.9 - 3.3 10e3/uL Final  . MONO# 06/28/2014 0.8  0.1 - 0.9 10e3/uL Final  . Eosinophils Absolute 06/28/2014 0.0  0.0 - 0.5 10e3/uL Final  . Basophils Absolute 06/28/2014 0.1  0.0 - 0.1 10e3/uL Final  . NEUT% 06/28/2014 79.4* 39.0 - 75.0 % Final  . LYMPH% 06/28/2014 9.9* 14.0 - 49.0  % Final  . MONO% 06/28/2014 9.6  0.0 - 14.0 % Final  . EOS% 06/28/2014 0.3  0.0 - 7.0 % Final  . BASO% 06/28/2014 0.8  0.0 - 2.0 % Final  . Sodium 06/28/2014 140  136 - 145 mEq/L Final  . Potassium 06/28/2014 4.4  3.5 - 5.1 mEq/L Final  . Chloride 06/28/2014 105  98 - 109 mEq/L Final  . CO2 06/28/2014 26  22 - 29 mEq/L Final  . Glucose 06/28/2014 128  70 - 140 mg/dl Final  . BUN 06/28/2014 8.0  7.0 - 26.0 mg/dL Final  . Creatinine 06/28/2014 0.8  0.7 - 1.3 mg/dL Final  . Total Bilirubin 06/28/2014 0.34  0.20 - 1.20 mg/dL Final  . Alkaline Phosphatase 06/28/2014 112  40 - 150 U/L Final  . AST 06/28/2014 14  5 - 34 U/L Final  . ALT 06/28/2014 <6  0 - 55 U/L Final  . Total Protein 06/28/2014 6.6  6.4 - 8.3 g/dL Final  . Albumin 06/28/2014 2.8* 3.5 - 5.0 g/dL Final  . Calcium 06/28/2014 10.0  8.4 - 10.4 mg/dL Final  . Anion Gap 06/28/2014 9  3 - 11 mEq/L Final  . EGFR 06/28/2014 >90  >90 ml/min/1.73 m2 Final   eGFR is calculated using the CKD-EPI Creatinine Equation (2009)     RADIOGRAPHIC STUDIES: No results found.  ASSESSMENT/PLAN:    Anorexia Continues with dysphagia; and subsequent minimal appetitive and weight loss.  Advised patient to eat multiple small meals throughout day if possible.    Constipation C/o worsening constipation; most likely secondary to use of pain medication.  Advised pt to continue with stool softeners twice daily; and Miralax every 6 hours until constipation  clears.     Dysphagia Pt reports that his dysphagia is stable at present. Will continue to monitor.    Hypoalbuminemia Albumin remains low.  Advised pt to push protein in his diet as much as possible.    Malignant neoplasm of lower third of esophagus Blood counts remain stable; and pt tolerating chemo fairly well. Will proceed today with cycle 3 of folfox chemo.  Pt will return for a follow up visit 07/05/14.  He is scheduled for cycle 4 of this same chemo regimen 07/12/14.    Neoplasm related  pain Patient continues to complain of some chronic pain to his lower chest and upper abdomen areas.  Patient states that her mental does not work for him whatsoever.  Patient has been taking hydrocodone 2 tablets every 4 hours with only minimal relief.  Will switch patient's pain medication to oxycodone 5 mg tablets.  Advised patient to take only one of the oxycodone every 4-6 hours as needed for pain.  Patient states that he is no longer able to work; and understands that he cannot operate heavy machinery or drive while taking pain medication.   Patient stated understanding of all instructions; and was in agreement with this plan of care. The patient knows to call the clinic with any problems, questions or concerns.   Review/collaboration with Dr. Benay Spice regarding all aspects of patient's visit today.   Total time spent with patient was 25 minutes;  with greater than 75 percent of that time spent in face to face counseling regarding his symptoms, and coordination of care and follow up.  Disclaimer: This note was dictated with voice recognition software. Similar sounding words can inadvertently be transcribed and may not be corrected upon review.   Drue Second, NP 06/28/2014

## 2014-06-28 NOTE — Patient Instructions (Signed)
[facility] Discharge Instructions for Patients Receiving Chemotherapy  Today you received the following chemotherapy agents: oxaliplatin  To help prevent nausea and vomiting after your treatment, we encourage you to take your nausea medication.  Take it as often as prescribed.     If you develop nausea and vomiting that is not controlled by your nausea medication, call the clinic. If it is after clinic hours your family physician or the after hours number for the clinic or go to the Emergency Department.   BELOW ARE SYMPTOMS THAT SHOULD BE REPORTED IMMEDIATELY:  *FEVER GREATER THAN 100.5 F  *CHILLS WITH OR WITHOUT FEVER  NAUSEA AND VOMITING THAT IS NOT CONTROLLED WITH YOUR NAUSEA MEDICATION  *UNUSUAL SHORTNESS OF BREATH  *UNUSUAL BRUISING OR BLEEDING  TENDERNESS IN MOUTH AND THROAT WITH OR WITHOUT PRESENCE OF ULCERS  *URINARY PROBLEMS  *BOWEL PROBLEMS  UNUSUAL RASH Items with * indicate a potential emergency and should be followed up as soon as possible.  Feel free to call the clinic you have any questions or concerns. The clinic phone number is [phone].   I have been informed and understand all the instructions given to me. I know to contact the clinic, my physician, or go to the Emergency Department if any problems should occur. I do not have any questions at this time, but understand that I may call the clinic during office hours   should I have any questions or need assistance in obtaining follow up care.    __________________________________________  _____________  __________ Signature of Patient or Authorized Representative            Date                   Time    __________________________________________ Nurse's Signature

## 2014-06-28 NOTE — Assessment & Plan Note (Signed)
C/o worsening constipation; most likely secondary to use of pain medication.  Advised pt to continue with stool softeners twice daily; and Miralax every 6 hours until constipation clears.

## 2014-06-28 NOTE — Assessment & Plan Note (Signed)
Continues with dysphagia; and subsequent minimal appetitive and weight loss.  Advised patient to eat multiple small meals throughout day if possible.

## 2014-06-28 NOTE — Assessment & Plan Note (Signed)
Blood counts remain stable; and pt tolerating chemo fairly well. Will proceed today with cycle 3 of folfox chemo.  Pt will return for a follow up visit 07/05/14.  He is scheduled for cycle 4 of this same chemo regimen 07/12/14.

## 2014-06-28 NOTE — Assessment & Plan Note (Signed)
Patient continues to complain of some chronic pain to his lower chest and upper abdomen areas.  Patient states that her mental does not work for him whatsoever.  Patient has been taking hydrocodone 2 tablets every 4 hours with only minimal relief.  Will switch patient's pain medication to oxycodone 5 mg tablets.  Advised patient to take only one of the oxycodone every 4-6 hours as needed for pain.  Patient states that he is no longer able to work; and understands that he cannot operate heavy machinery or drive while taking pain medication.

## 2014-06-28 NOTE — Progress Notes (Signed)
Nutrition follow-up completed with patient in chemo for treatment is distal esophageal CA.  Wt 107# (06/24/14) stable, but still meets criteria for severe malnutrition. Pt states he is still only eating 2 meals/day, but does eat some snacks. Pt not drinking Boost, believes they are making him constipated, but also now realizes he is not taking Miralax as directed and plans to start.  No nausea reported, no drinking water, mostly [facility].  Nutrition Dx:  Inadequate oral intake continues.  Intervention:   Recommend Miralax to be taken as directed by MD Recommend Boost plus 4x/day, also provided samples of juice type supplements, Ensure clear and Boost Breeze. Strongly encouraged small frequent high kcal/high protein meals and snacks with supplements to promote wt gain and minimize complication from malnutrition.  Monitoring evaluation and goals:  Pt will increase oral intake to minimize further wt loss and promote wt gain.   Next visit:  To be scheduled during chemo.

## 2014-06-30 ENCOUNTER — Ambulatory Visit (HOSPITAL_BASED_OUTPATIENT_CLINIC_OR_DEPARTMENT_OTHER): Payer: 59 | Admitting: Nurse Practitioner

## 2014-06-30 ENCOUNTER — Encounter: Payer: Self-pay | Admitting: Nurse Practitioner

## 2014-06-30 ENCOUNTER — Other Ambulatory Visit: Payer: Self-pay | Admitting: Nurse Practitioner

## 2014-06-30 ENCOUNTER — Ambulatory Visit (HOSPITAL_BASED_OUTPATIENT_CLINIC_OR_DEPARTMENT_OTHER): Payer: 59

## 2014-06-30 VITALS — BP 123/81 | HR 106

## 2014-06-30 DIAGNOSIS — R630 Anorexia: Secondary | ICD-10-CM

## 2014-06-30 DIAGNOSIS — R634 Abnormal weight loss: Secondary | ICD-10-CM | POA: Insufficient documentation

## 2014-06-30 DIAGNOSIS — C155 Malignant neoplasm of lower third of esophagus: Secondary | ICD-10-CM

## 2014-06-30 DIAGNOSIS — K5900 Constipation, unspecified: Secondary | ICD-10-CM

## 2014-06-30 DIAGNOSIS — R112 Nausea with vomiting, unspecified: Secondary | ICD-10-CM

## 2014-06-30 DIAGNOSIS — I951 Orthostatic hypotension: Secondary | ICD-10-CM

## 2014-06-30 DIAGNOSIS — E860 Dehydration: Secondary | ICD-10-CM

## 2014-06-30 DIAGNOSIS — R110 Nausea: Secondary | ICD-10-CM

## 2014-06-30 DIAGNOSIS — G893 Neoplasm related pain (acute) (chronic): Secondary | ICD-10-CM

## 2014-06-30 MED ORDER — HEPARIN SOD (PORK) LOCK FLUSH 100 UNIT/ML IV SOLN
500.0000 [IU] | Freq: Once | INTRAVENOUS | Status: AC
  Administered 2014-06-30: 500 [IU] via INTRAVENOUS
  Filled 2014-06-30: qty 5

## 2014-06-30 MED ORDER — HEPARIN SOD (PORK) LOCK FLUSH 100 UNIT/ML IV SOLN
500.0000 [IU] | Freq: Once | INTRAVENOUS | Status: AC | PRN
  Administered 2014-06-30: 500 [IU]
  Filled 2014-06-30: qty 5

## 2014-06-30 MED ORDER — SODIUM CHLORIDE 0.9 % IV SOLN
Freq: Once | INTRAVENOUS | Status: AC
  Administered 2014-06-30: 14:00:00 via INTRAVENOUS

## 2014-06-30 MED ORDER — ONDANSETRON 8 MG/50ML IVPB (CHCC)
8.0000 mg | Freq: Once | INTRAVENOUS | Status: AC
  Administered 2014-06-30: 8 mg via INTRAVENOUS

## 2014-06-30 MED ORDER — SODIUM CHLORIDE 0.9 % IJ SOLN
10.0000 mL | INTRAMUSCULAR | Status: DC | PRN
  Administered 2014-06-30: 10 mL
  Filled 2014-06-30: qty 10

## 2014-06-30 MED ORDER — ONDANSETRON 8 MG/NS 50 ML IVPB
INTRAVENOUS | Status: AC
  Filled 2014-06-30: qty 8

## 2014-06-30 MED ORDER — SODIUM CHLORIDE 0.9 % IJ SOLN
10.0000 mL | Freq: Once | INTRAMUSCULAR | Status: AC
  Administered 2014-06-30: 10 mL via INTRAVENOUS
  Filled 2014-06-30: qty 10

## 2014-06-30 NOTE — Progress Notes (Signed)
Received call from [place] who reports patient having low BP, exhibiting dizziness and sweating at the time of pump d/c (D3C3). Patient reports feeling faint during orthostatic vitals; reports this is the first time he has felt like this.  HR in 110s-120 radially, thready. Patient to be seen with [facility] for eval and fluids per Selena Lesser. Patient wheeled to exam room. No labs ordered at this time.

## 2014-06-30 NOTE — Progress Notes (Signed)
VSS, patient denies dizziness with position changes. Voices that he can drive home and feels much better. Encouraged pt to call us if he needs more IVFs tomorrow or if any concerns arise. Voices understanding. appts and AVS given along with CAC. Ambulated with patient outside to [place]. Gait steady.

## 2014-06-30 NOTE — Assessment & Plan Note (Signed)
Patient has been switched from hydrocodone to oxycodone for continued, chronic pain to the esophageal area.  However, patient states that the oxycodone 5 mg is still somewhat ineffective in managing his pain.  Advised patient to try taking only one of the oxycodone every 4-6 hours if at all possible.

## 2014-06-30 NOTE — Progress Notes (Signed)
Patient in for Infusion pump disconnect. Patient states, "I feel dizzy and light headed." Blood pressure checked, 97/74 and pulse is 88. Patient reports, "I've been feeling this way since they put that pump on me." Ann Lions, RN, BSN in to assess Patient. Port flushed with 10 mL of Normal Saline and 5 mL of Heparin without any difficulty and Infusion pump disconnected. Attempted to obtain an orthostatic blood pressure, but Patient stated, "I have to sit down. I feel weak." Assisted Patient to reclining chair. Blood pressure rechecked, 89/63 and pulse is 133. Patient stated, "I feel like I'm going to pass out." Cool cloths applied to Patient's wrist and neck at that time. After 5 minutes Patient stated, "I'm okay now." Patient encouraged by Ann Lions, RN, BSN to be seen by Selena Lesser, NP Symptom Management today, and Patient agreed. Patient's port left accessed for appointment with Selena Lesser, NP. Patient in wheelchair and assisted from [place] by Ann Lions, RN, BSN.

## 2014-06-30 NOTE — Assessment & Plan Note (Signed)
Patient is complaining of some chronic nausea; but no vomiting since this last cycle of chemotherapy.  Patient was given Zofran IV while at the cancer center.

## 2014-06-30 NOTE — Assessment & Plan Note (Signed)
Patient initiated cycle 3 of his FOLFOX chemotherapy this past Monday, 06/28/2014.  He returned to the cancer today for discontinuation of his chemotherapy pump.  Patient states that he has had no appetite since this past Monday; and has had little oral intake.  He does feel dehydrated today.  Patient received approximately 1250 ML's of normal saline IV fluid rehydration today.  Patient was able to drink some chicken broth while at the [facility] today.  Advised patient to push fluids at home.  Advised patient that he may very well need additional IV fluid rehydration this week.  Advised patient that we would call him first thing tomorrow morning and see if he needs any additional IV fluids.

## 2014-06-30 NOTE — Patient Instructions (Signed)

## 2014-06-30 NOTE — Progress Notes (Signed)
SYMPTOM MANAGEMENT CLINIC   HPI: Brandon [name] 59 y.o. male diagnosed with esophageal cancer.  Currently undergoing FOLFOX chemotherapy therapy regimen.  Patient initiated cycle 3 of his FOLFOX chemotherapy this past Monday, 06/28/2014.  He presented to the [facility] today for discontinuation of his chemotherapy pump.  He is complaining of dizziness with position changes and increased overall weakness.  Orthostatic vital signs revealed a standing blood pressure 87/63 with a heart rate of 120.  Patient states that he has been experiencing nausea since his last cycle of chemotherapy; and has had minimal to no oral intake whatsoever.  He is complaining of no appetite as well.  Patient denies any recent fevers or chills.  HPI  CURRENT THERAPY: Upcoming Treatment Dates - COLORECTAL FOLFOX q14d Days with orders from any treatment category:  07/12/2014      SCHEDULING COMMUNICATION      ondansetron (ZOFRAN) IVPB 8 mg      dexamethasone (DECADRON) injection 10 mg      oxaliplatin (ELOXATIN) 135 mg in dextrose 5 % 500 mL chemo infusion      leucovorin 644 mg in dextrose 5 % 250 mL infusion      fluorouracil (ADRUCIL) chemo injection 650 mg      fluorouracil (ADRUCIL) 3,850 mg in sodium chloride 0.9 % 150 mL chemo infusion      sodium chloride 0.9 % injection 10 mL      heparin lock flush 100 unit/mL      heparin lock flush 100 unit/mL      alteplase (CATHFLO ACTIVASE) injection 2 mg      sodium chloride 0.9 % injection 3 mL      Cold Pack 1 packet      Hot Pack 1 packet      dextrose 5 % solution      TREATMENT CONDITIONS 07/14/2014      SCHEDULING COMMUNICATION      sodium chloride 0.9 % injection 10 mL      heparin lock flush 100 unit/mL      heparin lock flush 100 unit/mL      alteplase (CATHFLO ACTIVASE) injection 2 mg      sodium chloride 0.9 % injection 3 mL      Cold Pack 1 packet    ROS  Past Medical History  Diagnosis Date  . Cancer 08/26/13    esophageal path  pending  . ETOH abuse   . Tobacco abuse   . 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 History of aorta-iliac-femoral bypass   . History of radiation therapy 09/14/13-10/26/13    esophageal ca/50.4Gy/55f    Past Surgical History  Procedure Laterality Date  . Cardiac surgery      femoral bypass surgery  . Visceral angiogram N/A 11/25/2013    Procedure: VISCERAL ANGIOGRAM;  Surgeon: VSerafina Mitchell MD;  [facility];  Service: Cardiovascular;  Laterality: N/A;  . Esophagogastroduodenoscopy N/A 06/18/2014    Procedure: ESOPHAGOGASTRODUODENOSCOPY (EGD);  Surgeon: PBeryle Beams MD;  [facility];  Service: Endoscopy;  Laterality: N/A;  . Balloon dilation N/A 06/18/2014    Procedure: BALLOON DILATION;  Surgeon: PBeryle Beams MD;  Location: [facility];  Service: Endoscopy;  Laterality: N/A;    has Malignant neoplasm of lower third of esophagus; S/P aorto-bifemoral bypass surgery; Chronic mesenteric arterial insufficiency syndrome; History of aorta-iliac-femoral bypass; ETOH abuse; Tobacco abuse; Thoracoabdominal aortic aneurysm, without rupture; Mesenteric artery insufficiency; AAA (abdominal  aortic aneurysm) without rupture; Celiac artery stenosis; Hypercalcemia; Anorexia; Nausea with vomiting; Constipation; Weakness; Dehydration; Hypoalbuminemia; Financial difficulties; Neoplasm related pain; Dysphagia; Weight loss; and Orthostatic hypotension on his problem list.    is allergic to nsaids.    Medication List       This list is accurate as of: 06/30/14  4:58 PM.  Always use your most recent med list.               CARAFATE 1 GM/10ML suspension  Generic drug:  sucralfate  Take 10 mLs by mouth 4 (four) times daily as needed.     EX-LAX PO  Take 1 tablet by mouth daily as needed (Constipation).     HYDROcodone-acetaminophen 5-325 MG per tablet  Commonly known as:  NORCO  Take 1-2 tablets by mouth every 6 (six) hours as needed for  moderate pain.     lidocaine-prilocaine cream  Commonly known as:  EMLA  Apply 1 application topically as needed. Apply approx 30 minutes prior to accessing port.     ondansetron 8 MG disintegrating tablet  Commonly known as:  ZOFRAN ODT  Take 1 tablet (8 mg total) by mouth every 8 (eight) hours as needed for nausea or vomiting.     oxyCODONE 5 MG immediate release tablet  Commonly known as:  Oxy IR/ROXICODONE  Take 1 tablet (5 mg total) by mouth every 6 (six) hours as needed for severe pain.     potassium chloride SA 20 MEQ tablet  Commonly known as:  K-DUR,KLOR-CON  Take 1 tablet (20 mEq total) by mouth daily.     prochlorperazine 10 MG tablet  Commonly known as:  COMPAZINE  Take 1 tablet (10 mg total) by mouth every 6 (six) hours as needed for nausea or vomiting.     promethazine 25 MG tablet  Commonly known as:  PHENERGAN  Take 1 tablet (25 mg total) by mouth every 6 (six) hours as needed for nausea or vomiting.     traMADol 50 MG tablet  Commonly known as:  ULTRAM  Take 1 tablet (50 mg total) by mouth every 6 (six) hours as needed.         PHYSICAL EXAMINATION  Blood pressure 123/81, pulse 106.   Initial vitals: Sitting 97/74, HR 88.  Standing 87/63, HR 120.  After IV fluids- BP 123/81, HR 106.    Physical Exam  Constitutional: He is oriented to person, place, and time. He appears malnourished and dehydrated. He appears unhealthy. He appears cachectic.  HENT:  Head: Normocephalic and atraumatic.  Mouth/Throat: Oropharynx is clear and moist.  Eyes: Conjunctivae and EOM are normal. Pupils are equal, round, and reactive to light. Right eye exhibits no discharge. Left eye exhibits no discharge. No scleral icterus.  Neck: Normal range of motion. Neck supple. No JVD present. No tracheal deviation present. No thyromegaly present.  Cardiovascular: Normal rate, regular rhythm, normal heart sounds and intact distal pulses.   Pulmonary/Chest: Effort normal and breath sounds  normal. No respiratory distress. He has no wheezes. He has no rales. He exhibits no tenderness.  Abdominal: Soft. Bowel sounds are normal. He exhibits no distension and no mass. There is no tenderness. There is no rebound and no guarding.  Musculoskeletal: Normal range of motion. He exhibits no edema or tenderness.  Lymphadenopathy:    He has no cervical adenopathy.  Neurological: He is alert and oriented to person, place, and time. Gait normal.  Skin: Skin is warm and dry. No rash noted. No  erythema. There is pallor.  Psychiatric: Affect normal.  Nursing note and vitals reviewed.   LABORATORY DATA:. Appointment on 06/28/2014  Component Date Value Ref Range Status  . WBC 06/28/2014 8.8  4.0 - 10.3 10e3/uL Final  . NEUT# 06/28/2014 6.9* 1.5 - 6.5 10e3/uL Final  . HGB 06/28/2014 13.6  13.0 - 17.1 g/dL Final  . HCT 06/28/2014 41.9  38.4 - 49.9 % Final  . Platelets 06/28/2014 211  140 - 400 10e3/uL Final  . MCV 06/28/2014 98.7* 79.3 - 98.0 fL Final  . MCH 06/28/2014 32.1  27.2 - 33.4 pg Final  . MCHC 06/28/2014 32.5  32.0 - 36.0 g/dL Final  . RBC 06/28/2014 4.25  4.20 - 5.82 10e6/uL Final  . RDW 06/28/2014 15.0* 11.0 - 14.6 % Final  . lymph# 06/28/2014 0.9  0.9 - 3.3 10e3/uL Final  . MONO# 06/28/2014 0.8  0.1 - 0.9 10e3/uL Final  . Eosinophils Absolute 06/28/2014 0.0  0.0 - 0.5 10e3/uL Final  . Basophils Absolute 06/28/2014 0.1  0.0 - 0.1 10e3/uL Final  . NEUT% 06/28/2014 79.4* 39.0 - 75.0 % Final  . LYMPH% 06/28/2014 9.9* 14.0 - 49.0 % Final  . MONO% 06/28/2014 9.6  0.0 - 14.0 % Final  . EOS% 06/28/2014 0.3  0.0 - 7.0 % Final  . BASO% 06/28/2014 0.8  0.0 - 2.0 % Final  . Sodium 06/28/2014 140  136 - 145 mEq/L Final  . Potassium 06/28/2014 4.4  3.5 - 5.1 mEq/L Final  . Chloride 06/28/2014 105  98 - 109 mEq/L Final  . CO2 06/28/2014 26  22 - 29 mEq/L Final  . Glucose 06/28/2014 128  70 - 140 mg/dl Final  . BUN 06/28/2014 8.0  7.0 - 26.0 mg/dL Final  . Creatinine 06/28/2014 0.8  0.7  - 1.3 mg/dL Final  . Total Bilirubin 06/28/2014 0.34  0.20 - 1.20 mg/dL Final  . Alkaline Phosphatase 06/28/2014 112  40 - 150 U/L Final  . AST 06/28/2014 14  5 - 34 U/L Final  . ALT 06/28/2014 <6  0 - 55 U/L Final  . Total Protein 06/28/2014 6.6  6.4 - 8.3 g/dL Final  . Albumin 06/28/2014 2.8* 3.5 - 5.0 g/dL Final  . Calcium 06/28/2014 10.0  8.4 - 10.4 mg/dL Final  . Anion Gap 06/28/2014 9  3 - 11 mEq/L Final  . EGFR 06/28/2014 >90  >90 ml/min/1.73 m2 Final   eGFR is calculated using the CKD-EPI Creatinine Equation (2009)     RADIOGRAPHIC STUDIES: No results found.  ASSESSMENT/PLAN:    Anorexia Continues with dysphagia; and subsequent minimal appetitive and weight loss.  Advised patient to eat multiple small meals throughout day if possible.      Constipation C/o worsening constipation; most likely secondary to use of pain medication.  Advised pt to continue with stool softeners twice daily; and Miralax every 6 hours until constipation clears.       Dehydration Patient initiated cycle 3 of his FOLFOX chemotherapy this past Monday, 06/28/2014.  He returned to the cancer today for discontinuation of his chemotherapy pump.  Patient states that he has had no appetite since this past Monday; and has had little oral intake.  He does feel dehydrated today.  Patient received approximately 1250 ML's of normal saline IV fluid rehydration today.  Patient was able to drink some chicken broth while at the [facility] today.  Advised patient to push fluids at home.  Advised patient that he may very well need additional IV  fluid rehydration this week.  Advised patient that we would call him first thing tomorrow morning and see if he needs any additional IV fluids.     Malignant neoplasm of lower third of esophagus Patient initiated cycle 3 of his FOLFOX chemotherapy this past Monday, 06/28/2014.  He has plans to return for follow-up visit on 07/05/2014.  He will return for labs and cycle 4  of the same chemotherapy regimen on 07/12/2014.     Nausea with vomiting Patient is complaining of some chronic nausea; but no vomiting since this last cycle of chemotherapy.  Patient was given Zofran IV while at the cancer center.   Neoplasm related pain Patient has been switched from hydrocodone to oxycodone for continued, chronic pain to the esophageal area.  However, patient states that the oxycodone 5 mg is still somewhat ineffective in managing his pain.  Advised patient to try taking only one of the oxycodone every 4-6 hours if at all possible.   Orthostatic hypotension Patient presented to the [facility] today for discontinuation of his chemotherapy pump.  He was complaining of some dizziness with position changes and some increased weakness.  Orthostatic vital signs were obtained; with a sitting blood pressure 97/74 and a heart rate of 88.  The patient stood his blood pressure dropped to 87/63 with a heart rate of 120.  Confirmed patient does not take any blood pressure medications whatsoever.  Patient also states that he last took his pain medication several hours ago.   Patient was given approximately 1250 ML's normal saline IV fluid rehydration today; with an improvement in patient's blood pressure.  Most likely, patient's orthostatic hypotension secondary to dehydration.   Patient stated understanding of all instructions; and was in agreement with this plan of care. The patient knows to call the clinic with any problems, questions or concerns.   Review/collaboration with Dr.  Benay Spice regarding all aspects of patient's visit today.   Total time spent with patient was  25 minutes;  with greater than  75 percent of that time spent in face to face counseling regarding  his symptoms, and coordination of care and follow up.  Disclaimer: This note was dictated with voice recognition software. Similar sounding words can inadvertently be transcribed and may not be corrected upon  review.   Drue Second, NP 06/30/2014

## 2014-06-30 NOTE — Assessment & Plan Note (Signed)
C/o worsening constipation; most likely secondary to use of pain medication.  Advised pt to continue with stool softeners twice daily; and Miralax every 6 hours until constipation clears.

## 2014-06-30 NOTE — Assessment & Plan Note (Signed)
Continues with dysphagia; and subsequent minimal appetitive and weight loss.  Advised patient to eat multiple small meals throughout day if possible.

## 2014-06-30 NOTE — Assessment & Plan Note (Signed)
Patient initiated cycle 3 of his FOLFOX chemotherapy this past Monday, 06/28/2014.  He has plans to return for follow-up visit on 07/05/2014.  He will return for labs and cycle 4 of the same chemotherapy regimen on 07/12/2014.

## 2014-06-30 NOTE — Assessment & Plan Note (Signed)
Patient presented to the [facility] today for discontinuation of his chemotherapy pump.  He was complaining of some dizziness with position changes and some increased weakness.  Orthostatic vital signs were obtained; with a sitting blood pressure 97/74 and a heart rate of 88.  The patient stood his blood pressure dropped to 87/63 with a heart rate of 120.  Confirmed patient does not take any blood pressure medications whatsoever.  Patient also states that he last took his pain medication several hours ago.   Patient was given approximately 1250 ML's normal saline IV fluid rehydration today; with an improvement in patient's blood pressure.  Most likely, patient's orthostatic hypotension secondary to dehydration.

## 2014-07-01 ENCOUNTER — Telehealth: Payer: Self-pay | Admitting: *Deleted

## 2014-07-01 ENCOUNTER — Ambulatory Visit (HOSPITAL_BASED_OUTPATIENT_CLINIC_OR_DEPARTMENT_OTHER): Payer: 59

## 2014-07-01 ENCOUNTER — Other Ambulatory Visit: Payer: Self-pay | Admitting: Nurse Practitioner

## 2014-07-01 VITALS — BP 114/100 | HR 85 | Temp 97.4°F

## 2014-07-01 DIAGNOSIS — R110 Nausea: Secondary | ICD-10-CM

## 2014-07-01 DIAGNOSIS — R42 Dizziness and giddiness: Secondary | ICD-10-CM

## 2014-07-01 DIAGNOSIS — E860 Dehydration: Secondary | ICD-10-CM

## 2014-07-01 DIAGNOSIS — C155 Malignant neoplasm of lower third of esophagus: Secondary | ICD-10-CM

## 2014-07-01 MED ORDER — SODIUM CHLORIDE 0.9 % IV SOLN
1000.0000 mL | Freq: Once | INTRAVENOUS | Status: AC
  Administered 2014-07-01: 1000 mL via INTRAVENOUS

## 2014-07-01 MED ORDER — HEPARIN SOD (PORK) LOCK FLUSH 100 UNIT/ML IV SOLN
500.0000 [IU] | Freq: Once | INTRAVENOUS | Status: AC
  Administered 2014-07-01: 500 [IU] via INTRAVENOUS
  Filled 2014-07-01: qty 5

## 2014-07-01 MED ORDER — ONDANSETRON 8 MG/50ML IVPB (CHCC)
8.0000 mg | Freq: Once | INTRAVENOUS | Status: AC
  Administered 2014-07-01: 8 mg via INTRAVENOUS

## 2014-07-01 MED ORDER — SODIUM CHLORIDE 0.9 % IJ SOLN
10.0000 mL | Freq: Once | INTRAMUSCULAR | Status: AC
  Administered 2014-07-01: 10 mL via INTRAVENOUS
  Filled 2014-07-01: qty 10

## 2014-07-01 MED ORDER — ONDANSETRON 8 MG/NS 50 ML IVPB
INTRAVENOUS | Status: AC
  Filled 2014-07-01: qty 8

## 2014-07-01 NOTE — Progress Notes (Signed)
Pt. Returns today due to ongoing dehydration issues, lightheaded with standing, dizzy and nauseated.  To receive IV zofran and IVF's.  Selena Lesser ,NP aware of pt arrival.  Orthostatic VS obtained and recorded.

## 2014-07-01 NOTE — Telephone Encounter (Signed)
Spoke with patient.  He is willing to come in today for IVF.  He will come on to cancer center now.

## 2014-07-01 NOTE — Progress Notes (Signed)
1500  IVF's complete. VS taken sitting and standing. Pt. States he feels much better. Denies lightheadedness or dizzyness. More more "perky". OK to discharge back home per Selena Lesser ,NP  Pt. Able to ambulate out of infusion area. Reinforced need for forcing fluids and eating.  Pt. Verbalized understanding.

## 2014-07-01 NOTE — Patient Instructions (Signed)
Dehydration, Adult Dehydration is when you lose more fluids from the body than you take in. Vital organs like the kidneys, brain, and heart cannot function without a proper amount of fluids and salt. Any loss of fluids from the body can cause dehydration.  CAUSES   Vomiting.  Diarrhea.  Excessive sweating.  Excessive urine output.  Fever. SYMPTOMS  Mild dehydration  Thirst.  Dry lips.  Slightly dry mouth. Moderate dehydration  Very dry mouth.  Sunken eyes.  Skin does not bounce back quickly when lightly pinched and released.  Dark urine and decreased urine production.  Decreased tear production.  Headache. Severe dehydration  Very dry mouth.  Extreme thirst.  Rapid, weak pulse (more than 100 beats per minute at rest).  Cold hands and feet.  Not able to sweat in spite of heat and temperature.  Rapid breathing.  Blue lips.  Confusion and lethargy.  Difficulty being awakened.  Minimal urine production.  No tears. DIAGNOSIS  Your caregiver will diagnose dehydration based on your symptoms and your exam. Blood and urine tests will help confirm the diagnosis. The diagnostic evaluation should also identify the cause of dehydration. TREATMENT  Treatment of mild or moderate dehydration can often be done at home by increasing the amount of fluids that you drink. It is best to drink small amounts of fluid more often. Drinking too much at one time can make vomiting worse. Refer to the home care instructions below. Severe dehydration needs to be treated at the hospital where you will probably be given intravenous (IV) fluids that contain water and electrolytes. HOME CARE INSTRUCTIONS   Ask your caregiver about specific rehydration instructions.  Drink enough fluids to keep your urine clear or pale yellow.  Drink small amounts frequently if you have nausea and vomiting.  Eat as you normally do.  Avoid:  Foods or drinks high in sugar.  Carbonated  drinks.  Juice.  Extremely hot or cold fluids.  Drinks with caffeine.  Fatty, greasy foods.  Alcohol.  Tobacco.  Overeating.  Gelatin desserts.  Wash your hands well to avoid spreading bacteria and viruses.  Only take over-the-counter or prescription medicines for pain, discomfort, or fever as directed by your caregiver.  Ask your caregiver if you should continue all prescribed and over-the-counter medicines.  Keep all follow-up appointments with your caregiver. SEEK MEDICAL CARE IF:  You have abdominal pain and it increases or stays in one area (localizes).  You have a rash, stiff neck, or severe headache.  You are irritable, sleepy, or difficult to awaken.  You are weak, dizzy, or extremely thirsty. SEEK IMMEDIATE MEDICAL CARE IF:   You are unable to keep fluids down or you get worse despite treatment.  You have frequent episodes of vomiting or diarrhea.  You have blood or green matter (bile) in your vomit.  You have blood in your stool or your stool looks black and tarry.  You have not urinated in 6 to 8 hours, or you have only urinated a small amount of very dark urine.  You have a fever.  You faint. MAKE SURE YOU:   Understand these instructions.  Will watch your condition.  Will get help right away if you are not doing well or get worse. Document Released: 05/28/2005 Document Revised: 08/20/2011 Document Reviewed: 01/15/2011 Exit[employer] Patient Information 2015 [employer]. This information is not intended to replace advice given to you by your health care provider. Make sure you discuss any questions you have with your health care   provider.  

## 2014-07-05 ENCOUNTER — Ambulatory Visit: Payer: 59 | Admitting: Nurse Practitioner

## 2014-07-05 ENCOUNTER — Telehealth: Payer: Self-pay | Admitting: Nurse Practitioner

## 2014-07-05 ENCOUNTER — Other Ambulatory Visit: Payer: Self-pay

## 2014-07-05 ENCOUNTER — Telehealth: Payer: Self-pay | Admitting: *Deleted

## 2014-07-05 NOTE — Telephone Encounter (Signed)
S/w pt confirming ML visit per 01/25 POF.... Cherylann Banas

## 2014-07-05 NOTE — Telephone Encounter (Signed)
Called pt with appt to be seen in office with possible IV fluids. Pt declined. He reports he has been able to drink more fluids and feels a little better. Instructed him to change positions slowly. He voiced understanding. Encouraged him to come in to office, he stated he would call if unable to drink fluids.

## 2014-07-06 ENCOUNTER — Ambulatory Visit (HOSPITAL_BASED_OUTPATIENT_CLINIC_OR_DEPARTMENT_OTHER): Payer: 59 | Admitting: Nurse Practitioner

## 2014-07-06 ENCOUNTER — Telehealth: Payer: Self-pay | Admitting: Oncology

## 2014-07-06 ENCOUNTER — Telehealth: Payer: Self-pay | Admitting: *Deleted

## 2014-07-06 ENCOUNTER — Ambulatory Visit (HOSPITAL_COMMUNITY)
Admission: RE | Admit: 2014-07-06 | Discharge: 2014-07-06 | Disposition: A | Discharge status: Home / Self Care | Payer: 59 | Source: Ambulatory Visit | Attending: Nurse Practitioner | Admitting: Nurse Practitioner

## 2014-07-06 ENCOUNTER — Ambulatory Visit (HOSPITAL_BASED_OUTPATIENT_CLINIC_OR_DEPARTMENT_OTHER): Payer: 59

## 2014-07-06 ENCOUNTER — Encounter (HOSPITAL_COMMUNITY): Payer: Self-pay

## 2014-07-06 VITALS — BP 108/79 | HR 90 | Temp 97.9°F | Resp 18 | Ht 71.0 in | Wt 106.4 lb

## 2014-07-06 DIAGNOSIS — R634 Abnormal weight loss: Secondary | ICD-10-CM

## 2014-07-06 DIAGNOSIS — C155 Malignant neoplasm of lower third of esophagus: Secondary | ICD-10-CM

## 2014-07-06 DIAGNOSIS — G893 Neoplasm related pain (acute) (chronic): Secondary | ICD-10-CM

## 2014-07-06 DIAGNOSIS — R42 Dizziness and giddiness: Secondary | ICD-10-CM | POA: Insufficient documentation

## 2014-07-06 DIAGNOSIS — R110 Nausea: Secondary | ICD-10-CM | POA: Insufficient documentation

## 2014-07-06 DIAGNOSIS — I739 Peripheral vascular disease, unspecified: Secondary | ICD-10-CM

## 2014-07-06 DIAGNOSIS — R1310 Dysphagia, unspecified: Secondary | ICD-10-CM

## 2014-07-06 DIAGNOSIS — D225 Melanocytic nevi of trunk: Secondary | ICD-10-CM

## 2014-07-06 DIAGNOSIS — R1010 Upper abdominal pain, unspecified: Secondary | ICD-10-CM

## 2014-07-06 LAB — BASIC METABOLIC PANEL (CC13)
Anion Gap: 9 mEq/L (ref 3–11)
BUN: 7.4 mg/dL (ref 7.0–26.0)
CO2: 23 mEq/L (ref 22–29)
Calcium: 8.5 mg/dL (ref 8.4–10.4)
Chloride: 107 mEq/L (ref 98–109)
Creatinine: 0.7 mg/dL (ref 0.7–1.3)
Glucose: 96 mg/dl (ref 70–140)
Potassium: 3.6 mEq/L (ref 3.5–5.1)
Sodium: 138 mEq/L (ref 136–145)

## 2014-07-06 MED ORDER — IOHEXOL 300 MG/ML  SOLN
100.0000 mL | Freq: Once | INTRAMUSCULAR | Status: AC | PRN
  Administered 2014-07-06: 100 mL via INTRAVENOUS

## 2014-07-06 MED ORDER — OXYCODONE HCL 5 MG PO TABS: 5.0000 mg | ORAL_TABLET | Freq: Four times a day (QID) | ORAL | Status: DC | PRN

## 2014-07-06 MED ORDER — PROMETHAZINE HCL 25 MG PO TABS: 12.5000 mg | ORAL_TABLET | Freq: Four times a day (QID) | ORAL | Status: AC | PRN

## 2014-07-06 NOTE — Progress Notes (Signed)
  [facility] OFFICE PROGRESS NOTE   Diagnosis:  Esophagus cancer  INTERVAL HISTORY:   Mr. Severs returns for scheduled follow-up. He completed cycle 3 FOLFOX on 06/28/2014. He required intravenous hydration on 06/30/2014 for dehydration. He continues to have intermittent dizziness mostly with position changes. He has persistent nausea. He notes partial relief of the nausea with Phenergan. No vomiting. He denies diplopia. No headaches. No falls. He is pushing fluids. He notes that his swallowing is better. No diarrhea. He continues to have upper abdominal pain. He takes oxycodone as needed.  Objective:  Vital signs in last 24 hours:  Blood pressure 97/69, pulse 102, temperature 97.9 F (36.6 C), temperature source Oral, resp. rate 18, height 5\' 11"  (1.803 m), weight 106 lb 6.4 oz (48.263 kg), SpO2 100 %.    HEENT: PERRLA. Extraocular movements intact. No thrush or ulcers. Mucous membranes appear moist. Resp: Lungs clear. Cardio: Regular rate and rhythm. GI: Abdomen soft and nontender. No hepatomegaly. Vascular: No leg edema. Neuro: Alert and oriented. Follows commands. Motor strength 5 over 5. Gait normal. Port-A-Cath without erythema.  Lab Results:  Lab Results  Component Value Date   WBC 8.8 06/28/2014   HGB 13.6 06/28/2014   HCT 41.9 06/28/2014   MCV 98.7* 06/28/2014   PLT 211 06/28/2014   NEUTROABS 6.9* 06/28/2014    Imaging:  No results found.  Medications: I have reviewed the patient's current medications.  Assessment/Plan: 1. 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  EGD by Dr. Collene Mares on 08/26/2013 with findings of a malignant appearing friable mass causing stenosis at 33 cm from the incisors extending to the GE junction. Stomach and the proximal small bowel were not visualized.   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   Staging PET scan on 09/03/2013 showed a 6.5 cm  segment of intense hypermetabolic activity associated with the mid to distal esophagus with SUV max 24.5. There was no evidence of mediastinal metastasis, gastrohepatic ligament nodal metastasis or liver metastasis.   Initiation of radiation 09/14/2013 and concurrent weekly Taxol/carboplatin on 09/15/2013. Cycle 5 Taxol/carboplatin 10/12/2013. Radiation completed 10/26/2013.   Restaging CTs 11/10/2013 with no evidence of metastatic disease and improvement in the esophagus wall thickening.  CT abdomen/pelvis 05/12/2014 with new extensive lymphadenopathy in the upper abdomen.  Cycle 1 FOLFOX 06/01/2014  Cycle 2 FOLFOX 06/14/2014  Cycle 3 FOLFOX 06/28/2014 2. Dysphagia secondary to #1. Initially improved, recurrent dysphagia October 2015 3. Weight loss secondary to #1. 4. Peripheral vascular disease. 5. History of mesenteric ischemia. 6. 4-5 mm hyperpigmented mole at the left upper back 7. Hypercalcemia status post Zometa 05/13/2014.   Disposition: Mr. Haag has completed 3 cycles of FOLFOX. He notes improvement in his swallowing.   He is experiencing persistent nausea and dizziness. He is no longer orthostatic. We are referring him for a brain CT.  We scheduled a return visit and cycle 4 FOLFOX on 07/13/2014. We will see him sooner pending the CT result.  Plan reviewed with Dr. Benay Spice. 25 minutes were spent face-to-face at today's visit with the majority of that time involved in counseling/coordination of care.  Ned Card ANP/GNP-BC   07/06/2014  10:09 AM

## 2014-07-06 NOTE — Telephone Encounter (Signed)
Per staff message and POF I have scheduled appts. Advised scheduler of appts and first available given. JMW  

## 2014-07-06 NOTE — Telephone Encounter (Signed)
s.w. pt and advised on 2.3 appt....ok and aware

## 2014-07-06 NOTE — Telephone Encounter (Signed)
gv and printed appt sched and avs fo rpt for Feb....emailed Lisa T. about books closed on 2.2...did not change pt appts....radiology said for pt to come on over sent pt to lab

## 2014-07-08 ENCOUNTER — Telehealth: Payer: Self-pay

## 2014-07-08 DIAGNOSIS — Z0279 Encounter for issue of other medical certificate: Secondary | ICD-10-CM

## 2014-07-08 NOTE — Telephone Encounter (Signed)
Called and informed pt of negative CT scan results. Pt verbalized understanding and denies any questions or concerns at this time.

## 2014-07-08 NOTE — Telephone Encounter (Signed)
-----   Message from Ladell Pier, MD sent at 07/07/2014  6:42 PM EST ----- Please call patient, brain CT is negative

## 2014-07-11 ENCOUNTER — Other Ambulatory Visit: Payer: Self-pay | Admitting: Oncology

## 2014-07-12 ENCOUNTER — Inpatient Hospital Stay: Payer: 59

## 2014-07-12 ENCOUNTER — Encounter: Payer: Self-pay | Admitting: Nutrition

## 2014-07-12 ENCOUNTER — Other Ambulatory Visit: Payer: 59

## 2014-07-14 ENCOUNTER — Telehealth: Payer: Self-pay | Admitting: Nurse Practitioner

## 2014-07-14 ENCOUNTER — Telehealth: Payer: Self-pay | Admitting: *Deleted

## 2014-07-14 ENCOUNTER — Other Ambulatory Visit (HOSPITAL_BASED_OUTPATIENT_CLINIC_OR_DEPARTMENT_OTHER): Payer: 59

## 2014-07-14 ENCOUNTER — Ambulatory Visit (HOSPITAL_BASED_OUTPATIENT_CLINIC_OR_DEPARTMENT_OTHER): Payer: 59 | Admitting: Nurse Practitioner

## 2014-07-14 ENCOUNTER — Ambulatory Visit (HOSPITAL_BASED_OUTPATIENT_CLINIC_OR_DEPARTMENT_OTHER): Payer: 59

## 2014-07-14 VITALS — BP 109/28 | HR 98 | Temp 97.7°F | Resp 18 | Ht 71.0 in | Wt 100.7 lb

## 2014-07-14 DIAGNOSIS — E860 Dehydration: Secondary | ICD-10-CM

## 2014-07-14 DIAGNOSIS — R630 Anorexia: Secondary | ICD-10-CM

## 2014-07-14 DIAGNOSIS — C155 Malignant neoplasm of lower third of esophagus: Secondary | ICD-10-CM

## 2014-07-14 DIAGNOSIS — R42 Dizziness and giddiness: Secondary | ICD-10-CM

## 2014-07-14 DIAGNOSIS — I951 Orthostatic hypotension: Secondary | ICD-10-CM

## 2014-07-14 DIAGNOSIS — R110 Nausea: Secondary | ICD-10-CM

## 2014-07-14 LAB — CBC WITH DIFFERENTIAL/PLATELET
BASO%: 2.3 % — ABNORMAL HIGH (ref 0.0–2.0)
Basophils Absolute: 0.1 10*3/uL (ref 0.0–0.1)
EOS%: 1.1 % (ref 0.0–7.0)
Eosinophils Absolute: 0 10*3/uL (ref 0.0–0.5)
HEMATOCRIT: 35.3 % — AB (ref 38.4–49.9)
HGB: 11.5 g/dL — ABNORMAL LOW (ref 13.0–17.1)
LYMPH%: 24.2 % (ref 14.0–49.0)
MCH: 31.7 pg (ref 27.2–33.4)
MCHC: 32.5 g/dL (ref 32.0–36.0)
MCV: 97.4 fL (ref 79.3–98.0)
MONO#: 0.6 10*3/uL (ref 0.1–0.9)
MONO%: 16.8 % — AB (ref 0.0–14.0)
NEUT#: 2 10*3/uL (ref 1.5–6.5)
NEUT%: 55.6 % (ref 39.0–75.0)
Platelets: 204 10*3/uL (ref 140–400)
RBC: 3.63 10*6/uL — ABNORMAL LOW (ref 4.20–5.82)
RDW: 16.2 % — AB (ref 11.0–14.6)
WBC: 3.7 10*3/uL — ABNORMAL LOW (ref 4.0–10.3)
lymph#: 0.9 10*3/uL (ref 0.9–3.3)

## 2014-07-14 LAB — COMPREHENSIVE METABOLIC PANEL (CC13)
AST: 11 U/L (ref 5–34)
Albumin: 2.7 g/dL — ABNORMAL LOW (ref 3.5–5.0)
Alkaline Phosphatase: 108 U/L (ref 40–150)
Anion Gap: 12 mEq/L — ABNORMAL HIGH (ref 3–11)
BILIRUBIN TOTAL: 0.56 mg/dL (ref 0.20–1.20)
BUN: 6.5 mg/dL — ABNORMAL LOW (ref 7.0–26.0)
CHLORIDE: 103 meq/L (ref 98–109)
CO2: 26 mEq/L (ref 22–29)
Calcium: 10.8 mg/dL — ABNORMAL HIGH (ref 8.4–10.4)
Creatinine: 0.9 mg/dL (ref 0.7–1.3)
Glucose: 155 mg/dl — ABNORMAL HIGH (ref 70–140)
Potassium: 3.4 mEq/L — ABNORMAL LOW (ref 3.5–5.1)
Sodium: 141 mEq/L (ref 136–145)
TOTAL PROTEIN: 6.4 g/dL (ref 6.4–8.3)

## 2014-07-14 MED ORDER — PREDNISONE 20 MG PO TABS: 20.0000 mg | ORAL_TABLET | Freq: Every day | ORAL | Status: AC

## 2014-07-14 MED ORDER — SODIUM CHLORIDE 0.9 % IV SOLN
INTRAVENOUS | Status: DC
  Administered 2014-07-14: 11:00:00 via INTRAVENOUS
  Filled 2014-07-14: qty 1000

## 2014-07-14 MED ORDER — ZOLEDRONIC ACID 4 MG/100ML IV SOLN
4.0000 mg | Freq: Once | INTRAVENOUS | Status: AC
  Administered 2014-07-14: 4 mg via INTRAVENOUS
  Filled 2014-07-14: qty 100

## 2014-07-14 NOTE — Telephone Encounter (Signed)
Patient confirmed 02/05 appointment. Will call back incase 02/04 appointment to late.

## 2014-07-14 NOTE — Patient Instructions (Signed)

## 2014-07-14 NOTE — Progress Notes (Addendum)
[facility] OFFICE PROGRESS NOTE   Diagnosis:  Esophagus cancer  INTERVAL HISTORY:   Brandon [name] returns as scheduled. He completed cycle 3 FOLFOX 06/28/2014. He was referred for a brain CT on 07/06/2014 due to persistent nausea and dizziness. There was no evidence of metastatic disease. He continues to have dizziness mainly with position changes. He also continues to have nausea. No vomiting. He denies diarrhea. Appetite is poor. No neuropathy symptoms. No dysphagia. He notes increased abdominal pain upon awakening in the mornings. He takes oxycodone as needed.  Objective:  Vital signs in last 24 hours:  Blood pressure 114/66, pulse 115, temperature 97.7 F (36.5 C), temperature source Oral, resp. rate 18, height 5\' 11"  (1.803 m), weight 100 lb 11.2 oz (45.677 kg), SpO2 100 %.    HEENT: No thrush or ulcers. Resp: Lungs clear. Cardio: Regular, tachycardic. GI: No hepatomegaly. Tender at the upper abdomen. Vascular: No leg edema. Neuro: Alert and oriented.  Skin: Decreased skin turgor. Port-A-Cath without erythema.    Lab Results:  Lab Results  Component Value Date   WBC 3.7* 07/14/2014   HGB 11.5* 07/14/2014   HCT 35.3* 07/14/2014   MCV 97.4 07/14/2014   PLT 204 07/14/2014   NEUTROABS 2.0 07/14/2014    Imaging:  No results found.  Medications: I have reviewed the patient's current medications.  Assessment/Plan: 1. 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  EGD by Dr. Collene Mares on 08/26/2013 with findings of a malignant appearing friable mass causing stenosis at 33 cm from the incisors extending to the GE junction. Stomach and the proximal small bowel were not visualized.   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   Staging PET scan on 09/03/2013 showed a 6.5 cm segment of intense hypermetabolic activity associated with the mid to distal esophagus with SUV max 24.5. There was no  evidence of mediastinal metastasis, gastrohepatic ligament nodal metastasis or liver metastasis.   Initiation of radiation 09/14/2013 and concurrent weekly Taxol/carboplatin on 09/15/2013. Cycle 5 Taxol/carboplatin 10/12/2013. Radiation completed 10/26/2013.   Restaging CTs 11/10/2013 with no evidence of metastatic disease and improvement in the esophagus wall thickening.  CT abdomen/pelvis 05/12/2014 with new extensive lymphadenopathy in the upper abdomen.  Cycle 1 FOLFOX 06/01/2014  Cycle 2 FOLFOX 06/14/2014  CT angio chest/abdomen/pelvis ordered by Dr. Oneida Alar 06/24/2014 showed interval progression of upper abdominal confluent mesenteric adenopathy; retroperitoneal and retrocrural adenopathy stable.  Cycle 3 FOLFOX 06/28/2014 2. Dysphagia secondary to #1. Initially improved, recurrent dysphagia October 2015. Improved. 3. Weight loss secondary to #1. 4. Peripheral vascular disease. 5. History of mesenteric ischemia. 6. 4-5 mm hyperpigmented mole at the left upper back 7. Hypercalcemia status post Zometa 05/13/2014. 8. Persistent nausea and dizziness status post negative brain CT 07/06/2014. 9. Abdominal pain secondary to #1.   Disposition: Brandon [name] has completed 3 cycles of FOLFOX. His performance status is declining. Dr. Benay Spice recommends placing the current chemotherapy on hold and discussed switching to a different chemotherapy regimen versus a supportive care approach with a hospice referral with Mr. Remmert. He is undecided. He will consider these options over the next several days and let us know his decision when he returns for a follow-up visit on 07/16/2014.  He has orthostatic hypotension due to dehydration. He will receive IV fluids today.  He has recurrent hypercalcemia and will receive Zometa.  A prescription was sent to his pharmacy for prednisone 20 mg daily for appetite stimulation.  As noted above we  will see him in follow-up on 07/16/2014. He will contact  the office in the interim with any problems.  Patient seen with Dr. Benay Spice. 25 minutes were spent face-to-face at today's visit with the majority of that time involved in counseling/coordination of care.      Ned Card ANP/GNP-BC   07/14/2014  9:57 AM  This was a shared visit with Ned Card. Brandon [name] continues to have a poor performance status.  We will discontinue folfox.  We discussed Hospice care.  He will return for further discussion on 2/5.  Julieanne Manson, MD

## 2014-07-14 NOTE — Telephone Encounter (Signed)
Per staff message and POF I have scheduled appts. Advised scheduler of appts. JMW  

## 2014-07-15 ENCOUNTER — Ambulatory Visit: Admission: RE | Admit: 2014-07-15 | Payer: 59 | Source: Ambulatory Visit | Admitting: Radiation Oncology

## 2014-07-15 ENCOUNTER — Encounter: Payer: Self-pay | Admitting: Nutrition

## 2014-07-15 ENCOUNTER — Telehealth: Payer: Self-pay | Admitting: *Deleted

## 2014-07-15 NOTE — Telephone Encounter (Signed)
Called patient to ask question, lvm for a return call 

## 2014-07-15 NOTE — Progress Notes (Signed)
Patient cancelled nutrition appointment today.  States he doesn't feel well.  Did not wish to reschedule.

## 2014-07-16 ENCOUNTER — Telehealth: Payer: Self-pay | Admitting: Oncology

## 2014-07-16 ENCOUNTER — Ambulatory Visit: Payer: 59

## 2014-07-16 ENCOUNTER — Ambulatory Visit (HOSPITAL_BASED_OUTPATIENT_CLINIC_OR_DEPARTMENT_OTHER): Payer: 59 | Admitting: Nurse Practitioner

## 2014-07-16 ENCOUNTER — Encounter: Payer: Self-pay | Admitting: Nutrition

## 2014-07-16 VITALS — BP 134/86 | HR 112 | Resp 17 | Ht 71.0 in | Wt 102.5 lb

## 2014-07-16 DIAGNOSIS — R110 Nausea: Secondary | ICD-10-CM

## 2014-07-16 DIAGNOSIS — R42 Dizziness and giddiness: Secondary | ICD-10-CM

## 2014-07-16 DIAGNOSIS — I739 Peripheral vascular disease, unspecified: Secondary | ICD-10-CM

## 2014-07-16 DIAGNOSIS — R109 Unspecified abdominal pain: Secondary | ICD-10-CM

## 2014-07-16 DIAGNOSIS — C155 Malignant neoplasm of lower third of esophagus: Secondary | ICD-10-CM

## 2014-07-16 DIAGNOSIS — K5900 Constipation, unspecified: Secondary | ICD-10-CM

## 2014-07-16 MED ORDER — MORPHINE SULFATE ER 30 MG PO TBCR: 30.0000 mg | EXTENDED_RELEASE_TABLET | Freq: Two times a day (BID) | ORAL | Status: DC

## 2014-07-16 NOTE — Patient Instructions (Signed)
Begin miralax daily for constipation Begin MS Contin 30 mg every 12 hours for pain (long acting pain medicine) Continue oxycodone as needed for breakthrough pain

## 2014-07-16 NOTE — Telephone Encounter (Signed)
gv and printed appt sched and avs for pt for Feb  °

## 2014-07-16 NOTE — Progress Notes (Addendum)
[place] [facility] OFFICE PROGRESS NOTE   Diagnosis:  Esophagus cancer  INTERVAL HISTORY:   Brandon [name] returns as scheduled. He received IV fluids and Zometa on 07/14/2014. He noted the dizziness improved following the IV fluids and overall he felt better. He woke up this morning with recurrent dizziness and weakness and also increased abdominal pain. He takes oxycodone as needed with short-term partial relief. Appetite was somewhat better yesterday. He is having intermittent nausea. No vomiting. No diarrhea.  Objective:  Vital signs in last 24 hours:  Blood pressure 134/86, pulse 112, resp. rate 17, height 5\' 11"  (1.803 m), weight 102 lb 8 oz (46.494 kg), SpO2 100 %. repeat heart rate 84    HEENT: No thrush or ulcers. Resp: Distant breath sounds. Lungs are clear. Cardio: Regular rate and rhythm. GI: No hepatomegaly. Tenderness at the upper abdomen. Vascular: No leg edema. Neuro: Alert and oriented. Skin: Decreased skin turgor. Port-A-Cath without erythema.    Lab Results:  Lab Results  Component Value Date   WBC 3.7* 07/14/2014   HGB 11.5* 07/14/2014   HCT 35.3* 07/14/2014   MCV 97.4 07/14/2014   PLT 204 07/14/2014   NEUTROABS 2.0 07/14/2014    Imaging:  No results found.  Medications: I have reviewed the patient's current medications.  Assessment/Plan: 1. 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  EGD by Dr. Collene Mares on 08/26/2013 with findings of a malignant appearing friable mass causing stenosis at 33 cm from the incisors extending to the GE junction. Stomach and the proximal small bowel were not visualized.   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   Staging PET scan on 09/03/2013 showed a 6.5 cm segment of intense hypermetabolic activity associated with the mid to distal esophagus with SUV max 24.5. There was no evidence of mediastinal metastasis, gastrohepatic ligament nodal  metastasis or liver metastasis.   Initiation of radiation 09/14/2013 and concurrent weekly Taxol/carboplatin on 09/15/2013. Cycle 5 Taxol/carboplatin 10/12/2013. Radiation completed 10/26/2013.   Restaging CTs 11/10/2013 with no evidence of metastatic disease and improvement in the esophagus wall thickening.  CT abdomen/pelvis 05/12/2014 with new extensive lymphadenopathy in the upper abdomen.  Cycle 1 FOLFOX 06/01/2014  Cycle 2 FOLFOX 06/14/2014  CT angio chest/abdomen/pelvis ordered by Dr. Oneida Alar 06/24/2014 showed interval progression of upper abdominal confluent mesenteric adenopathy; retroperitoneal and retrocrural adenopathy stable.  Cycle 3 FOLFOX 06/28/2014 2. Dysphagia secondary to #1. Initially improved, recurrent dysphagia October 2015. Improved. 3. Weight loss secondary to #1. 4. Peripheral vascular disease. 5. History of mesenteric ischemia. 6. 4-5 mm hyperpigmented mole at the left upper back 7. Hypercalcemia status post Zometa 05/13/2014. 8. Persistent nausea and dizziness status post negative brain CT 07/06/2014. 9. Abdominal pain secondary to #1.   Disposition: Brandon [name]' performance status continues to decline. He understands this is secondary to the cancer. We recommended a supportive care approach with a hospice referral. He is in agreement. We will make a referral to the [facility] program. Discussion regarding CODE STATUS was initiated. He is undecided regarding CODE STATUS. We will ask the hospice nurse to discuss with him further.  For pain he will begin MS Contin 30 mg every 12 hours and continue oxycodone as needed.  For the constipation he will begin Miralax daily.  We scheduled a return visit in approximately 2 weeks. He will contact the office in the interim with any problems.  Patient seen with Dr. Benay Spice.    Ned Card ANP/GNP-BC  07/16/2014  12:11 PM  This was a shared visit with Ned Card. He continues to have a poor  performance status. I do not recommend further chemotherapy. He agrees to the [facility] referral. We added MS Contin for pain.  Julieanne Manson, M.D.

## 2014-07-16 NOTE — Telephone Encounter (Signed)
gv adn printed appt sched anda vs for pt for Feb °

## 2014-07-19 ENCOUNTER — Telehealth: Payer: Self-pay

## 2014-07-19 ENCOUNTER — Telehealth: Payer: Self-pay | Admitting: *Deleted

## 2014-07-19 NOTE — Telephone Encounter (Signed)
Faxed hospice referral to [facility].  

## 2014-07-19 NOTE — Telephone Encounter (Signed)
Received call from [facility] with Hospice and [place] stating that she received referral for patient today. Upon calling patient to set up initial visit, patient states he is not ready for Hospice yet. She states she told the patient the call back to [facility] once he is ready and another referral will need to be placed.  Will route message to Ned Card, NP who saw patient on 07/16/14.

## 2014-07-23 ENCOUNTER — Encounter (HOSPITAL_COMMUNITY): Payer: Self-pay | Admitting: Emergency Medicine

## 2014-07-23 ENCOUNTER — Emergency Department (HOSPITAL_COMMUNITY)
Admission: EM | Admit: 2014-07-23 | Discharge: 2014-07-24 | Disposition: A | Discharge status: Home / Self Care | Payer: 59 | Attending: Emergency Medicine | Admitting: Emergency Medicine

## 2014-07-23 DIAGNOSIS — K5900 Constipation, unspecified: Secondary | ICD-10-CM | POA: Insufficient documentation

## 2014-07-23 DIAGNOSIS — Z720 Tobacco use: Secondary | ICD-10-CM | POA: Insufficient documentation

## 2014-07-23 DIAGNOSIS — Z7952 Long term (current) use of systemic steroids: Secondary | ICD-10-CM | POA: Insufficient documentation

## 2014-07-23 DIAGNOSIS — Z9889 Other specified postprocedural states: Secondary | ICD-10-CM | POA: Diagnosis not present

## 2014-07-23 DIAGNOSIS — Z79899 Other long term (current) drug therapy: Secondary | ICD-10-CM | POA: Diagnosis not present

## 2014-07-23 DIAGNOSIS — Z8501 Personal history of malignant neoplasm of esophagus: Secondary | ICD-10-CM | POA: Insufficient documentation

## 2014-07-23 DIAGNOSIS — E860 Dehydration: Secondary | ICD-10-CM | POA: Diagnosis not present

## 2014-07-23 DIAGNOSIS — R109 Unspecified abdominal pain: Secondary | ICD-10-CM | POA: Diagnosis present

## 2014-07-23 DIAGNOSIS — K5641 Fecal impaction: Secondary | ICD-10-CM

## 2014-07-23 DIAGNOSIS — K5903 Drug induced constipation: Secondary | ICD-10-CM

## 2014-07-23 LAB — CBC WITH DIFFERENTIAL/PLATELET
Basophils Absolute: 0 10*3/uL (ref 0.0–0.1)
Basophils Relative: 0 % (ref 0–1)
EOS ABS: 0 10*3/uL (ref 0.0–0.7)
EOS PCT: 0 % (ref 0–5)
HEMATOCRIT: 29.4 % — AB (ref 39.0–52.0)
Hemoglobin: 10 g/dL — ABNORMAL LOW (ref 13.0–17.0)
Lymphocytes Relative: 4 % — ABNORMAL LOW (ref 12–46)
Lymphs Abs: 0.6 10*3/uL — ABNORMAL LOW (ref 0.7–4.0)
MCH: 32.1 pg (ref 26.0–34.0)
MCHC: 34 g/dL (ref 30.0–36.0)
MCV: 94.2 fL (ref 78.0–100.0)
MONOS PCT: 10 % (ref 3–12)
Monocytes Absolute: 1.5 10*3/uL — ABNORMAL HIGH (ref 0.1–1.0)
NEUTROS ABS: 13.3 10*3/uL — AB (ref 1.7–7.7)
Neutrophils Relative %: 86 % — ABNORMAL HIGH (ref 43–77)
Platelets: 240 10*3/uL (ref 150–400)
RBC: 3.12 MIL/uL — ABNORMAL LOW (ref 4.22–5.81)
RDW: 17.3 % — ABNORMAL HIGH (ref 11.5–15.5)
WBC: 15.4 10*3/uL — ABNORMAL HIGH (ref 4.0–10.5)

## 2014-07-23 LAB — COMPREHENSIVE METABOLIC PANEL WITH GFR
ALT: 11 U/L (ref 0–53)
AST: 17 U/L (ref 0–37)
Albumin: 2.6 g/dL — ABNORMAL LOW (ref 3.5–5.2)
Alkaline Phosphatase: 92 U/L (ref 39–117)
Anion gap: 9 (ref 5–15)
BUN: 15 mg/dL (ref 6–23)
CO2: 23 mmol/L (ref 19–32)
Calcium: 8.5 mg/dL (ref 8.4–10.5)
Chloride: 105 mmol/L (ref 96–112)
Creatinine, Ser: 0.54 mg/dL (ref 0.50–1.35)
GFR calc Af Amer: >90 mL/min (ref >90)
GFR calc non Af Amer: >90 mL/min (ref >90)
Glucose, Bld: 123 mg/dL — ABNORMAL HIGH (ref 70–99)
Potassium: 3 mmol/L — ABNORMAL LOW (ref 3.5–5.1)
Sodium: 137 mmol/L (ref 135–145)
Total Bilirubin: 0.5 mg/dL (ref 0.3–1.2)
Total Protein: 6.2 g/dL (ref 6.0–8.3)

## 2014-07-23 LAB — POC OCCULT BLOOD, ED: FECAL OCCULT BLD: POSITIVE — AB

## 2014-07-23 LAB — LIPASE, BLOOD: Lipase: 25 U/L (ref 11–59)

## 2014-07-23 MED ORDER — ONDANSETRON HCL 4 MG/2ML IJ SOLN
4.0000 mg | Freq: Once | INTRAMUSCULAR | Status: AC
  Administered 2014-07-23: 4 mg via INTRAVENOUS
  Filled 2014-07-23: qty 2

## 2014-07-23 MED ORDER — MILK AND MOLASSES ENEMA
1.0000 | Freq: Once | RECTAL | Status: AC
  Administered 2014-07-23: 250 mL via RECTAL
  Filled 2014-07-23: qty 250

## 2014-07-23 MED ORDER — POTASSIUM CHLORIDE CRYS ER 20 MEQ PO TBCR
40.0000 meq | EXTENDED_RELEASE_TABLET | Freq: Once | ORAL | Status: AC
  Administered 2014-07-23: 40 meq via ORAL
  Filled 2014-07-23: qty 2

## 2014-07-23 MED ORDER — MORPHINE SULFATE 4 MG/ML IJ SOLN
4.0000 mg | Freq: Once | INTRAMUSCULAR | Status: AC
  Administered 2014-07-23: 4 mg via INTRAVENOUS
  Filled 2014-07-23: qty 1

## 2014-07-23 MED ORDER — SODIUM CHLORIDE 0.9 % IV BOLUS (SEPSIS)
1000.0000 mL | Freq: Once | INTRAVENOUS | Status: AC
  Administered 2014-07-23: 1000 mL via INTRAVENOUS

## 2014-07-23 NOTE — ED Notes (Signed)
Pt still unable to void at this time 

## 2014-07-23 NOTE — ED Notes (Signed)
Pt BIB EMS, hx stage IV abdominal CA, stopped chemo treatments x3 weeks ago, stopped pain medication x3 days ago d/t c/o constipation. Pt reported x4 days without BM, only able to drink Gatorade and [facility] for the past 4 days. However pt reported to EMS to have had a BM en route, and now feels better. Pain controlled at this time. Denies further complaints.

## 2014-07-23 NOTE — ED Provider Notes (Signed)
CSN: [id number]     Arrival date & time 07/23/14  1620 History   First MD Initiated Contact with Patient 07/23/14 1625     Chief Complaint  Patient presents with  . Abdominal Pain   Brandon [name] is a 59 y.o. male with a history of stage IV stomach and esophageal cancer who presents to the ED by EMS complaining of abdominal pain, constipation, lightheadedness and dizziness over the past 4 days. Patient reports he had not had a bowel movement in 4 days until just prior to arrival. Patient reports his abdominal pain has improved and he currently rates at a 4 out of 10. He reports his pain is generalized to his abdomen. Patient reports it feels like his typical abdominal pain. Patient reports he stopped taking his pain medicines at home due to taking was causing constipation. Patient also reports feeling lightheaded and dizzy especially while standing. Patient reports drinking Gatorade and [facility] but not eating much recently. Per medical records the patient has been recommended for palliative care and hospice, however the patient seems very unaware of his current medical condition. He is unsure what the plan is for his cancer or if he is still to have chemotherapy. He denies fevers, chills, vomiting, dysuria, hematuria, difficulty urinating, headaches, changes in vision, chest pain, shortness breath, wheezing, cough, or rashes.   (Consider location/radiation/quality/duration/timing/severity/associated sxs/prior Treatment) HPI  Past Medical History  Diagnosis Date  . Cancer 08/26/13    esophageal path pending  . ETOH abuse   . Tobacco abuse   . 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 History of aorta-iliac-femoral bypass   . History of radiation therapy 09/14/13-10/26/13    esophageal ca/50.4Gy/48fx   Past Surgical History  Procedure Laterality Date  . Cardiac surgery      femoral bypass surgery  . Visceral angiogram N/A 11/25/2013    Procedure: VISCERAL  ANGIOGRAM;  Surgeon: Serafina Mitchell, MD;  Location: [facility] CATH LAB;  Service: Cardiovascular;  Laterality: N/A;  . Esophagogastroduodenoscopy N/A 06/18/2014    Procedure: ESOPHAGOGASTRODUODENOSCOPY (EGD);  Surgeon: Beryle Beams, MD;  Location: [facility] ENDOSCOPY;  Service: Endoscopy;  Laterality: N/A;  . Balloon dilation N/A 06/18/2014    Procedure: BALLOON DILATION;  Surgeon: Beryle Beams, MD;  Location: [facility] ENDOSCOPY;  Service: Endoscopy;  Laterality: N/A;   Family History  Problem Relation Age of Onset  . Stroke Mother    History  Substance Use Topics  . Smoking status: Current Every Day Smoker -- 1.00 packs/day for 40 years    Types: Cigarettes    Start date: 08/31/2013  . Smokeless tobacco: Never Used     Comment: down to 1 cigarette daily  . Alcohol Use: Yes     Comment: 12 beer weekly  since last 3 months    Review of Systems  Constitutional: Negative for fever and chills.  HENT: Negative for congestion, sore throat and trouble swallowing.   Eyes: Negative for pain and visual disturbance.  Respiratory: Negative for cough, shortness of breath and wheezing.   Cardiovascular: Negative for chest pain and palpitations.  Gastrointestinal: Positive for nausea, abdominal pain and constipation. Negative for vomiting, diarrhea and blood in stool.  Genitourinary: Negative for dysuria and difficulty urinating.  Musculoskeletal: Negative for back pain and neck pain.  Skin: Negative for rash.  Neurological: Positive for dizziness. Negative for syncope, light-headedness, numbness and headaches.      Allergies  Nsaids  Home Medications  Prior to Admission medications   Medication Sig Start Date End Date Taking? Authorizing Provider  CARAFATE 1 GM/10ML suspension Take 10 mLs by mouth 4 (four) times daily as needed. 04/02/14  Yes Historical Provider, MD  HYDROcodone-acetaminophen (NORCO) 5-325 MG per tablet Take 1-2 tablets by mouth every 6 (six) hours as needed for moderate pain. 06/14/14   Yes Owens Shark, NP  lidocaine-prilocaine (EMLA) cream Apply 1 application topically as needed. Apply approx 30 minutes prior to accessing port. 05/13/14  Yes Drue Second, NP  morphine (MS CONTIN) 30 MG 12 hr tablet Take 1 tablet (30 mg total) by mouth every 12 (twelve) hours. 07/16/14  Yes Owens Shark, NP  ondansetron (ZOFRAN ODT) 8 MG disintegrating tablet Take 1 tablet (8 mg total) by mouth every 8 (eight) hours as needed for nausea or vomiting. 06/28/14  Yes Drue Second, NP  oxyCODONE (OXY IR/ROXICODONE) 5 MG immediate release tablet Take 1-2 tablets (5-10 mg total) by mouth every 6 (six) hours as needed for severe pain. 07/06/14  Yes Owens Shark, NP  potassium chloride SA (K-DUR,KLOR-CON) 20 MEQ tablet Take 1 tablet (20 mEq total) by mouth daily. 06/14/14  Yes Owens Shark, NP  predniSONE (DELTASONE) 20 MG tablet Take 1 tablet (20 mg total) by mouth daily with breakfast. 07/14/14  Yes Owens Shark, NP  promethazine (PHENERGAN) 25 MG tablet Take 0.5-1 tablets (12.5-25 mg total) by mouth every 6 (six) hours as needed for nausea or vomiting. 07/06/14  Yes Owens Shark, NP  prochlorperazine (COMPAZINE) 10 MG tablet Take 1 tablet (10 mg total) by mouth every 6 (six) hours as needed for nausea or vomiting. Patient not taking: Reported on 07/14/2014 05/13/14   Drue Second, NP  Sennosides (EX-LAX PO) Take 1 tablet by mouth daily as needed (Constipation).    Historical Provider, MD  traMADol (ULTRAM) 50 MG tablet Take 1 tablet (50 mg total) by mouth every 6 (six) hours as needed. Patient not taking: Reported on 07/14/2014 06/01/14   Drue Second, NP   BP 101/75 mmHg  Pulse 100  Temp(Src) 97.8 F (36.6 C) (Oral)  Resp 18  SpO2 100% Physical Exam  Constitutional: He is oriented to person, place, and time. He appears well-developed and well-nourished. No distress.  Frail male. Non-toxic appearing.   HENT:  Head: Normocephalic and atraumatic.  Right Ear: External ear normal.  Left Ear: External  ear normal.  Nose: Nose normal.  Mouth/Throat: Oropharynx is clear and moist. No oropharyngeal exudate.  Bilateral tympanic members are pearly-gray without erythema or loss of landmarks. No temporal edema.  Eyes: Conjunctivae are normal. Pupils are equal, round, and reactive to light. Right eye exhibits no discharge. Left eye exhibits no discharge.  Neck: Normal range of motion. Neck supple. No JVD present.  Cardiovascular: Normal rate, regular rhythm, normal heart sounds and intact distal pulses.   Pulmonary/Chest: Effort normal and breath sounds normal. No respiratory distress. He has no wheezes. He has no rales.  Abdominal: Soft. Bowel sounds are normal. He exhibits no distension and no mass. There is tenderness. There is no rebound and no guarding.  Abdomen is soft. Bowel sounds are present. Patient's abdomen is generally tender to palpation. No rebound tenderness. No peritoneal signs.   Genitourinary:  Digital rectal exam performed by me with male nurse tech chaperone. Patient is impacted. Large amount of stool removed. Stool is not bloody.   Musculoskeletal: He exhibits no edema.  Lymphadenopathy:    He has no cervical adenopathy.  Neurological: He is alert and oriented to person, place, and time. Coordination normal.  Skin: Skin is warm and dry. No rash noted. He is not diaphoretic. No erythema. No pallor.  Psychiatric: He has a normal mood and affect. His behavior is normal.  Nursing note and vitals reviewed.   ED Course  Procedures (including critical care time) Labs Review Labs Reviewed  COMPREHENSIVE METABOLIC PANEL - Abnormal; Notable for the following:    Potassium 3.0 (*)    Glucose, Bld 123 (*)    Albumin 2.6 (*)    All other components within normal limits  CBC WITH DIFFERENTIAL/PLATELET - Abnormal; Notable for the following:    WBC 15.4 (*)    RBC 3.12 (*)    Hemoglobin 10.0 (*)    HCT 29.4 (*)    RDW 17.3 (*)    Neutrophils Relative % 86 (*)    Neutro Abs 13.3  (*)    Lymphocytes Relative 4 (*)    Lymphs Abs 0.6 (*)    Monocytes Absolute 1.5 (*)    All other components within normal limits  POC OCCULT BLOOD, ED - Abnormal; Notable for the following:    Fecal Occult Bld POSITIVE (*)    All other components within normal limits  LIPASE, BLOOD  URINALYSIS, ROUTINE W REFLEX MICROSCOPIC  OCCULT BLOOD X 1 CARD TO LAB, STOOL    Imaging Review No results found.   EKG Interpretation   Date/Time:  Friday July 23 2014 16:31:24 EST Ventricular Rate:  90 PR Interval:  120 QRS Duration: 79 QT Interval:  384 QTC Calculation: 470 R Axis:   -65 Text Interpretation:  Normal sinus rhythm Left anterior fascicular block  Borderline low voltage, extremity leads Nonspecific repol abnormality,  lateral leads Baseline wander in lead(s) V4 V5 No significant change since  June 2015 Confirmed by Regenia Skeeter  MD, SCOTT [phone]) on 07/23/2014 6:32:10 PM      Filed Vitals:   07/23/14 1631 07/23/14 1900 07/23/14 2148 07/24/14 0018  BP: 147/89  123/80 101/75  Pulse: 92 82 86 100  Temp: 97.8 F (36.6 C)  97.8 F (36.6 C)   TempSrc: Oral  Oral   Resp: 18 15 18    SpO2: 100% 100% 96% 100%     MDM   Meds given in ED:  Medications  sodium chloride 0.9 % bolus 1,000 mL (0 mLs Intravenous Stopped 07/23/14 1845)  ondansetron (ZOFRAN) injection 4 mg (4 mg Intravenous Given 07/23/14 1742)  morphine 4 MG/ML injection 4 mg (4 mg Intravenous Given 07/23/14 1742)  potassium chloride SA (K-DUR,KLOR-CON) CR tablet 40 mEq (40 mEq Oral Given 07/23/14 1927)  milk and molasses enema (250 mLs Rectal Given 07/23/14 2058)  heparin lock flush 100 unit/mL (500 Units Intracatheter Given 07/24/14 0026)    Discharge Medication List as of 07/24/2014 12:12 AM      Final diagnoses:  Constipation due to pain medication  Fecal impaction  Mild dehydration   This  is a 59 y.o. male with a history of stage IV stomach and esophageal cancer who presents to the ED by EMS complaining of  abdominal pain, constipation, lightheadedness and dizziness over the past 4 days. According to his notes he is getting palliative care.  He reports he has not had a bowel movement in 4 days and has been straining to stool. Patient is afebrile and nontoxic-appearing. The patient's abdomen is generally tender to palpation, without peritoneal signs. Patient had a CT of his head which was negative on 07/06/2014.  Patient's CMP shows a potassium of 3.0. The patient is given oral potassium in the ED. Patient's lipase is 25. Patient does have a mild leukocytosis with a white count of 15.4.  He also has a hemoglobin of 10.0, which was 11.5 two weeks ago. Patient has fecal impaction on exam. Stool is not grossly bloody. Impaction removed as best I could and then patient given enema which he tolerated well. He had several good bowel movements. At his final revaluation the patient reports feeling much better and denies any pain or nausea. The patient was able to ambulate without difficulty and would like to be discharged. He denied feeling lightheaded or dizzy upon standing. I advised the patient to continue to use MiraLAX as prescribed and to take it twice a day until he is having soft stools, then decrease to once a day. The patient reports he has MiraLAX at home, and does not need a prescription. The patient tolerated by mouth fluids as well as a sandwich while in the ED. He reports feeling much better. The patient reports he has several doctors he can follow-up with. I advised him he needs to follow-up at the beginning of next week. I advised him to take his home pain medicines as needed for pain, as prescribed. Strict return precautions are provided. Advised patient to return to the emergency department with new or worsening symptoms or new concerns. The patient verbalized understanding and agreement with plan.  This patient was discussed with and evaluated by Dr. Regenia Skeeter who agrees with assessment and  plan.     Hanley Hays, PA-C 07/24/14 0127  Ephraim Hamburger, MD 07/26/14 [phone]

## 2014-07-23 NOTE — ED Notes (Signed)
Bed: WS56 Expected date:  Expected time:  Means of arrival:  Comments: EMS-CANCER STOMACH PT

## 2014-07-24 MED ORDER — HEPARIN SOD (PORK) LOCK FLUSH 100 UNIT/ML IV SOLN
500.0000 [IU] | Freq: Once | INTRAVENOUS | Status: AC
  Administered 2014-07-24: 500 [IU]
  Filled 2014-07-24: qty 5

## 2014-07-24 NOTE — ED Notes (Signed)
Pt. Unable to give urine at this time. 

## 2014-07-24 NOTE — ED Notes (Signed)
Ambulated pt. Pt walked with a steady gait at a normal pace with no needed assistance.

## 2014-07-24 NOTE — Discharge Instructions (Signed)
Constipation  Constipation is when a person has fewer than three bowel movements a week, has difficulty having a bowel movement, or has stools that are dry, hard, or larger than normal. As people grow older, constipation is more common. If you try to fix constipation with medicines that make you have a bowel movement (laxatives), the problem may get worse. Long-term laxative use may cause the muscles of the colon to become weak. A low-fiber diet, not taking in enough fluids, and taking certain medicines may make constipation worse.  CAUSES   Certain medicines, such as antidepressants, pain medicine, iron supplements, antacids, and water pills.   Certain diseases, such as diabetes, irritable bowel syndrome (IBS), thyroid disease, or depression.   Not drinking enough water.   Not eating enough fiber-rich foods.   Stress or travel.   Lack of physical activity or exercise.   Ignoring the urge to have a bowel movement.   Using laxatives too much.  SIGNS AND SYMPTOMS   Having fewer than three bowel movements a week.   Straining to have a bowel movement.   Having stools that are hard, dry, or larger than normal.   Feeling full or bloated.   Pain in the lower abdomen.   Not feeling relief after having a bowel movement.  DIAGNOSIS  Your health care provider will take a medical history and perform a physical exam. Further testing may be done for severe constipation. Some tests may include:  A barium enema X-ray to examine your rectum, colon, and, sometimes, your small intestine.   A sigmoidoscopy to examine your lower colon.   A colonoscopy to examine your entire colon. TREATMENT  Treatment will depend on the severity of your constipation and what is causing it. Some dietary treatments include drinking more fluids and eating more fiber-rich foods. Lifestyle treatments may include regular exercise. If these diet and lifestyle recommendations do not help, your health  care provider may recommend taking over-the-counter laxative medicines to help you have bowel movements. Prescription medicines may be prescribed if over-the-counter medicines do not work.  HOME CARE INSTRUCTIONS   Eat foods that have a lot of fiber, such as fruits, vegetables, whole grains, and beans.  Limit foods high in fat and processed sugars, such as french fries, hamburgers, cookies, candies, and soda.   A fiber supplement may be added to your diet if you cannot get enough fiber from foods.   Drink enough fluids to keep your urine clear or pale yellow.   Exercise regularly or as directed by your health care provider.   Go to the restroom when you have the urge to go. Do not hold it.   Only take over-the-counter or prescription medicines as directed by your health care provider. Do not take other medicines for constipation without talking to your health care provider first.  [place] IF:   You have bright red blood in your stool.   Your constipation lasts for more than 4 days or gets worse.   You have abdominal or rectal pain.   You have thin, pencil-like stools.   You have unexplained weight loss. MAKE SURE YOU:   Understand these instructions.  Will watch your condition.  Will get help right away if you are not doing well or get worse. Document Released: 02/24/2004 Document Revised: 06/02/2013 Document Reviewed: 03/09/2013 [facility] Patient Information 2015 [place], [place]. This information is not intended to replace advice given to you by your health care provider. Make sure you discuss any  questions you have with your health care provider.  Dehydration, Adult Dehydration is when you lose more fluids from the body than you take in. Vital organs like the kidneys, brain, and heart cannot function without a proper amount of fluids and salt. Any loss of fluids from the body can cause dehydration.  CAUSES   Vomiting.  Diarrhea.  Excessive  sweating.  Excessive urine output.  Fever. SYMPTOMS  Mild dehydration  Thirst.  Dry lips.  Slightly dry mouth. Moderate dehydration  Very dry mouth.  Sunken eyes.  Skin does not bounce back quickly when lightly pinched and released.  Dark urine and decreased urine production.  Decreased tear production.  Headache. Severe dehydration  Very dry mouth.  Extreme thirst.  Rapid, weak pulse (more than 100 beats per minute at rest).  Cold hands and feet.  Not able to sweat in spite of heat and temperature.  Rapid breathing.  Blue lips.  Confusion and lethargy.  Difficulty being awakened.  Minimal urine production.  No tears. DIAGNOSIS  Your caregiver will diagnose dehydration based on your symptoms and your exam. Blood and urine tests will help confirm the diagnosis. The diagnostic evaluation should also identify the cause of dehydration. TREATMENT  Treatment of mild or moderate dehydration can often be done at home by increasing the amount of fluids that you drink. It is best to drink small amounts of fluid more often. Drinking too much at one time can make vomiting worse. Refer to the home care instructions below. Severe dehydration needs to be treated at the hospital where you will probably be given intravenous (IV) fluids that contain water and electrolytes. HOME CARE INSTRUCTIONS   Ask your caregiver about specific rehydration instructions.  Drink enough fluids to keep your urine clear or pale yellow.  Drink small amounts frequently if you have nausea and vomiting.  Eat as you normally do.  Avoid:  Foods or drinks high in sugar.  Carbonated drinks.  Juice.  Extremely hot or cold fluids.  Drinks with caffeine.  Fatty, greasy foods.  Alcohol.  Tobacco.  Overeating.  Gelatin desserts.  Wash your hands well to avoid spreading bacteria and viruses.  Only take over-the-counter or prescription medicines for pain, discomfort, or fever as  directed by your caregiver.  Ask your caregiver if you should continue all prescribed and over-the-counter medicines.  Keep all follow-up appointments with your caregiver. SEEK MEDICAL CARE IF:  You have abdominal pain and it increases or stays in one area (localizes).  You have a rash, stiff neck, or severe headache.  You are irritable, sleepy, or difficult to awaken.  You are weak, dizzy, or extremely thirsty. SEEK IMMEDIATE MEDICAL CARE IF:   You are unable to keep fluids down or you get worse despite treatment.  You have frequent episodes of vomiting or diarrhea.  You have blood or green matter (bile) in your vomit.  You have blood in your stool or your stool looks black and tarry.  You have not urinated in 6 to 8 hours, or you have only urinated a small amount of very dark urine.  You have a fever.  You faint. MAKE SURE YOU:   Understand these instructions.  Will watch your condition.  Will get help right away if you are not doing well or get worse. Document Released: 05/28/2005 Document Revised: 08/20/2011 Document Reviewed: 01/15/2011 [facility] Patient Information 2015 [place], [place]. This information is not intended to replace advice given to you by your health care provider. Make sure  you discuss any questions you have with your health care provider.  Please use your miralax as prescribed twice a day until you have soft stools, then take it once a day. Please take your home pain medications as prescribed as needed for pain.

## 2014-07-25 ENCOUNTER — Other Ambulatory Visit: Payer: Self-pay | Admitting: Oncology

## 2014-07-27 ENCOUNTER — Ambulatory Visit: Payer: 59 | Admitting: Oncology

## 2014-07-27 ENCOUNTER — Other Ambulatory Visit: Payer: 59

## 2014-07-27 ENCOUNTER — Telehealth: Payer: Self-pay | Admitting: Oncology

## 2014-07-27 NOTE — Telephone Encounter (Signed)
Pt called to r/s due to no transportation and pt is sick, pt confirmed D/T and mailed out schedule..... KJ

## 2014-07-28 ENCOUNTER — Ambulatory Visit: Payer: 59

## 2014-07-28 ENCOUNTER — Encounter: Payer: 59 | Admitting: Nutrition

## 2014-08-04 ENCOUNTER — Other Ambulatory Visit: Payer: 59

## 2014-08-04 ENCOUNTER — Ambulatory Visit: Payer: 59 | Admitting: Nurse Practitioner

## 2014-08-05 ENCOUNTER — Telehealth: Payer: Self-pay

## 2014-08-05 ENCOUNTER — Telehealth: Payer: Self-pay | Admitting: Oncology

## 2014-08-05 NOTE — Telephone Encounter (Signed)
lvm for pt regarding to Feb appt °

## 2014-08-05 NOTE — Telephone Encounter (Signed)
Called pt regarding missed appt 08/04/14 with Ned Card. No answer. POF sent for reschedule on Monday 08/09/14 per Dr. Benay Spice.

## 2014-08-09 ENCOUNTER — Telehealth: Payer: Self-pay | Admitting: Oncology

## 2014-08-09 ENCOUNTER — Telehealth: Payer: Self-pay

## 2014-08-09 ENCOUNTER — Ambulatory Visit (HOSPITAL_BASED_OUTPATIENT_CLINIC_OR_DEPARTMENT_OTHER): Payer: 59 | Admitting: Nurse Practitioner

## 2014-08-09 ENCOUNTER — Ambulatory Visit: Payer: 59 | Admitting: Oncology

## 2014-08-09 ENCOUNTER — Ambulatory Visit: Payer: 59 | Admitting: Nutrition

## 2014-08-09 VITALS — BP 116/79 | HR 95 | Temp 98.3°F | Resp 18 | Ht 71.0 in

## 2014-08-09 DIAGNOSIS — G893 Neoplasm related pain (acute) (chronic): Secondary | ICD-10-CM

## 2014-08-09 DIAGNOSIS — C155 Malignant neoplasm of lower third of esophagus: Secondary | ICD-10-CM

## 2014-08-09 MED ORDER — MORPHINE SULFATE ER 30 MG PO TBCR: 30.0000 mg | EXTENDED_RELEASE_TABLET | Freq: Two times a day (BID) | ORAL | Status: DC

## 2014-08-09 MED ORDER — ONDANSETRON 8 MG PO TBDP: 8.0000 mg | ORAL_TABLET | Freq: Three times a day (TID) | ORAL | Status: AC | PRN

## 2014-08-09 MED ORDER — OXYCODONE HCL 5 MG PO TABS: 5.0000 mg | ORAL_TABLET | ORAL | Status: DC | PRN

## 2014-08-09 MED ORDER — POTASSIUM CHLORIDE CRYS ER 20 MEQ PO TBCR: 20.0000 meq | EXTENDED_RELEASE_TABLET | Freq: Every day | ORAL | Status: AC

## 2014-08-09 NOTE — Telephone Encounter (Signed)
pt can not come back and demanded to have doppler on same day .Marland Kitchen..gv and printed appt sched and avs for pt for March

## 2014-08-09 NOTE — Progress Notes (Signed)
Pt requested to speak with social worker today, left message with Abigail.

## 2014-08-09 NOTE — Progress Notes (Signed)
Brief follow-up with patient.  Patient reports he has not felt well today.  Weight continues to decline and was documented as 102 pounds.  Patient reports he is drinking to oral nutrition supplements daily but has run out.  Educated patient to increase oral nutrition supplements QID at a minimum.  Provided samples for him.  Encouraged patient to contact me with any further needs.

## 2014-08-09 NOTE — Telephone Encounter (Signed)
Brandon [name]. Pt has agreed to hospice. Another referral was made over the phone. They will be in contact with him this evening or tomorrow morning.

## 2014-08-09 NOTE — Progress Notes (Addendum)
[facility] OFFICE PROGRESS NOTE   Diagnosis:  Esophagus cancer  INTERVAL HISTORY:   Mr. Winterbottom returns after missing a follow-up visit last week. He continues to have upper abdominal pain mainly in the mornings. His main complaint today is a 2-3 week history of left lower leg pain. He takes oxycodone as needed. He denies significant back pain. No leg weakness or numbness. He continues to have dizziness. He is trying to eat. He reports he is taking in plenty of fluids. He denies dysphagia. No nausea or vomiting. Bowels are moving.  Objective:  Vital signs in last 24 hours:  Blood pressure 116/79, pulse 95, temperature 98.3 F (36.8 C), temperature source Oral, resp. rate 18, height 5\' 11"  (1.803 m), weight 0 lb (0 kg), SpO2 100 %.    HEENT: No thrush or ulcers. Resp: Distant breath sounds. Lungs are clear. Cardio: Regular rate and rhythm. GI: Abdomen is nontender. No hepatomegaly. The left upper abdomen has a full appearance. No discrete mass. Vascular: No leg edema. The left lower leg/foot is cool as compared to the right. Left toes are discolored. Capillary refill is decreased. Left posterior popliteal and dorsalis pedis pulses were not detected. Strong femoral pulse. Neuro: Leg strength is intact, 5 over 5.     Lab Results:  Lab Results  Component Value Date   WBC 15.4* 07/23/2014   HGB 10.0* 07/23/2014   HCT 29.4* 07/23/2014   MCV 94.2 07/23/2014   PLT 240 07/23/2014   NEUTROABS 13.3* 07/23/2014    Imaging:  No results found.  Medications: I have reviewed the patient's current medications.  Assessment/Plan: 1. 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  EGD by Dr. Collene Mares on 08/26/2013 with findings of a malignant appearing friable mass causing stenosis at 33 cm from the incisors extending to the GE junction. Stomach and the proximal small bowel were not visualized.   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   Staging PET scan on 09/03/2013 showed a 6.5 cm segment of intense hypermetabolic activity associated with the mid to distal esophagus with SUV max 24.5. There was no evidence of mediastinal metastasis, gastrohepatic ligament nodal metastasis or liver metastasis.   Initiation of radiation 09/14/2013 and concurrent weekly Taxol/carboplatin on 09/15/2013. Cycle 5 Taxol/carboplatin 10/12/2013. Radiation completed 10/26/2013.   Restaging CTs 11/10/2013 with no evidence of metastatic disease and improvement in the esophagus wall thickening.  CT abdomen/pelvis 05/12/2014 with new extensive lymphadenopathy in the upper abdomen.  Cycle 1 FOLFOX 06/01/2014  Cycle 2 FOLFOX 06/14/2014  CT angio chest/abdomen/pelvis ordered by Dr. Oneida Alar 06/24/2014 showed interval progression of upper abdominal confluent mesenteric adenopathy; retroperitoneal and retrocrural adenopathy stable.  Cycle 3 FOLFOX 06/28/2014 2. Dysphagia secondary to #1. Initially improved, recurrent dysphagia October 2015. Improved. 3. Weight loss secondary to #1. 4. Peripheral vascular disease. 5. History of mesenteric ischemia. 6. 4-5 mm hyperpigmented mole at the left upper back 7. Hypercalcemia status post Zometa 05/13/2014. 8. Persistent nausea and dizziness status post negative brain CT 07/06/2014. 9. Abdominal pain secondary to #1.   Disposition: Mr. Wasko performance status continues to decline. We again discussed a hospice referral. He is agreeable. We will contact the [facility] program. We discussed CODE STATUS. He is not ready to make a decision.  New prescriptions were issued today for MS Contin and oxycodone.  At today's visit he reported a 2-3 week history of left lower leg pain. On exam there is a temperature difference and  the pulses are diminished. We are referring him for an arterial doppler.  He will return for a follow-up visit in 2 weeks. He will contact the  office in the interim with any problems.  Patient seen with Dr. Benay Spice. 25 minutes were spent face-to-face at today's visit with the majority of that time involved in counseling/coordination of care.  Ned Card ANP/GNP-BC   08/09/2014  4:30 PM  This was a shared visit with Ned Card. Mr. Fredell was interviewed and examined. He continues to have pain secondary to recurrent esophagus cancer. We refilled the narcotic analgesics today. We are concerned he has developed left leg ischemia secondary to peripheral vascular disease or thromboembolism. He will be referred for an arterial Doppler of the left leg.  He agrees to a [facility] hospice referral.  Julieanne Manson, M.D.

## 2014-08-10 ENCOUNTER — Telehealth: Payer: Self-pay | Admitting: Medical Oncology

## 2014-08-10 NOTE — Telephone Encounter (Signed)
This nurse received call from Hospice requesting doppler results and the treatment plan.  Noted appointment scheduled for 08-23-2014.  No results at this time.

## 2014-08-10 NOTE — Telephone Encounter (Signed)
Called and spoke with Brandon [name] with [facility]. Pt doppler not scheduled until 08/23/14. Brandon [name] states she spoke with this patient about considering not getting the doppler done because there wouldn't be any interventions done. Informed Brandon [name] and Brandon [name], as pt attending Brandon [name] wants the doppler done because pt was experiencing pain and they can help control his pain. Called back and LM with the triage nurse to inform pt nurse Brandon [name].

## 2014-08-10 NOTE — Telephone Encounter (Signed)
Hospice wants doppler results and message cc Dr Carin Hock nurses.

## 2014-08-11 ENCOUNTER — Telehealth: Payer: Self-pay | Admitting: *Deleted

## 2014-08-11 NOTE — Telephone Encounter (Signed)
Santiago Glad, RN @ [facility] called reports "pt is very dizzy when he stands up; consider ordering Antivert?"  Santiago Glad states its due to "BP changes when he goes from sitting to standing; not drinking/eating well, Zofran does not help"  Per Dr. Benay Spice; instructed Santiago Glad to encourage pt to push fluids; Antivert not appropriate at this time.  Santiago Glad verbalized understanding of information.

## 2014-08-16 ENCOUNTER — Telehealth: Payer: Self-pay | Admitting: *Deleted

## 2014-08-16 DIAGNOSIS — C155 Malignant neoplasm of lower third of esophagus: Secondary | ICD-10-CM

## 2014-08-16 MED ORDER — MORPHINE SULFATE ER 30 MG PO TBCR: 60.0000 mg | EXTENDED_RELEASE_TABLET | Freq: Two times a day (BID) | ORAL | Status: DC

## 2014-08-16 NOTE — Telephone Encounter (Signed)
Message from [place], [place] with hospice reporting pt is requiring higher dose of long acting pain med. Returned call, she reports Dr. Lyman Speller increased MS Contin to 60 mg so she could instruct patient and family while in the home. Dr. Benay Spice made aware, verbal order given for hospice MD to manage pain meds.

## 2014-08-23 ENCOUNTER — Ambulatory Visit: Payer: 59 | Admitting: Nurse Practitioner

## 2014-08-23 ENCOUNTER — Ambulatory Visit (HOSPITAL_COMMUNITY): Payer: 59

## 2014-08-24 ENCOUNTER — Telehealth: Payer: Self-pay | Admitting: *Deleted

## 2014-08-24 NOTE — Telephone Encounter (Signed)
Called to speak to pt about no show for appt 3/14: Pt states that [facility] nurse called yesterday to cancel the appt. Pt states that she has advised him and his fiancee that he didn't have to go to the dr appts at this point if he did not want to. The pt has decided that he no longer wants to come in for his follow up appts since there is nothing further to do for him. "Even if there was a blood clot, there is nothing that they are going to do. There is no need to take the time to come there."

## 2014-08-25 ENCOUNTER — Telehealth: Payer: Self-pay | Admitting: *Deleted

## 2014-08-25 NOTE — Telephone Encounter (Signed)
Per Judeen Hammans, RN from [facility] reports update on pt:  "pt refused to go to doppler for right leg today, has not been out of the house, not eating, drinking Gatorade and smoking cigarettes, has not had a BM in 3-4 weeks and we have started him on Senna; refuses rectal exam and abd is flat, non-distended"  Also reports that son is becoming more involved with care.  Note to Dr. Benay Spice.

## 2014-08-27 ENCOUNTER — Telehealth: Payer: Self-pay | Admitting: *Deleted

## 2014-08-27 ENCOUNTER — Encounter: Payer: Self-pay | Admitting: Oncology

## 2014-08-27 NOTE — Progress Notes (Signed)
Spoke with Brandon [name] at hospice. She had left message about "green card". I advised her there is not green card and how the asst with [facility] works. She inquired about grant-that patient has. I advised her meds written by our drs only and 400.00

## 2014-08-27 NOTE — Telephone Encounter (Signed)
Brandon [name] with [facility] called to say that since Brandon [name] receives financial assistance for his medications through the [facility] patient assistance fund, Dr. Benay Spice will need to write all of his prescriptions until his Medicaid becomes active.

## 2014-08-30 ENCOUNTER — Telehealth: Payer: Self-pay | Admitting: *Deleted

## 2014-08-30 MED ORDER — LORAZEPAM 0.5 MG PO TABS: 0.5000 mg | ORAL_TABLET | Freq: Every evening | ORAL | Status: AC | PRN

## 2014-08-30 MED ORDER — ONDANSETRON HCL 8 MG PO TABS: 8.0000 mg | ORAL_TABLET | Freq: Three times a day (TID) | ORAL | Status: AC | PRN

## 2014-08-30 NOTE — Telephone Encounter (Signed)
Call from [place], [facility] RN reporting pt is not taking MS Contin 30 mg; it made him hallucinate. Taking Oxycodone 5/325 frequently. Judeen Hammans educated and arranged for his family to monitor his pain meds. He is to have up to 12 in 24 hours. (They believe he had taken #30 tablets in one day.)  Judeen Hammans is requesting an order for Ativan 0.5 mg. States pt is anxious at night. RN thinks it may be related to his drinking history, and that he is no longer drinking. Also needs Zofran refilled. Will review with Dr. Benay Spice for orders.

## 2014-08-31 ENCOUNTER — Emergency Department (HOSPITAL_COMMUNITY)
Admission: EM | Admit: 2014-08-31 | Discharge: 2014-09-01 | Disposition: A | Discharge status: Home / Self Care | Payer: 59 | Attending: Emergency Medicine | Admitting: Emergency Medicine

## 2014-08-31 ENCOUNTER — Encounter (HOSPITAL_COMMUNITY): Payer: Self-pay | Admitting: Emergency Medicine

## 2014-08-31 DIAGNOSIS — I739 Peripheral vascular disease, unspecified: Secondary | ICD-10-CM | POA: Diagnosis not present

## 2014-08-31 DIAGNOSIS — M79605 Pain in left leg: Secondary | ICD-10-CM | POA: Insufficient documentation

## 2014-08-31 DIAGNOSIS — Z720 Tobacco use: Secondary | ICD-10-CM | POA: Diagnosis not present

## 2014-08-31 DIAGNOSIS — Z79899 Other long term (current) drug therapy: Secondary | ICD-10-CM | POA: Insufficient documentation

## 2014-08-31 DIAGNOSIS — Z923 Personal history of irradiation: Secondary | ICD-10-CM | POA: Insufficient documentation

## 2014-08-31 DIAGNOSIS — Z8589 Personal history of malignant neoplasm of other organs and systems: Secondary | ICD-10-CM | POA: Insufficient documentation

## 2014-08-31 DIAGNOSIS — Z9889 Other specified postprocedural states: Secondary | ICD-10-CM | POA: Diagnosis not present

## 2014-08-31 MED ORDER — SODIUM CHLORIDE 0.9 % IV SOLN
INTRAVENOUS | Status: DC
  Administered 2014-09-01: via INTRAVENOUS

## 2014-08-31 MED ORDER — MORPHINE SULFATE 4 MG/ML IJ SOLN
4.0000 mg | Freq: Once | INTRAMUSCULAR | Status: AC
  Administered 2014-09-01: 4 mg via INTRAVENOUS
  Filled 2014-08-31: qty 1

## 2014-08-31 NOTE — ED Notes (Signed)
Pt comes via [facility] with left leg pain. Pt has hx of stomach and throat cancer. Pt has had pain in leg for about a month but began having gait difficulty yesterday due to pain. Pt rates pain 10/10. Pt has already taken home meds (15mg  morphine and 10mg  oxycodone).

## 2014-08-31 NOTE — ED Notes (Signed)
Doppler used for pt pedal pulses. Unable to locate pedal pulse in left foot.

## 2014-08-31 NOTE — ED Notes (Signed)
Bed: WA17 Expected date:  Expected time:  Means of arrival:  Comments: EMS cancer patient

## 2014-09-01 ENCOUNTER — Telehealth: Payer: Self-pay | Admitting: *Deleted

## 2014-09-01 LAB — COMPREHENSIVE METABOLIC PANEL
ALBUMIN: 3.2 g/dL — AB (ref 3.5–5.2)
ALK PHOS: 76 U/L (ref 39–117)
ALT: 8 U/L (ref 0–53)
AST: 15 U/L (ref 0–37)
Anion gap: 11 (ref 5–15)
BILIRUBIN TOTAL: 0.7 mg/dL (ref 0.3–1.2)
BUN: 22 mg/dL (ref 6–23)
CO2: 21 mmol/L (ref 19–32)
CREATININE: 0.66 mg/dL (ref 0.50–1.35)
Calcium: 10.5 mg/dL (ref 8.4–10.5)
Chloride: 105 mmol/L (ref 96–112)
GFR calc Af Amer: >90 mL/min (ref >90)
GFR calc non Af Amer: >90 mL/min (ref >90)
Glucose, Bld: 137 mg/dL — ABNORMAL HIGH (ref 70–99)
POTASSIUM: 3.6 mmol/L (ref 3.5–5.1)
Sodium: 137 mmol/L (ref 135–145)
Total Protein: 6.7 g/dL (ref 6.0–8.3)

## 2014-09-01 LAB — CBC WITH DIFFERENTIAL/PLATELET
BASOS PCT: 0 % (ref 0–1)
Basophils Absolute: 0 10*3/uL (ref 0.0–0.1)
EOS PCT: 0 % (ref 0–5)
Eosinophils Absolute: 0 10*3/uL (ref 0.0–0.7)
HCT: 32.9 % — ABNORMAL LOW (ref 39.0–52.0)
Hemoglobin: 10.9 g/dL — ABNORMAL LOW (ref 13.0–17.0)
Lymphocytes Relative: 2 % — ABNORMAL LOW (ref 12–46)
Lymphs Abs: 0.3 10*3/uL — ABNORMAL LOW (ref 0.7–4.0)
MCH: 31.8 pg (ref 26.0–34.0)
MCHC: 33.1 g/dL (ref 30.0–36.0)
MCV: 95.9 fL (ref 78.0–100.0)
MONO ABS: 0.4 10*3/uL (ref 0.1–1.0)
Monocytes Relative: 3 % (ref 3–12)
NEUTROS ABS: 14.5 10*3/uL — AB (ref 1.7–7.7)
NEUTROS PCT: 95 % — AB (ref 43–77)
PLATELETS: 268 10*3/uL (ref 150–400)
RBC: 3.43 MIL/uL — ABNORMAL LOW (ref 4.22–5.81)
RDW: 16.4 % — AB (ref 11.5–15.5)
WBC: 15.2 10*3/uL — ABNORMAL HIGH (ref 4.0–10.5)

## 2014-09-01 LAB — APTT: aPTT: 26 seconds (ref 24–37)

## 2014-09-01 LAB — PROTIME-INR
INR: 1.13 (ref 0.00–1.49)
Prothrombin Time: 14.7 seconds (ref 11.6–15.2)

## 2014-09-01 LAB — I-STAT CG4 LACTIC ACID, ED: LACTIC ACID, VENOUS: 1.79 mmol/L (ref 0.5–2.0)

## 2014-09-01 MED ORDER — OXYCODONE HCL 10 MG PO TABS: 10.0000 mg | ORAL_TABLET | ORAL | Status: AC | PRN

## 2014-09-01 MED ORDER — MORPHINE SULFATE 4 MG/ML IJ SOLN
4.0000 mg | Freq: Once | INTRAMUSCULAR | Status: AC
  Administered 2014-09-01: 4 mg via INTRAVENOUS
  Filled 2014-09-01: qty 1

## 2014-09-01 NOTE — ED Provider Notes (Signed)
CSN: [id number]     Arrival date & time 08/31/14  2211 History   First MD Initiated Contact with Patient 08/31/14 2314     Chief Complaint  Patient presents with  . Leg Pain     (Consider location/radiation/quality/duration/timing/severity/associated sxs/prior Treatment) HPI  Patient has stage IV esophageal cancer with history of mesenteric arterial insufficiency syndrome. He is status post bilateral femoropopliteal bypass about 14 years ago. He is complaining of left leg pain that started within the past few months. He reports is getting progressively worse over the past several weeks and got much worse today. He states today he has been unable to walk and he had to crawl on the floor to get to the bathroom.  PCP Dr Marcello Moores Oncologist Dr Benay Spice  Past Medical History  Diagnosis Date  . Cancer 08/26/13    esophageal path pending  . ETOH abuse   . Tobacco abuse   . 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 History of aorta-iliac-femoral bypass   . History of radiation therapy 09/14/13-10/26/13    esophageal ca/50.4Gy/34fx   Past Surgical History  Procedure Laterality Date  . Cardiac surgery      femoral bypass surgery  . Visceral angiogram N/A 11/25/2013    Procedure: VISCERAL ANGIOGRAM;  Surgeon: Serafina Mitchell, MD;  Location: [facility] CATH LAB;  Service: Cardiovascular;  Laterality: N/A;  . Esophagogastroduodenoscopy N/A 06/18/2014    Procedure: ESOPHAGOGASTRODUODENOSCOPY (EGD);  Surgeon: Beryle Beams, MD;  Location: [facility];  Service: Endoscopy;  Laterality: N/A;  . Balloon dilation N/A 06/18/2014    Procedure: BALLOON DILATION;  Surgeon: Beryle Beams, MD;  Location: [facility] ENDOS[facility];  Service: Endoscopy;  Laterality: N/A;   Family History  Problem Relation Age of Onset  . Stroke Mother    History  Substance Use Topics  . Smoking status: Current Every Day Smoker -- 1.00 packs/day for 40 years    Types: Cigarettes    Start date: 08/31/2013  .  Smokeless tobacco: Never Used     Comment: down to 1 cigarette daily  . Alcohol Use: Yes     Comment: 12 beer weekly  since last 3 months  lives at home Lives with [place]  in hospice care  Review of Systems  All other systems reviewed and are negative.     Allergies  Nsaids  Home Medications   Prior to Admission medications   Medication Sig Start Date End Date Taking? Authorizing Provider  [employer] SENNA PLUS 8.6-50 MG per tablet Take 2 tablets by mouth 2 (two) times daily. 08/11/14  Yes Historical Provider, MD  morphine (MS CONTIN) 30 MG 12 hr tablet Take 15-30 mg by mouth every 12 (twelve) hours.  08/15/14  Yes Historical Provider, MD  ondansetron (ZOFRAN ODT) 8 MG disintegrating tablet Take 1 tablet (8 mg total) by mouth every 8 (eight) hours as needed for nausea or vomiting. Patient taking differently: Take 8 mg by mouth 2 (two) times daily.  08/09/14  Yes Owens Shark, NP  oxyCODONE (OXY IR/ROXICODONE) 5 MG immediate release tablet Take 1-2 tablets (5-10 mg total) by mouth every 4 (four) hours as needed for severe pain. 08/09/14  Yes Owens Shark, NP  potassium chloride SA (K-DUR,KLOR-CON) 20 MEQ tablet Take 1 tablet (20 mEq total) by mouth daily. 08/09/14  Yes Owens Shark, NP  predniSONE (DELTASONE) 20 MG tablet Take 1 tablet (20 mg total) by mouth daily with breakfast. 07/14/14  Yes Lattie Haw  Verlene Mayer, NP  Sennosides (EX-LAX PO) Take 2 tablets by mouth 2 (two) times daily.    Yes Historical Provider, MD  HYDROcodone-acetaminophen (NORCO) 5-325 MG per tablet Take 1-2 tablets by mouth every 6 (six) hours as needed for moderate pain. Patient not taking: Reported on 08/09/2014 06/14/14   Owens Shark, NP  lidocaine-prilocaine (EMLA) cream Apply 1 application topically as needed. Apply approx 30 minutes prior to accessing port. Patient not taking: Reported on 08/09/2014 05/13/14   Susanne Borders, NP  LORazepam (ATIVAN) 0.5 MG tablet Take 1-2 tablets (0.5-1 mg total) by mouth at bedtime as needed for  anxiety or sleep. Patient not taking: Reported on 08/31/2014 08/30/14   Ladell Pier, MD  ondansetron (ZOFRAN) 8 MG tablet Take 1 tablet (8 mg total) by mouth every 8 (eight) hours as needed for nausea or vomiting. Patient not taking: Reported on 08/31/2014 08/30/14   Ladell Pier, MD  prochlorperazine (COMPAZINE) 10 MG tablet Take 1 tablet (10 mg total) by mouth every 6 (six) hours as needed for nausea or vomiting. Patient not taking: Reported on 08/09/2014 05/13/14   Susanne Borders, NP  promethazine (PHENERGAN) 25 MG tablet Take 0.5-1 tablets (12.5-25 mg total) by mouth every 6 (six) hours as needed for nausea or vomiting. Patient not taking: Reported on 08/09/2014 07/06/14   Owens Shark, NP  traMADol (ULTRAM) 50 MG tablet Take 1 tablet (50 mg total) by mouth every 6 (six) hours as needed. Patient not taking: Reported on 08/09/2014 06/01/14   Susanne Borders, NP   BP 139/90 mmHg  Pulse 105  Temp(Src) 97.7 F (36.5 C) (Oral)  Resp 16  SpO2 100%  Vital signs normal except for tachycardia  Physical Exam  Constitutional: He is oriented to person, place, and time.  Non-toxic appearance. He does not appear ill. No distress.  Cachectic male  HENT:  Head: Normocephalic and atraumatic.  Right Ear: External ear normal.  Left Ear: External ear normal.  Nose: Nose normal. No mucosal edema or rhinorrhea.  Mouth/Throat: Oropharynx is clear and moist and mucous membranes are normal. No dental abscesses or uvula swelling.  Eyes: Conjunctivae and EOM are normal. Pupils are equal, round, and reactive to light.  Neck: Normal range of motion and full passive range of motion without pain. Neck supple.  Cardiovascular: Normal rate, regular rhythm and normal heart sounds.  Exam reveals no gallop and no friction rub.   No murmur heard. Pulmonary/Chest: Effort normal and breath sounds normal. No respiratory distress. He has no wheezes. He has no rhonchi. He has no rales. He exhibits no tenderness and no  crepitus.  Abdominal: Soft. Normal appearance and bowel sounds are normal. He exhibits no distension. There is no tenderness. There is no rebound and no guarding.  Musculoskeletal: Normal range of motion. He exhibits no edema or tenderness.  Patient is noted to have a temperature change in his mid thigh distally with progressive coldness to the foot. Both feet are pale with delayed capillary refill. I am unable to palpate dorsalis pedis pulse. Nursing staff was unable to obtain dorsalis pedis pulse by Doppler. Patient's foot is not purple in coloration.  Neurological: He is alert and oriented to person, place, and time. He has normal strength. No cranial nerve deficit.  Skin: Skin is warm, dry and intact. No rash noted. No erythema. No pallor.  Psychiatric: He has a normal mood and affect. His speech is normal and behavior is normal. His mood appears not  anxious.  Nursing note and vitals reviewed.   ED Course  Procedures (including critical care time)  Medications  morphine 4 MG/ML injection 4 mg (4 mg Intravenous Given 09/01/14 0023)  morphine 4 MG/ML injection 4 mg (4 mg Intravenous Given 09/01/14 0127)   00:39 Dr Scot Dock, with a history patient has had chronic arterial insufficiency, wants to have patient call the office in the am and he will see him in the office today and get him scheduled for a arteriogram.    Labs Review Results for orders placed or performed during the hospital encounter of 08/31/14  Comprehensive metabolic panel  Result Value Ref Range   Sodium 137 135 - 145 mmol/L   Potassium 3.6 3.5 - 5.1 mmol/L   Chloride 105 96 - 112 mmol/L   CO2 21 19 - 32 mmol/L   Glucose, Bld 137 (H) 70 - 99 mg/dL   BUN 22 6 - 23 mg/dL   Creatinine, Ser 0.66 0.50 - 1.35 mg/dL   Calcium 10.5 8.4 - 10.5 mg/dL   Total Protein 6.7 6.0 - 8.3 g/dL   Albumin 3.2 (L) 3.5 - 5.2 g/dL   AST 15 0 - 37 U/L   ALT 8 0 - 53 U/L   Alkaline Phosphatase 76 39 - 117 U/L   Total Bilirubin 0.7 0.3 - 1.2  mg/dL   GFR calc non Af Amer >90 >90 mL/min   GFR calc Af Amer >90 >90 mL/min   Anion gap 11 5 - 15  CBC with Differential  Result Value Ref Range   WBC 15.2 (H) 4.0 - 10.5 K/uL   RBC 3.43 (L) 4.22 - 5.81 MIL/uL   Hemoglobin 10.9 (L) 13.0 - 17.0 g/dL   HCT 32.9 (L) 39.0 - 52.0 %   MCV 95.9 78.0 - 100.0 fL   MCH 31.8 26.0 - 34.0 pg   MCHC 33.1 30.0 - 36.0 g/dL   RDW 16.4 (H) 11.5 - 15.5 %   Platelets 268 150 - 400 K/uL   Neutrophils Relative % 95 (H) 43 - 77 %   Neutro Abs 14.5 (H) 1.7 - 7.7 K/uL   Lymphocytes Relative 2 (L) 12 - 46 %   Lymphs Abs 0.3 (L) 0.7 - 4.0 K/uL   Monocytes Relative 3 3 - 12 %   Monocytes Absolute 0.4 0.1 - 1.0 K/uL   Eosinophils Relative 0 0 - 5 %   Eosinophils Absolute 0.0 0.0 - 0.7 K/uL   Basophils Relative 0 0 - 1 %   Basophils Absolute 0.0 0.0 - 0.1 K/uL  APTT  Result Value Ref Range   aPTT 26 24 - 37 seconds  Protime-INR  Result Value Ref Range   Prothrombin Time 14.7 11.6 - 15.2 seconds   INR 1.13 0.00 - 1.49  I-Stat CG4 Lactic Acid, ED  Result Value Ref Range   Lactic Acid, Venous 1.79 0.5 - 2.0 mmol/L   Laboratory interpretation all normal except leukocytosis, mild anemia     Imaging Review No results found.   EKG Interpretation None      MDM   Final diagnoses:  Pain of left lower extremity  Arterial insufficiency of lower extremity    Plan discharge  Rolland Porter, MD, Barbette Or, MD 09/01/14 [phone]

## 2014-09-01 NOTE — Discharge Instructions (Signed)
Call Dr Nicole Cella office early this morning and he will see you in the office this morning to evaluate your leg and schedule an arteriogram of the blood vessels in your leg.

## 2014-09-01 NOTE — Telephone Encounter (Signed)
Call from [place], [facility] RN requesting to increase pt's Oxycodone dose to 10 mg. Will review with providers. She also reports pt did not follow up for doppler/ arteriogram due to being in the ED all night.

## 2014-09-01 NOTE — Telephone Encounter (Signed)
Narcotic Rx faxed to [facility]. Judeen Hammans, hospice RN made aware.

## 2014-09-06 ENCOUNTER — Telehealth: Payer: Self-pay | Admitting: *Deleted

## 2014-09-07 ENCOUNTER — Telehealth: Payer: Self-pay | Admitting: Oncology

## 2014-09-07 NOTE — Telephone Encounter (Signed)
Received death certificate 09/07/14 °

## 2014-09-08 ENCOUNTER — Telehealth: Payer: Self-pay | Admitting: Oncology

## 2014-09-08 NOTE — Telephone Encounter (Signed)
Received message from HIM - patient died 09/17/2014. Chart marked accordingly.

## 2014-09-10 NOTE — Telephone Encounter (Signed)
Judeen Hammans, RN from [facility] called to inform office pt's father found pt dead 09-14-2022 morning in the floor; EMS was activated and they pronounce him dead at home.  Dr. Benay Spice made aware.

## 2014-09-10 DEATH — deceased

## 2014-12-23 ENCOUNTER — Ambulatory Visit: Payer: 59 | Admitting: Vascular Surgery

## 2014-12-23 ENCOUNTER — Other Ambulatory Visit: Payer: 59

## 2015-02-03 ENCOUNTER — Other Ambulatory Visit: Payer: Self-pay | Admitting: Nurse Practitioner

## 2015-11-19 IMAGING — CT CT CTA ABD/PEL W/CM AND/OR W/O CM
1 of 7 series · 11 of 36 positions shown, 16 images · IV contrast (80CC OMNI 350)
Comparison: CT 06/18/14 and earlier studies

CLINICAL DATA: thoracic aneurysm. SOB, smoker. Chronic mid abd
pain, nausea, constipation and wt loss.Prior aortoiliac bypass surg
5339. Prior Bleu Eggers hernia repair.Hx esophageal [REDACTED]. Chemo and
radiation. Per pt, cancer has metastasized to his stomach. Started
chemo [DATE], most recent treatment last week. Port-a-cath placed
[DATE]. Esophageal dilatation [DATE].

EXAM:
CT ANGIOGRAPHY CHEST, ABDOMEN AND PELVIS
TECHNIQUE: Multidetector CT imaging through the chest, abdomen and pelvis was
performed using the standard protocol during bolus administration of
intravenous contrast. Multiplanar reconstructed images and MIPs were
obtained and reviewed to evaluate the vascular anatomy.
CONTRAST:  80mL OMNIPAQUE IOHEXOL 350 MG/ML SOLN

[Series 4: angio · axial · 0.74mm/px · z∈[-655,-45]mm · 11 of 280 slices shown, 16 images]
[im 18/280  soft-tissue]
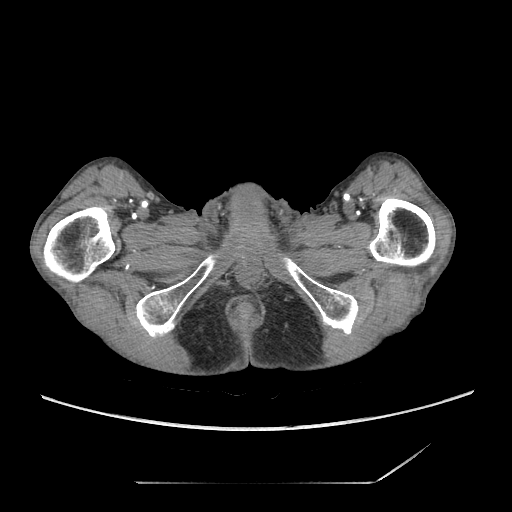
[im 18/280  bone]
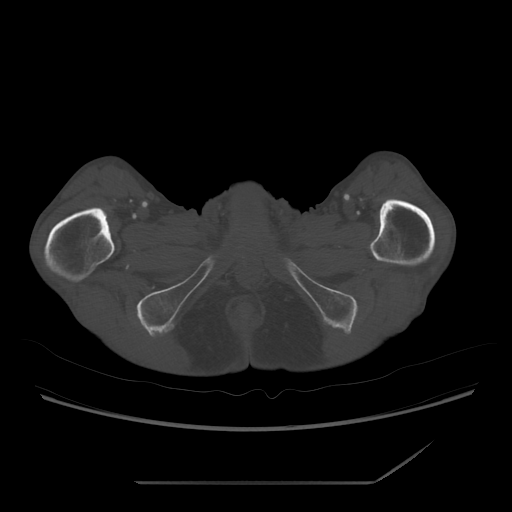
[im 53/280  soft-tissue]
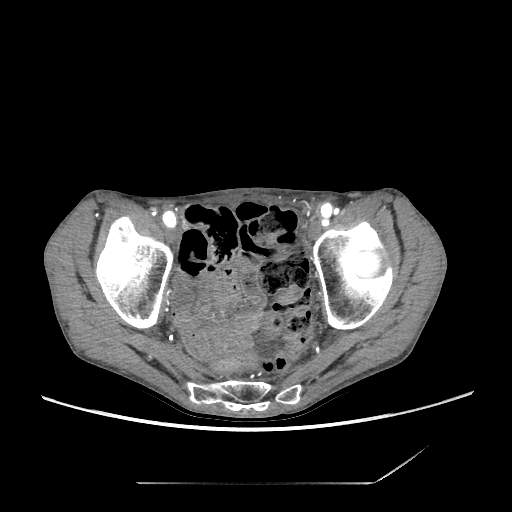
[im 70/280  soft-tissue]
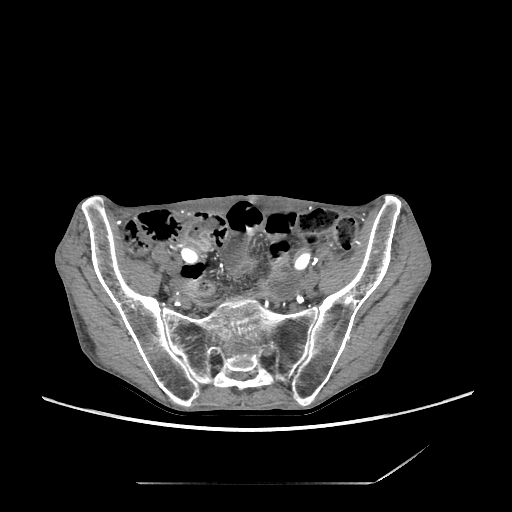
[im 105/280  soft-tissue]
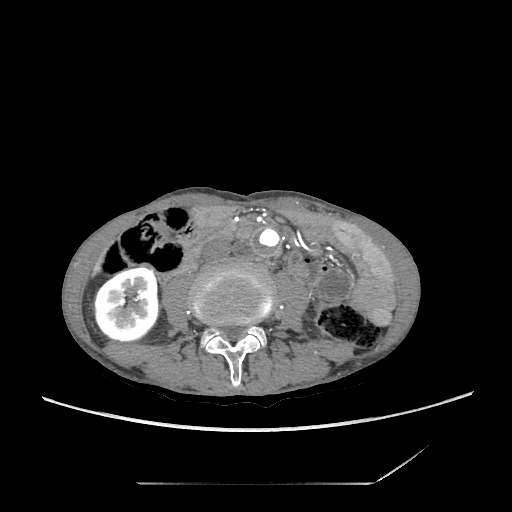
[im 123/280  soft-tissue]
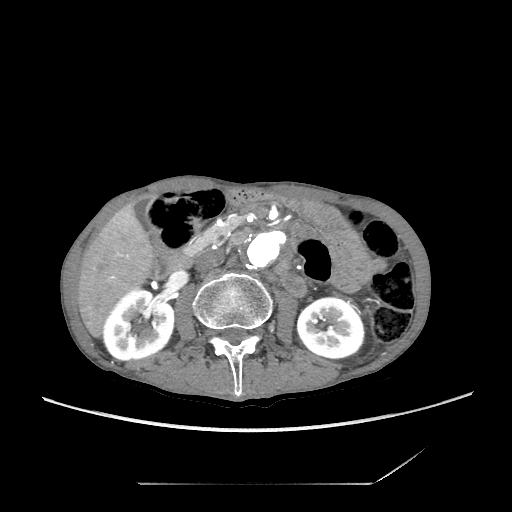
[im 157/280  soft-tissue]
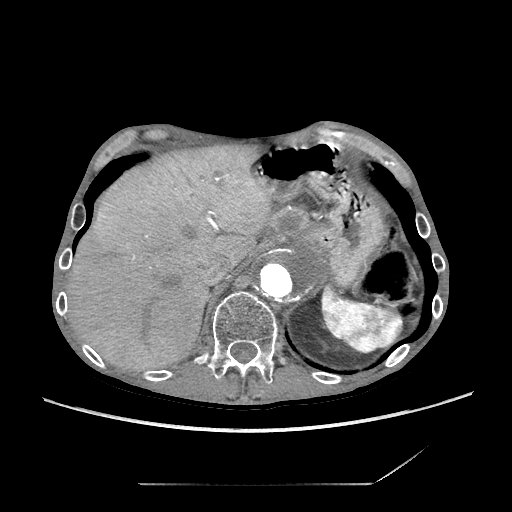
[im 175/280  soft-tissue]
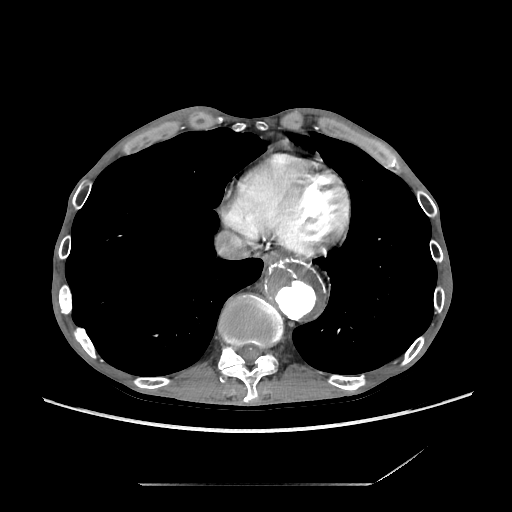
[im 210/280  soft-tissue]
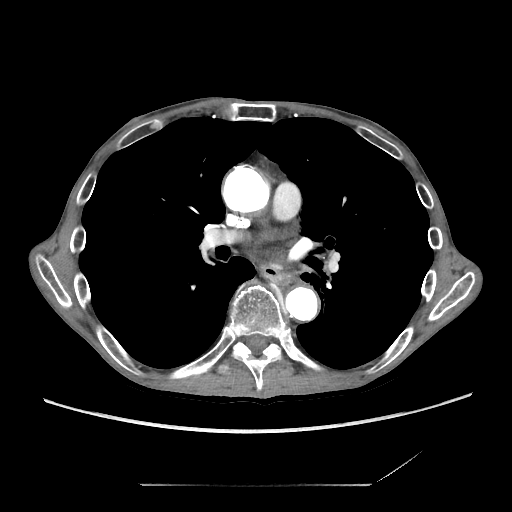
[im 210/280  lung]
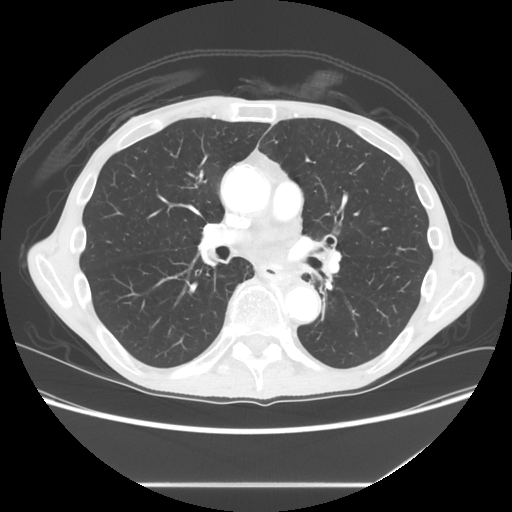
[im 227/280  soft-tissue]
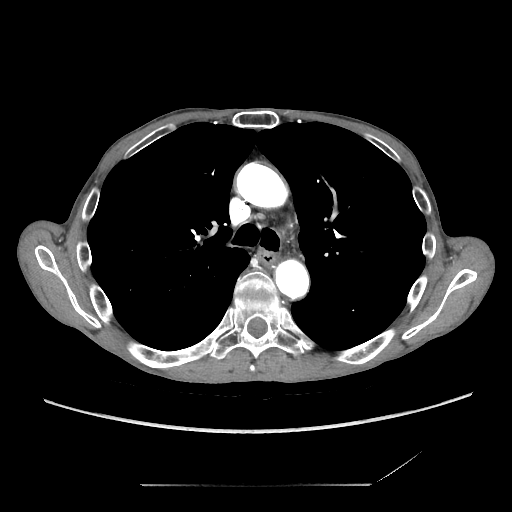
[im 227/280  lung]
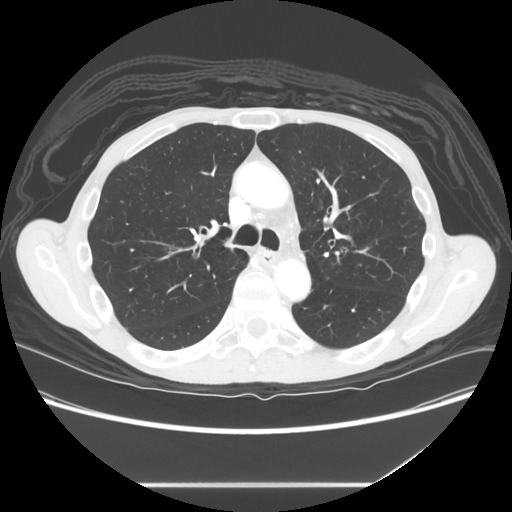
[im 227/280  bone]
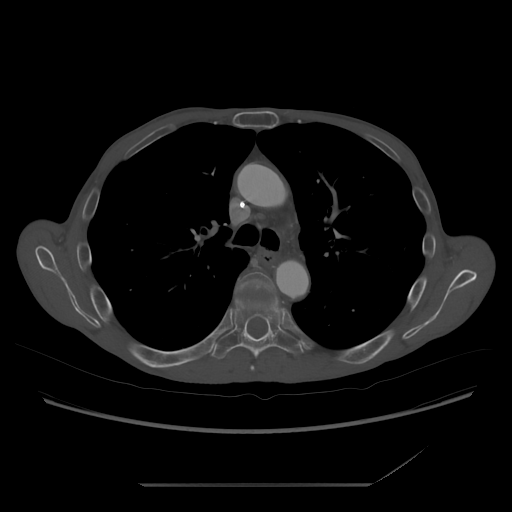
[im 245/280  lung]
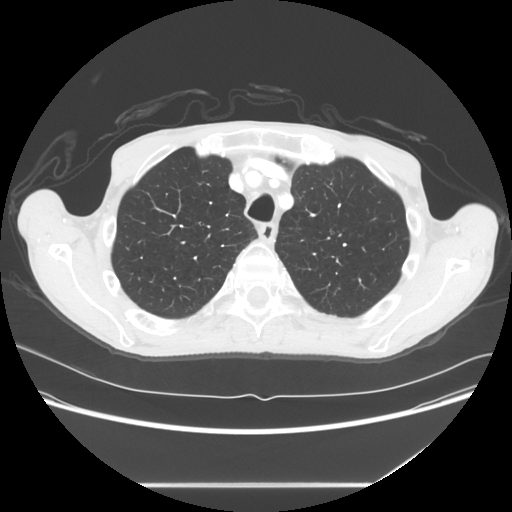
[im 262/280  soft-tissue]
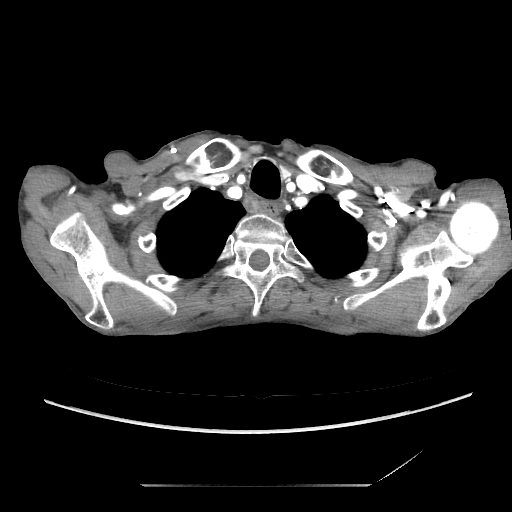
[im 262/280  lung]
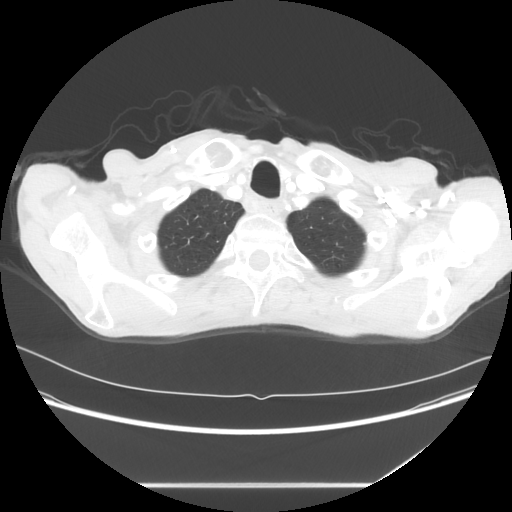

[11 of 36 positions shown; findings below may reference images not displayed]

FINDINGS: CTA CHEST FINDINGS

Right IJ port catheter extends to the distal SVC. SVC is patent.
Satisfactory opacification of pulmonary arteries noted, exam not
optimized for detection of pulmonary emboli. Patent superior and
inferior pulmonary veins bilaterally. Extensive coronary
calcifications.

Dilated aortic root, 4.2 cm diameter at the sinuses of Valsalva, 3
cm at the Haugthon junction, 3.4 cm mid ascending, 3 cm distal
ascending/proximal arch, 2.6 cm distal arch/proximal descending,
cm mid descending. Fusiform dilatation of the distal descending
thoracic segment measuring up to 6.1 cm diameter just above the
diaphragm (previously 5.6 cm), tapering to a diameter of 3.6 cm. No
dissection or stenosis. Classic 3 vessel brachiocephalic arterial
origin anatomy without proximal stenosis.

No pleural or pericardial effusion. Subcentimeter AP window and
precarinal lymph nodes. No hilar adenopathy. Lungs are clear.
Thoracic spine and sternum intact.

Review of the MIP images confirms the above findings.

CTA ABDOMEN AND PELVIS FINDINGS:

Arterial findings:

Aorta: Ectatic supraceliac segment measuring up to 3 cm diameter.
Patent infrarenal aortobifem graft with additional limb to the
distal SMA. There is occlusion of the native distal aorta. There is
some eccentric thrombus within the graft and both limbs without
significant stenosis.

Celiac axis: Origin occlusion over a length of at least 15 mm,
reconstituted distally by visceral collaterals.

Superior mesenteric: Moderately severe origin stenosis extending
over a length of approximately 13 mm. Patent graft to the distal
SMA.

Left renal: Mild origin stenosis extending over a length of
approximately 14 mm, patent distally.

Right renal:         Short segment origin stenosis, patent distally.

Inferior mesenteric: Origin occlusion, reconstituted distally by
visceral collaterals

Left iliac: Occlusion of the native common and external iliac
arteries. Reconstitution of internal iliac branches. Left limb of
the aortobifem graft with patent anastomosis to the common femoral
artery. There is scattered plaque in the proximal visualized SFA
resulting in short segment moderate stenosis, incompletely evaluated
distally.

Right iliac: Long segment occlusion of native common, external and
internal iliac arteries. Patent right graft limb to the common
femoral artery, anastomosis widely patent. Mild plaque in the
visualized proximal SFA with tandem mild stenoses.

Venous findings: Dedicated venous phase imaging not obtained. Patent
portal vein, splenic vein, superior mesenteric vein, bilateral renal
veins noted.

Review of the MIP images confirms the above findings.

Nonvascular findings: Unremarkable liver, nondilated gallbladder,
spleen, adrenal glands, kidneys. Bulky confluent mesenteric
adenopathy around the celiac axis and partially encasing the
proximal left gastric artery, progressive since prior study. Stable
right retrocrural and aortocaval adenopathy. Left para-aortic bulky
adenopathy stable.

Stomach, small bowel, and colon are nondilated. Urinary bladder
physiologically distended. Mild prostatic enlargement. No ascites.
No free air.
IMPRESSION: 1. Stable 4.2 cm dilatation of the aortic root.
2. Progressive dilatation of the 6.1 cm distal descending thoracic
aneurysm since 05/12/2014.
05/12/2014. Interval progression of upper abdominal confluent mesenteric
adenopathy since [DATE] . Retroperitoneal and retrocrural
adenopathy largely stable.
4. Continued patency of aortobifem graft with additional limb to the
SMA.
